# Patient Record
Sex: Female | Born: 1965 | Race: White | Hispanic: No | Smoking: Never smoker
Health system: Southern US, Community
[De-identification: ages and names within clinical notes are randomized; demographics above are authoritative.]

## PROBLEM LIST (undated history)

## (undated) DIAGNOSIS — E785 Hyperlipidemia, unspecified: Secondary | ICD-10-CM

## (undated) DIAGNOSIS — L709 Acne, unspecified: Secondary | ICD-10-CM

## (undated) DIAGNOSIS — T7840XA Allergy, unspecified, initial encounter: Secondary | ICD-10-CM

## (undated) HISTORY — DX: Acne, unspecified: L70.9

## (undated) HISTORY — DX: Allergy, unspecified, initial encounter: T78.40XA

## (undated) HISTORY — DX: Hyperlipidemia, unspecified: E78.5

---

## 1998-07-25 ENCOUNTER — Ambulatory Visit (HOSPITAL_COMMUNITY): Admission: RE | Admit: 1998-07-25 | Discharge: 1998-07-25 | Payer: Self-pay | Admitting: Family Medicine

## 1998-07-26 ENCOUNTER — Ambulatory Visit (HOSPITAL_COMMUNITY): Admission: RE | Admit: 1998-07-26 | Discharge: 1998-07-26 | Payer: Self-pay | Admitting: Obstetrics & Gynecology

## 1998-11-21 ENCOUNTER — Other Ambulatory Visit: Admission: RE | Admit: 1998-11-21 | Discharge: 1998-11-21 | Payer: Self-pay

## 1999-10-02 ENCOUNTER — Ambulatory Visit (HOSPITAL_COMMUNITY): Admission: RE | Admit: 1999-10-02 | Discharge: 1999-10-02 | Payer: Self-pay | Admitting: Family Medicine

## 1999-10-02 ENCOUNTER — Encounter: Payer: Self-pay | Admitting: Family Medicine

## 2000-01-31 ENCOUNTER — Inpatient Hospital Stay (HOSPITAL_COMMUNITY): Admission: AD | Admit: 2000-01-31 | Discharge: 2000-01-31 | Payer: Self-pay | Admitting: Family Medicine

## 2000-01-31 ENCOUNTER — Encounter: Payer: Self-pay | Admitting: Family Medicine

## 2000-02-18 ENCOUNTER — Inpatient Hospital Stay (HOSPITAL_COMMUNITY): Admission: AD | Admit: 2000-02-18 | Discharge: 2000-02-19 | Payer: Self-pay | Admitting: Family Medicine

## 2001-10-27 ENCOUNTER — Other Ambulatory Visit: Admission: RE | Admit: 2001-10-27 | Discharge: 2001-10-27 | Payer: Self-pay | Admitting: Family Medicine

## 2010-02-14 ENCOUNTER — Encounter: Payer: Self-pay | Admitting: Cardiology

## 2010-04-11 ENCOUNTER — Ambulatory Visit (HOSPITAL_COMMUNITY): Admission: RE | Admit: 2010-04-11 | Discharge: 2010-04-11 | Payer: Self-pay | Admitting: *Deleted

## 2010-04-11 ENCOUNTER — Encounter (INDEPENDENT_AMBULATORY_CARE_PROVIDER_SITE_OTHER): Payer: Self-pay | Admitting: *Deleted

## 2010-11-21 NOTE — Letter (Signed)
Summary: [facility]  [facility]   Imported By: Marylou Mccoy 05/17/2010 14:16:24  _____________________________________________________________________  External Attachment:    Type:   Image     Comment:   External Document

## 2010-11-21 NOTE — Letter (Signed)
Summary: Appointment - Missed  [facility]     [place], [place]    Phone:   Fax:      April 11, 2010 MRN: [id number]   Robin Harrington [address] [place], [place]  [address]   Dear Ms. [facility],  Our records indicate you missed your appointment on 03-20-2010  with Dr. Antoine Poche        It is very important that we reach you to reschedule this appointment. We look forward to participating in your health care needs. Please contact us at the number listed above at your earliest convenience to reschedule this appointment.     Sincerely,   Lorne Skeens  [facility] Scheduling Team

## 2014-07-29 ENCOUNTER — Other Ambulatory Visit: Payer: Self-pay | Admitting: Physician Assistant

## 2015-07-14 ENCOUNTER — Ambulatory Visit (INDEPENDENT_AMBULATORY_CARE_PROVIDER_SITE_OTHER): Payer: BC Managed Care – PPO

## 2015-07-14 DIAGNOSIS — Z23 Encounter for immunization: Secondary | ICD-10-CM

## 2016-09-05 ENCOUNTER — Ambulatory Visit (INDEPENDENT_AMBULATORY_CARE_PROVIDER_SITE_OTHER): Payer: BC Managed Care – PPO | Admitting: Physician Assistant

## 2016-09-05 ENCOUNTER — Encounter: Payer: Self-pay | Admitting: Physician Assistant

## 2016-09-05 VITALS — BP 110/72 | HR 50 | Temp 97.0°F | Ht 65.0 in | Wt 184.8 lb

## 2016-09-05 DIAGNOSIS — E782 Mixed hyperlipidemia: Secondary | ICD-10-CM | POA: Diagnosis not present

## 2016-09-05 DIAGNOSIS — L709 Acne, unspecified: Secondary | ICD-10-CM | POA: Diagnosis not present

## 2016-09-05 DIAGNOSIS — Z6830 Body mass index (BMI) 30.0-30.9, adult: Secondary | ICD-10-CM

## 2016-09-05 DIAGNOSIS — R635 Abnormal weight gain: Secondary | ICD-10-CM | POA: Diagnosis not present

## 2016-09-05 NOTE — Patient Instructions (Signed)
Acne Introduction Acne is a skin problem that causes small, red bumps (pimples). Acne happens when the tiny holes in your skin (pores) get blocked. Your pores may become red, sore, and swollen. They may also become infected. Acne is a common skin problem. It is especially common in teenagers. Acne usually goes away over time. Follow these instructions at home: Good skin care is the most important thing you can do to treat your acne. Take care of your skin as told by your doctor. You may be told to do these things:  Wash your skin gently at least two times each day. You should also wash your skin:  After you exercise.  Before you go to bed.  Use mild soap.  Use a water-based skin moisturizer after you wash your skin.  Use a sunscreen or sunblock with SPF 30 or greater. This is very important if you are using acne medicines.  Choose cosmetics that will not plug your oil glands (are noncomedogenic). Medicines  Take over-the-counter and prescription medicines only as told by your doctor.  If you were prescribed an antibiotic medicine, apply or take it as told by your doctor. Do not stop using the antibiotic even if your acne improves. General instructions  Keep your hair clean and off of your face. Shampoo your hair regularly. If you have oily hair, you may need to wash it every day.  Avoid leaning your chin or forehead on your hands.  Avoid wearing tight headbands or hats.  Avoid picking or squeezing your pimples. That can make your acne worse and cause scarring.  Keep all follow-up visits as told by your doctor. This is important.  Shave gently. Only shave when it is necessary.  Keep a food journal. This can help you to see if any foods are linked with your acne. Contact a doctor if:  Your acne is not better after eight weeks.  Your acne gets worse.  You have a large area of skin that is red or tender.  You think that you are having side effects from any acne  medicine. This information is not intended to replace advice given to you by your health care provider. Make sure you discuss any questions you have with your health care provider. Document Released: 09/25/2011 Document Revised: 03/13/2016 Document Reviewed: 12/13/2014  2017 Elsevier

## 2016-09-07 DIAGNOSIS — L709 Acne, unspecified: Secondary | ICD-10-CM | POA: Insufficient documentation

## 2016-09-07 DIAGNOSIS — E782 Mixed hyperlipidemia: Secondary | ICD-10-CM | POA: Insufficient documentation

## 2016-09-07 DIAGNOSIS — Z6830 Body mass index (BMI) 30.0-30.9, adult: Secondary | ICD-10-CM | POA: Insufficient documentation

## 2016-09-07 NOTE — Progress Notes (Signed)
BP 110/72   Pulse (!) 50   Temp 97 F (36.1 C) (Oral)   Ht 5\' 5"  (1.651 m)   Wt 184 lb 12.8 oz (83.8 kg)   BMI 30.75 kg/m    Subjective:    Patient ID: Robin Harrington, female    DOB: 06/04/1966, 50 y.o.   MRN: [id number]  HPI: Robin Harrington is a 50 y.o. female presenting on 09/05/2016 for Follow-up and Establish Care  Patient here to be [facility].  This patient [facility] from [facility]. At this time she is establishing care here and needing medications reviewed. She has been doing very well with her diet and exercise and losing weight. She does need her lipids rechecked soon we'll plan to do that in the near future in order will be placed. She is currently on Crestor 10 mg. Her GERD has been quite well-controlled also on the pantoprazole. She uses the minocycline which she has a flareup of her acne. She uses Flonase through the heavy allergy seasons  Relevant past medical, surgical, family and social history reviewed and updated as indicated. Allergies and medications reviewed and updated.  Past Medical History:  Diagnosis Date  . Acne   . Allergy   . Hyperlipidemia     History reviewed. No pertinent surgical history.  Review of Systems  Constitutional: Negative.  Negative for activity change, fatigue and fever.  HENT: Negative.   Eyes: Negative.   Respiratory: Negative.  Negative for cough.   Cardiovascular: Negative.  Negative for chest pain.  Gastrointestinal: Negative.  Negative for abdominal pain.  Endocrine: Negative.   Genitourinary: Negative.  Negative for dysuria.  Musculoskeletal: Negative.   Skin: Negative.   Neurological: Negative.       Medication List       Accurate as of 09/05/16 11:59 PM. Always use your most recent med list.          fluticasone 50 MCG/ACT nasal spray Commonly known as:  FLONASE Place into both nostrils daily.   minocycline 100 MG capsule Commonly  known as:  MINOCIN,DYNACIN Take 100 mg by mouth daily.   pantoprazole 40 MG tablet Commonly known as:  PROTONIX Take 40 mg by mouth 2 (two) times daily.   rosuvastatin 40 MG tablet Commonly known as:  CRESTOR Take 40 mg by mouth daily.          Objective:    BP 110/72   Pulse (!) 50   Temp 97 F (36.1 C) (Oral)   Ht 5\' 5"  (1.651 m)   Wt 184 lb 12.8 oz (83.8 kg)   BMI 30.75 kg/m   No Known Allergies  Physical Exam  Constitutional: She is oriented to person, place, and time. She appears well-developed and well-nourished.  HENT:  Head: Normocephalic and atraumatic.  Eyes: Conjunctivae and EOM are normal. Pupils are equal, round, and reactive to light.  Neck: Normal range of motion. Neck supple.  Cardiovascular: Normal rate, regular rhythm, normal heart sounds and intact distal pulses.   Pulmonary/Chest: Effort normal and breath sounds normal.  Abdominal: Soft. Bowel sounds are normal.  Neurological: She is alert and oriented to person, place, and time. She has normal reflexes.  Skin: Skin is warm and dry. No rash noted.  Psychiatric: She has a normal mood and affect. Her behavior is normal. Judgment and thought content normal.  Nursing note and vitals reviewed.   No results found for this or  any previous visit.    Assessment & Plan:   1. Mixed hyperlipidemia Crestor 10 mg one daily - Lipid panel; Future  2. Weight gain Diet and exercise doing well  3. Body mass index 30.0-30.9, adult  4. Acne, unspecified acne type Minocycline prn   Continue all other maintenance medications as listed above.  Follow up plan: Return in about 6 months (around 03/05/2017).  Orders Placed This Encounter  Procedures  . Lipid panel    Educational handout given for acne  Remus LofflerAngel S. Tuana Hoheisel PA-C [facility] [address]  [place], [place] [address] [phone]   09/07/2016, 7:51 PM

## 2016-09-08 ENCOUNTER — Telehealth: Payer: Self-pay | Admitting: Physician Assistant

## 2016-10-09 ENCOUNTER — Other Ambulatory Visit: Payer: BC Managed Care – PPO

## 2016-10-09 DIAGNOSIS — E782 Mixed hyperlipidemia: Secondary | ICD-10-CM

## 2016-10-10 LAB — LIPID PANEL
Chol/HDL Ratio: 3.1 ratio units (ref 0.0–4.4)
Cholesterol, Total: 222 mg/dL — ABNORMAL HIGH (ref 100–199)
HDL: 71 mg/dL (ref >39)
LDL Calculated: 129 mg/dL — ABNORMAL HIGH (ref 0–99)
Triglycerides: 112 mg/dL (ref 0–149)
VLDL Cholesterol Cal: 22 mg/dL (ref 5–40)

## 2016-12-02 ENCOUNTER — Telehealth: Payer: Self-pay | Admitting: Physician Assistant

## 2017-02-03 ENCOUNTER — Other Ambulatory Visit: Payer: Self-pay | Admitting: Physician Assistant

## 2017-04-10 ENCOUNTER — Other Ambulatory Visit: Payer: BC Managed Care – PPO

## 2017-04-10 DIAGNOSIS — E782 Mixed hyperlipidemia: Secondary | ICD-10-CM

## 2017-04-10 DIAGNOSIS — Z0000 Encounter for general adult medical examination without abnormal findings: Secondary | ICD-10-CM

## 2017-04-11 LAB — CMP14+EGFR
ALT: 11 IU/L (ref 0–32)
AST: 16 IU/L (ref 0–40)
Albumin/Globulin Ratio: 1.9 (ref 1.2–2.2)
Albumin: 4.3 g/dL (ref 3.5–5.5)
Alkaline Phosphatase: 59 IU/L (ref 39–117)
BUN/Creatinine Ratio: 14 (ref 9–23)
BUN: 12 mg/dL (ref 6–24)
Bilirubin Total: 0.3 mg/dL (ref 0.0–1.2)
CO2: 25 mmol/L (ref 20–29)
Calcium: 9.3 mg/dL (ref 8.7–10.2)
Chloride: 104 mmol/L (ref 96–106)
Creatinine, Ser: 0.88 mg/dL (ref 0.57–1.00)
GFR calc Af Amer: 88 mL/min/{1.73_m2} (ref >59)
GFR calc non Af Amer: 76 mL/min/{1.73_m2} (ref >59)
Globulin, Total: 2.3 g/dL (ref 1.5–4.5)
Glucose: 95 mg/dL (ref 65–99)
Potassium: 4.2 mmol/L (ref 3.5–5.2)
Sodium: 141 mmol/L (ref 134–144)
Total Protein: 6.6 g/dL (ref 6.0–8.5)

## 2017-04-11 LAB — CBC WITH DIFFERENTIAL/PLATELET
Basophils Absolute: 0 10*3/uL (ref 0.0–0.2)
Basos: 0 %
EOS (ABSOLUTE): 0.1 10*3/uL (ref 0.0–0.4)
Eos: 2 %
Hematocrit: 38.7 % (ref 34.0–46.6)
Hemoglobin: 12.7 g/dL (ref 11.1–15.9)
Immature Grans (Abs): 0 10*3/uL (ref 0.0–0.1)
Immature Granulocytes: 0 %
Lymphocytes Absolute: 1.6 10*3/uL (ref 0.7–3.1)
Lymphs: 38 %
MCH: 26.3 pg — ABNORMAL LOW (ref 26.6–33.0)
MCHC: 32.8 g/dL (ref 31.5–35.7)
MCV: 80 fL (ref 79–97)
Monocytes Absolute: 0.3 10*3/uL (ref 0.1–0.9)
Monocytes: 8 %
Neutrophils Absolute: 2.1 10*3/uL (ref 1.4–7.0)
Neutrophils: 52 %
Platelets: 259 10*3/uL (ref 150–379)
RBC: 4.82 x10E6/uL (ref 3.77–5.28)
RDW: 14.2 % (ref 12.3–15.4)
WBC: 4.1 10*3/uL (ref 3.4–10.8)

## 2017-04-11 LAB — LIPID PANEL
Chol/HDL Ratio: 2.5 ratio (ref 0.0–4.4)
Cholesterol, Total: 179 mg/dL (ref 100–199)
HDL: 71 mg/dL (ref >39)
LDL Calculated: 86 mg/dL (ref 0–99)
Triglycerides: 112 mg/dL (ref 0–149)
VLDL Cholesterol Cal: 22 mg/dL (ref 5–40)

## 2017-04-11 LAB — TSH: TSH: 3.02 u[IU]/mL (ref 0.450–4.500)

## 2017-04-17 ENCOUNTER — Encounter: Payer: Self-pay | Admitting: Physician Assistant

## 2017-04-17 ENCOUNTER — Ambulatory Visit (INDEPENDENT_AMBULATORY_CARE_PROVIDER_SITE_OTHER): Payer: BC Managed Care – PPO | Admitting: Physician Assistant

## 2017-04-17 VITALS — BP 117/80 | HR 56 | Temp 97.1°F | Ht 65.0 in | Wt 192.0 lb

## 2017-04-17 DIAGNOSIS — M26609 Unspecified temporomandibular joint disorder, unspecified side: Secondary | ICD-10-CM | POA: Diagnosis not present

## 2017-04-17 DIAGNOSIS — E782 Mixed hyperlipidemia: Secondary | ICD-10-CM | POA: Diagnosis not present

## 2017-04-17 DIAGNOSIS — Z6830 Body mass index (BMI) 30.0-30.9, adult: Secondary | ICD-10-CM | POA: Diagnosis not present

## 2017-04-17 DIAGNOSIS — M542 Cervicalgia: Secondary | ICD-10-CM

## 2017-04-17 MED ORDER — PHENTERMINE-TOPIRAMATE ER 7.5-46 MG PO CP24: 1.0000 | ORAL_CAPSULE | Freq: Every day | ORAL | 2 refills | Status: DC

## 2017-04-17 MED ORDER — CYCLOBENZAPRINE HCL 10 MG PO TABS: 10.0000 mg | ORAL_TABLET | Freq: Three times a day (TID) | ORAL | 1 refills | Status: DC | PRN

## 2017-04-17 NOTE — Patient Instructions (Signed)

## 2017-04-17 NOTE — Progress Notes (Signed)
BP 117/80   Pulse (!) 56   Temp 97.1 F (36.2 C) (Oral)   Ht '5\' 5"'$  (1.651 m)   Wt 192 lb (87.1 kg)   BMI 31.95 kg/m    Subjective:    Patient ID: Robin Harrington, female    DOB: [birth year], 51 y.o.   MRN: [id number]  HPI: Robin Harrington is a 51 y.o. female presenting on 04/17/2017 for Medication Refill  This patient comes in for periodic recheck on medications and conditions including Hyperlipidemia, weight gain, cervical pain, TMJ dysfunction. She is having more pain in her neck. 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 The pain is in the upper back. It hurts when she gets up in the morning. She has tried chiropractic and massage. She's not had a whole lot of relief. She has taken muscle relaxant in the past. She also has known TMJ dysfunction. All of her labs are reviewed today and are doing very well.   All medications are reviewed today. There are no reports of any problems with the medications. All of the medical conditions are reviewed and updated.  Lab work is reviewed and will be ordered as medically necessary. There are no new problems reported with today's visit.   Relevant past medical, surgical, family and social history reviewed and updated as indicated. Allergies and medications reviewed and updated.  Past Medical History:  Diagnosis Date  . Acne   . Allergy   . Hyperlipidemia     History reviewed. No pertinent surgical history.  Review of Systems  Constitutional: Negative.  Negative for activity change, fatigue and fever.  HENT: Negative.   Eyes: Negative.   Respiratory: Negative.  Negative for cough.   Cardiovascular: Negative.  Negative for chest pain.  Gastrointestinal: Negative.  Negative for abdominal pain.  Endocrine: Negative.   Genitourinary: Negative.  Negative for dysuria.  Musculoskeletal: Positive for arthralgias, joint swelling, neck pain and neck stiffness.  Skin: Negative.   Neurological:  Negative.     Allergies as of 04/17/2017   No Known Allergies     Medication List       Accurate as of 04/17/17 10:10 AM. Always use your most recent med list.          cyclobenzaprine 10 MG tablet Commonly known as:  FLEXERIL Take 1 tablet (10 mg total) by mouth 3 (three) times daily as needed for muscle spasms.   fluticasone 50 MCG/ACT nasal spray Commonly known as:  FLONASE Place into both nostrils daily.   pantoprazole 40 MG tablet Commonly known as:  PROTONIX Take 40 mg by mouth 2 (two) times daily.   Phentermine-Topiramate 7.5-46 MG Cp24 Commonly known as:  QSYMIA Take 1 tablet by mouth daily.   rosuvastatin 40 MG tablet Commonly known as:  CRESTOR TAKE ONE (1) TABLET EACH DAY          Objective:    BP 117/80   Pulse (!) 56   Temp 97.1 F (36.2 C) (Oral)   Ht '5\' 5"'$  (1.651 m)   Wt 192 lb (87.1 kg)   BMI 31.95 kg/m   No Known Allergies  Physical Exam  Constitutional: She is oriented to person, place, and time. She appears well-developed and well-nourished.  HENT:  Head: Normocephalic and atraumatic.  Right Ear: Tympanic membrane, external ear and ear canal normal.  Left Ear: Tympanic membrane, external ear and ear canal normal.  Nose: Nose normal. No rhinorrhea.  Mouth/Throat: Oropharynx  is clear and moist and mucous membranes are normal. No oropharyngeal exudate or posterior oropharyngeal erythema.  Eyes: Conjunctivae and EOM are normal. Pupils are equal, round, and reactive to light.  Neck: Normal range of motion. Neck supple.  Cardiovascular: Normal rate, regular rhythm, normal heart sounds and intact distal pulses.   Pulmonary/Chest: Effort normal and breath sounds normal.  Abdominal: Soft. Bowel sounds are normal.  Musculoskeletal: She exhibits tenderness.  Neurological: She is alert and oriented to person, place, and time. She has normal reflexes.  Skin: Skin is warm and dry. No rash noted.  Psychiatric: She has a normal mood and affect. Her  behavior is normal. Judgment and thought content normal.    Results for orders placed or performed in visit on 04/10/17  CBC with Differential/Platelet  Result Value Ref Range   WBC 4.1 3.4 - 10.8 x10E3/uL   RBC 4.82 3.77 - 5.28 x10E6/uL   Hemoglobin 12.7 11.1 - 15.9 g/dL   Hematocrit 50.5 39.7 - 46.6 %   MCV 80 79 - 97 fL   MCH 26.3 (L) 26.6 - 33.0 pg   MCHC 32.8 31.5 - 35.7 g/dL   RDW 67.3 41.9 - 37.9 %   Platelets 259 150 - 379 x10E3/uL   Neutrophils 52 Not Estab. %   Lymphs 38 Not Estab. %   Monocytes 8 Not Estab. %   Eos 2 Not Estab. %   Basos 0 Not Estab. %   Neutrophils Absolute 2.1 1.4 - 7.0 x10E3/uL   Lymphocytes Absolute 1.6 0.7 - 3.1 x10E3/uL   Monocytes Absolute 0.3 0.1 - 0.9 x10E3/uL   EOS (ABSOLUTE) 0.1 0.0 - 0.4 x10E3/uL   Basophils Absolute 0.0 0.0 - 0.2 x10E3/uL   Immature Granulocytes 0 Not Estab. %   Immature Grans (Abs) 0.0 0.0 - 0.1 x10E3/uL  CMP14+EGFR  Result Value Ref Range   Glucose 95 65 - 99 mg/dL   BUN 12 6 - 24 mg/dL   Creatinine, Ser 0.24 0.57 - 1.00 mg/dL   GFR calc non Af Amer 76 >59 mL/min/1.73   GFR calc Af Amer 88 >59 mL/min/1.73   BUN/Creatinine Ratio 14 9 - 23   Sodium 141 134 - 144 mmol/L   Potassium 4.2 3.5 - 5.2 mmol/L   Chloride 104 96 - 106 mmol/L   CO2 25 20 - 29 mmol/L   Calcium 9.3 8.7 - 10.2 mg/dL   Total Protein 6.6 6.0 - 8.5 g/dL   Albumin 4.3 3.5 - 5.5 g/dL   Globulin, Total 2.3 1.5 - 4.5 g/dL   Albumin/Globulin Ratio 1.9 1.2 - 2.2   Bilirubin Total 0.3 0.0 - 1.2 mg/dL   Alkaline Phosphatase 59 39 - 117 IU/L   AST 16 0 - 40 IU/L   ALT 11 0 - 32 IU/L  Lipid panel  Result Value Ref Range   Cholesterol, Total 179 100 - 199 mg/dL   Triglycerides 097 0 - 149 mg/dL   HDL 71 >35 mg/dL   VLDL Cholesterol Cal 22 5 - 40 mg/dL   LDL Calculated 86 0 - 99 mg/dL   Chol/HDL Ratio 2.5 0.0 - 4.4 ratio  TSH  Result Value Ref Range   TSH 3.020 0.450 - 4.500 uIU/mL      Assessment & Plan:   1. Mixed hyperlipidemia  2. Body  mass index 30.0-30.9, adult - Phentermine-Topiramate (QSYMIA) 7.5-46 MG CP24; Take 1 tablet by mouth daily.  Dispense: 30 capsule; Refill: 2  3. Cervical pain - cyclobenzaprine (FLEXERIL) 10 MG  tablet; Take 1 tablet (10 mg total) by mouth 3 (three) times daily as needed for muscle spasms.  Dispense: 90 tablet; Refill: 1  4. TMJ dysfunction - cyclobenzaprine (FLEXERIL) 10 MG tablet; Take 1 tablet (10 mg total) by mouth 3 (three) times daily as needed for muscle spasms.  Dispense: 90 tablet; Refill: 1   Current Outpatient Prescriptions:  .  fluticasone (FLONASE) 50 MCG/ACT nasal spray, Place into both nostrils daily., Disp: , Rfl:  .  pantoprazole (PROTONIX) 40 MG tablet, Take 40 mg by mouth 2 (two) times daily., Disp: , Rfl:  .  rosuvastatin (CRESTOR) 40 MG tablet, TAKE ONE (1) TABLET EACH DAY, Disp: 90 tablet, Rfl: 0 .  cyclobenzaprine (FLEXERIL) 10 MG tablet, Take 1 tablet (10 mg total) by mouth 3 (three) times daily as needed for muscle spasms., Disp: 90 tablet, Rfl: 1 .  Phentermine-Topiramate (QSYMIA) 7.5-46 MG CP24, Take 1 tablet by mouth daily., Disp: 30 capsule, Rfl: 2  Continue all other maintenance medications as listed above.  Follow up plan: Return in about 3 months (around 07/18/2017) for recheck.  Educational handout given for carb counting  Terald Sleeper PA-C [facility] [address]  [place], [place] [address] [phone]   04/17/2017, 10:10 AM

## 2017-07-21 ENCOUNTER — Telehealth: Payer: Self-pay | Admitting: Physician Assistant

## 2017-08-13 ENCOUNTER — Other Ambulatory Visit: Payer: Self-pay | Admitting: Physician Assistant

## 2017-08-13 DIAGNOSIS — M26609 Unspecified temporomandibular joint disorder, unspecified side: Secondary | ICD-10-CM

## 2017-08-13 DIAGNOSIS — M542 Cervicalgia: Secondary | ICD-10-CM

## 2017-09-18 ENCOUNTER — Other Ambulatory Visit: Payer: Self-pay | Admitting: Physician Assistant

## 2017-09-18 DIAGNOSIS — Z6830 Body mass index (BMI) 30.0-30.9, adult: Secondary | ICD-10-CM

## 2017-09-21 MED ORDER — PHENTERMINE-TOPIRAMATE 7.5-46 MG PO CP24
1.0000 | ORAL_CAPSULE | Freq: Every day | ORAL | 0 refills | Status: DC
Start: 2017-09-21 — End: 2017-10-08

## 2017-09-21 NOTE — Telephone Encounter (Signed)
rx called into pharmacy

## 2017-09-21 NOTE — Addendum Note (Signed)
Addended by: Remus LofflerJONES, Cornel Werber S on: 09/21/2017 11:10 AM   Modules accepted: Orders

## 2017-10-08 ENCOUNTER — Encounter: Payer: Self-pay | Admitting: Physician Assistant

## 2017-10-08 ENCOUNTER — Ambulatory Visit (INDEPENDENT_AMBULATORY_CARE_PROVIDER_SITE_OTHER): Payer: BC Managed Care – PPO | Admitting: Physician Assistant

## 2017-10-08 DIAGNOSIS — M26609 Unspecified temporomandibular joint disorder, unspecified side: Secondary | ICD-10-CM

## 2017-10-08 DIAGNOSIS — Z6830 Body mass index (BMI) 30.0-30.9, adult: Secondary | ICD-10-CM | POA: Diagnosis not present

## 2017-10-08 DIAGNOSIS — M542 Cervicalgia: Secondary | ICD-10-CM | POA: Diagnosis not present

## 2017-10-08 MED ORDER — PHENTERMINE-TOPIRAMATE 7.5-46 MG PO CP24
1.0000 | ORAL_CAPSULE | Freq: Every day | ORAL | 5 refills | Status: DC
Start: 2017-10-08 — End: 2018-03-16

## 2017-10-08 MED ORDER — PANTOPRAZOLE SODIUM 40 MG PO TBEC: 40.0000 mg | DELAYED_RELEASE_TABLET | Freq: Two times a day (BID) | ORAL | 3 refills | Status: DC

## 2017-10-08 MED ORDER — ROSUVASTATIN CALCIUM 40 MG PO TABS: ORAL_TABLET | ORAL | 3 refills | Status: DC

## 2017-10-08 MED ORDER — FLUTICASONE PROPIONATE 50 MCG/ACT NA SUSP: 2.0000 | Freq: Every day | NASAL | 11 refills | Status: DC

## 2017-10-08 MED ORDER — CYCLOBENZAPRINE HCL 10 MG PO TABS: 10.0000 mg | ORAL_TABLET | Freq: Three times a day (TID) | ORAL | 3 refills | Status: DC | PRN

## 2017-10-08 NOTE — Patient Instructions (Signed)
In a few days you may receive a survey in the mail or online from [employer] regarding your visit with us today. Please take a moment to fill this out. Your feedback is very important to our whole office. It can help us better understand your needs as well as improve your experience and satisfaction. Thank you for taking your time to complete it. We care about you.  [employer], PA-C  

## 2017-10-13 NOTE — Progress Notes (Signed)
BP (!) 95/58   Pulse (!) 55   Temp 97.6 F (36.4 C) (Oral)   Ht '5\' 5"'$  (1.651 m)   Wt 196 lb 9.6 oz (89.2 kg)   BMI 32.72 kg/m    Subjective:    Patient ID: Robin Harrington, female    DOB: April 28, 1966, 51 y.o.   MRN: [id number]  HPI: Robin Harrington is a 51 y.o. female presenting on 10/08/2017 for Medication Refill  This patient comes in for periodic recheck on medications and conditions including weight counsel, neck pain with TMJ dysfunction. Reports overall doing very well and tolerating all of her medications. Weight stable overall, and under a lot of stressors through work..   All medications are reviewed today. There are no reports of any problems with the medications. All of the medical conditions are reviewed and updated.  Lab work is reviewed and will be ordered as medically necessary. There are no new problems reported with today's visit.   Relevant past medical, surgical, family and social history reviewed and updated as indicated. Allergies and medications reviewed and updated.  Past Medical History:  Diagnosis Date  . Acne   . Allergy   . Hyperlipidemia     History reviewed. No pertinent surgical history.  Review of Systems  Constitutional: Negative.  Negative for activity change, fatigue and fever.  HENT: Negative.   Eyes: Negative.   Respiratory: Negative.  Negative for cough.   Cardiovascular: Negative.  Negative for chest pain.  Gastrointestinal: Negative.  Negative for abdominal pain.  Endocrine: Negative.   Genitourinary: Negative.  Negative for dysuria.  Musculoskeletal: Negative.   Skin: Negative.   Neurological: Negative.     Allergies as of 10/08/2017   No Known Allergies     Medication List        Accurate as of 10/08/17 11:59 PM. Always use your most recent med list.          cyclobenzaprine 10 MG tablet Commonly known as:  FLEXERIL Take 1 tablet (10 mg total) by mouth 3 (three) times daily as needed. for muscle spams     fluticasone 50 MCG/ACT nasal spray Commonly known as:  FLONASE Place 2 sprays into both nostrils daily.   pantoprazole 40 MG tablet Commonly known as:  PROTONIX Take 1 tablet (40 mg total) by mouth 2 (two) times daily.   Phentermine-Topiramate 7.5-46 MG Cp24 Commonly known as:  QSYMIA Take 1 capsule by mouth daily.   rosuvastatin 40 MG tablet Commonly known as:  CRESTOR TAKE ONE (1) TABLET EACH DAY          Objective:    BP (!) 95/58   Pulse (!) 55   Temp 97.6 F (36.4 C) (Oral)   Ht '5\' 5"'$  (1.651 m)   Wt 196 lb 9.6 oz (89.2 kg)   BMI 32.72 kg/m   No Known Allergies  Physical Exam  Constitutional: She is oriented to person, place, and time. She appears well-developed and well-nourished.  HENT:  Head: Normocephalic and atraumatic.  Right Ear: Tympanic membrane, external ear and ear canal normal.  Left Ear: Tympanic membrane, external ear and ear canal normal.  Nose: Nose normal. No rhinorrhea.  Mouth/Throat: Oropharynx is clear and moist and mucous membranes are normal. No oropharyngeal exudate or posterior oropharyngeal erythema.  Eyes: Conjunctivae and EOM are normal. Pupils are equal, round, and reactive to light.  Neck: Normal range of motion. Neck supple.  Cardiovascular: Normal rate, regular rhythm, normal heart sounds and intact distal pulses.  Pulmonary/Chest: Effort normal and breath sounds normal.  Abdominal: Soft. Bowel sounds are normal.  Neurological: She is alert and oriented to person, place, and time. She has normal reflexes.  Skin: Skin is warm and dry. No rash noted.  Psychiatric: She has a normal mood and affect. Her behavior is normal. Judgment and thought content normal.  Nursing note and vitals reviewed.   Results for orders placed or performed in visit on 04/10/17  CBC with Differential/Platelet  Result Value Ref Range   WBC 4.1 3.4 - 10.8 x10E3/uL   RBC 4.82 3.77 - 5.28 x10E6/uL   Hemoglobin 12.7 11.1 - 15.9 g/dL   Hematocrit 38.7 34.0  - 46.6 %   MCV 80 79 - 97 fL   MCH 26.3 (L) 26.6 - 33.0 pg   MCHC 32.8 31.5 - 35.7 g/dL   RDW 14.2 12.3 - 15.4 %   Platelets 259 150 - 379 x10E3/uL   Neutrophils 52 Not Estab. %   Lymphs 38 Not Estab. %   Monocytes 8 Not Estab. %   Eos 2 Not Estab. %   Basos 0 Not Estab. %   Neutrophils Absolute 2.1 1.4 - 7.0 x10E3/uL   Lymphocytes Absolute 1.6 0.7 - 3.1 x10E3/uL   Monocytes Absolute 0.3 0.1 - 0.9 x10E3/uL   EOS (ABSOLUTE) 0.1 0.0 - 0.4 x10E3/uL   Basophils Absolute 0.0 0.0 - 0.2 x10E3/uL   Immature Granulocytes 0 Not Estab. %   Immature Grans (Abs) 0.0 0.0 - 0.1 x10E3/uL  CMP14+EGFR  Result Value Ref Range   Glucose 95 65 - 99 mg/dL   BUN 12 6 - 24 mg/dL   Creatinine, Ser 0.88 0.57 - 1.00 mg/dL   GFR calc non Af Amer 76 >59 mL/min/1.73   GFR calc Af Amer 88 >59 mL/min/1.73   BUN/Creatinine Ratio 14 9 - 23   Sodium 141 134 - 144 mmol/L   Potassium 4.2 3.5 - 5.2 mmol/L   Chloride 104 96 - 106 mmol/L   CO2 25 20 - 29 mmol/L   Calcium 9.3 8.7 - 10.2 mg/dL   Total Protein 6.6 6.0 - 8.5 g/dL   Albumin 4.3 3.5 - 5.5 g/dL   Globulin, Total 2.3 1.5 - 4.5 g/dL   Albumin/Globulin Ratio 1.9 1.2 - 2.2   Bilirubin Total 0.3 0.0 - 1.2 mg/dL   Alkaline Phosphatase 59 39 - 117 IU/L   AST 16 0 - 40 IU/L   ALT 11 0 - 32 IU/L  Lipid panel  Result Value Ref Range   Cholesterol, Total 179 100 - 199 mg/dL   Triglycerides 112 0 - 149 mg/dL   HDL 71 >39 mg/dL   VLDL Cholesterol Cal 22 5 - 40 mg/dL   LDL Calculated 86 0 - 99 mg/dL   Chol/HDL Ratio 2.5 0.0 - 4.4 ratio  TSH  Result Value Ref Range   TSH 3.020 0.450 - 4.500 uIU/mL      Assessment & Plan:   1. Body mass index 30.0-30.9, adult - Phentermine-Topiramate (QSYMIA) 7.5-46 MG CP24; Take 1 capsule by mouth daily.  Dispense: 30 capsule; Refill: 5  2. Cervical pain - cyclobenzaprine (FLEXERIL) 10 MG tablet; Take 1 tablet (10 mg total) by mouth 3 (three) times daily as needed. for muscle spams  Dispense: 90 tablet; Refill: 3  3.  TMJ dysfunction - cyclobenzaprine (FLEXERIL) 10 MG tablet; Take 1 tablet (10 mg total) by mouth 3 (three) times daily as needed. for muscle spams  Dispense: 90 tablet; Refill: 3  Current Outpatient Medications:  .  cyclobenzaprine (FLEXERIL) 10 MG tablet, Take 1 tablet (10 mg total) by mouth 3 (three) times daily as needed. for muscle spams, Disp: 90 tablet, Rfl: 3 .  fluticasone (FLONASE) 50 MCG/ACT nasal spray, Place 2 sprays into both nostrils daily., Disp: 16 g, Rfl: 11 .  pantoprazole (PROTONIX) 40 MG tablet, Take 1 tablet (40 mg total) by mouth 2 (two) times daily., Disp: 90 tablet, Rfl: 3 .  Phentermine-Topiramate (QSYMIA) 7.5-46 MG CP24, Take 1 capsule by mouth daily., Disp: 30 capsule, Rfl: 5 .  rosuvastatin (CRESTOR) 40 MG tablet, TAKE ONE (1) TABLET EACH DAY, Disp: 90 tablet, Rfl: 3 Continue all other maintenance medications as listed above.  Follow up plan: Return in about 6 months (around 04/08/2018) for recheck.  Educational handout given for [place] PA-C [facility] [address]  [place], [place] [address] [phone]   10/13/2017, 9:43 PM

## 2018-01-04 ENCOUNTER — Telehealth: Payer: Self-pay | Admitting: Physician Assistant

## 2018-01-04 ENCOUNTER — Other Ambulatory Visit: Payer: Self-pay | Admitting: Physician Assistant

## 2018-01-04 MED ORDER — SULFAMETHOXAZOLE-TRIMETHOPRIM 800-160 MG PO TABS: 1.0000 | ORAL_TABLET | Freq: Two times a day (BID) | ORAL | 0 refills | Status: DC

## 2018-01-04 NOTE — Telephone Encounter (Signed)
Mom aware.

## 2018-01-04 NOTE — Telephone Encounter (Signed)
Bactrim DS 1 twice daily 1 week is sent to pharmacy

## 2018-03-04 ENCOUNTER — Ambulatory Visit (INDEPENDENT_AMBULATORY_CARE_PROVIDER_SITE_OTHER): Payer: BC Managed Care – PPO | Admitting: Physician Assistant

## 2018-03-04 ENCOUNTER — Encounter: Payer: Self-pay | Admitting: Physician Assistant

## 2018-03-04 ENCOUNTER — Ambulatory Visit: Payer: BC Managed Care – PPO | Admitting: Physician Assistant

## 2018-03-04 VITALS — BP 133/85 | HR 97 | Temp 97.9°F | Ht 65.0 in | Wt 197.0 lb

## 2018-03-04 DIAGNOSIS — J069 Acute upper respiratory infection, unspecified: Secondary | ICD-10-CM | POA: Diagnosis not present

## 2018-03-04 MED ORDER — AMOXICILLIN 875 MG PO TABS: 875.0000 mg | ORAL_TABLET | Freq: Two times a day (BID) | ORAL | 0 refills | Status: DC

## 2018-03-04 NOTE — Progress Notes (Signed)
  Subjective:     Patient ID: Robin Harrington, female   DOB: [birth year], 52 y.o.   MRN: [id number]  HPI Prod cough and head congestion for 3-4 days  Review of Systems  Constitutional: Positive for activity change, chills and fatigue. Negative for appetite change and fever.  HENT: Positive for congestion, postnasal drip, sinus pressure, sore throat and voice change. Negative for ear discharge, ear pain, nosebleeds, rhinorrhea and sinus pain.   Eyes: Negative.   Respiratory: Positive for cough. Negative for shortness of breath and wheezing.   Cardiovascular: Negative.        Objective:   Physical Exam  Constitutional: She appears well-developed and well-nourished.  HENT:  Right Ear: External ear normal.  Left Ear: External ear normal.  Mouth/Throat: Oropharynx is clear and moist. No oropharyngeal exudate.  Ears- cansl nl bilat TM's with fluids but nl landmarks bilat  Neck: Neck supple.  Cardiovascular: Normal rate, regular rhythm and normal heart sounds.  Pulmonary/Chest: Effort normal and breath sounds normal.  Coarse lung sounds that clear with cough  Lymphadenopathy:    She has no cervical adenopathy.  Nursing note and vitals reviewed.      Assessment:     1. Upper respiratory tract infection, unspecified type        Plan:     Amox  bid x 10 days Fluids  Rest OTC antihist /decongest F/U prn

## 2018-03-04 NOTE — Patient Instructions (Signed)
Upper Respiratory Infection, Adult Most upper respiratory infections (URIs) are caused by a virus. A URI affects the nose, throat, and upper air passages. The most common type of URI is often called "the common cold." Follow these instructions at home:  Take medicines only as told by your doctor.  Gargle warm saltwater or take cough drops to comfort your throat as told by your doctor.  Use a warm mist humidifier or inhale steam from a shower to increase air moisture. This may make it easier to breathe.  Drink enough fluid to keep your pee (urine) clear or pale yellow.  Eat soups and other clear broths.  Have a healthy diet.  Rest as needed.  Go back to work when your fever is gone or your doctor says it is okay. ? You may need to stay home longer to avoid giving your URI to others. ? You can also wear a face mask and wash your hands often to prevent spread of the virus.  Use your inhaler more if you have asthma.  Do not use any tobacco products, including cigarettes, chewing tobacco, or electronic cigarettes. If you need help quitting, ask your doctor. Contact a doctor if:  You are getting worse, not better.  Your symptoms are not helped by medicine.  You have chills.  You are getting more short of breath.  You have brown or red mucus.  You have yellow or brown discharge from your nose.  You have pain in your face, especially when you bend forward.  You have a fever.  You have puffy (swollen) neck glands.  You have pain while swallowing.  You have white areas in the back of your throat. Get help right away if:  You have very bad or constant: ? Headache. ? Ear pain. ? Pain in your forehead, behind your eyes, and over your cheekbones (sinus pain). ? Chest pain.  You have long-lasting (chronic) lung disease and any of the following: ? Wheezing. ? Long-lasting cough. ? Coughing up blood. ? A change in your usual mucus.  You have a stiff neck.  You have  changes in your: ? Vision. ? Hearing. ? Thinking. ? Mood. This information is not intended to replace advice given to you by your health care provider. Make sure you discuss any questions you have with your health care provider. Document Released: 03/24/2008 Document Revised: 06/08/2016 Document Reviewed: 01/11/2014 Elsevier Interactive Patient Education  2018 Elsevier Inc.  

## 2018-03-08 ENCOUNTER — Other Ambulatory Visit: Payer: Self-pay | Admitting: Physician Assistant

## 2018-03-08 ENCOUNTER — Telehealth: Payer: Self-pay | Admitting: Physician Assistant

## 2018-03-08 MED ORDER — SULFAMETHOXAZOLE-TRIMETHOPRIM 800-160 MG PO TABS: 1.0000 | ORAL_TABLET | Freq: Two times a day (BID) | ORAL | 0 refills | Status: DC

## 2018-03-08 NOTE — Telephone Encounter (Signed)
Please review and advise.

## 2018-03-08 NOTE — Telephone Encounter (Signed)
Left detailed message on patients voicemail that rx sent to pharmacy 

## 2018-03-08 NOTE — Telephone Encounter (Signed)
I have [facility]rim [facility]

## 2018-03-16 ENCOUNTER — Ambulatory Visit: Payer: BC Managed Care – PPO | Admitting: Physician Assistant

## 2018-03-16 ENCOUNTER — Encounter: Payer: Self-pay | Admitting: Physician Assistant

## 2018-03-16 VITALS — BP 108/75 | HR 61 | Temp 97.6°F | Ht 65.0 in | Wt 197.0 lb

## 2018-03-16 DIAGNOSIS — M26609 Unspecified temporomandibular joint disorder, unspecified side: Secondary | ICD-10-CM

## 2018-03-16 DIAGNOSIS — J321 Chronic frontal sinusitis: Secondary | ICD-10-CM | POA: Diagnosis not present

## 2018-03-16 DIAGNOSIS — Z6830 Body mass index (BMI) 30.0-30.9, adult: Secondary | ICD-10-CM

## 2018-03-16 DIAGNOSIS — M542 Cervicalgia: Secondary | ICD-10-CM

## 2018-03-16 DIAGNOSIS — Z0000 Encounter for general adult medical examination without abnormal findings: Secondary | ICD-10-CM

## 2018-03-16 MED ORDER — PHENTERMINE-TOPIRAMATE ER 7.5-46 MG PO CP24: 1.0000 | ORAL_CAPSULE | Freq: Every day | ORAL | 5 refills | Status: DC

## 2018-03-16 MED ORDER — SULFAMETHOXAZOLE-TRIMETHOPRIM 800-160 MG PO TABS: 1.0000 | ORAL_TABLET | Freq: Two times a day (BID) | ORAL | 0 refills | Status: DC

## 2018-03-16 MED ORDER — CYCLOBENZAPRINE HCL 10 MG PO TABS: 10.0000 mg | ORAL_TABLET | Freq: Three times a day (TID) | ORAL | 3 refills | Status: DC | PRN

## 2018-03-16 MED ORDER — PREDNISONE 10 MG (21) PO TBPK: ORAL_TABLET | ORAL | 0 refills | Status: DC

## 2018-03-16 NOTE — Progress Notes (Signed)
BP 108/75   Pulse 61   Temp 97.6 F (36.4 C)   Ht '5\' 5"'$  (1.651 m)   Wt 197 lb (89.4 kg)   BMI 32.78 kg/m    Subjective:    Patient ID: Robin Harrington, female    DOB: 11-Nov-1965, 52 y.o.   MRN: [id number]  HPI: Robin Harrington is a 52 y.o. female presenting on 03/16/2018 for Follow-up  This patient comes in for chronic recheck on her chronic medical conditions which includes TMJ dysfunction, cervical pain, hyperlipidemia.  She is doing very well and her tolerating her medications very well.  She has been having more sinus infections over the past few months.  She is taking some medicine did not completely felt better.  This patient has had many days of sinus headache and postnasal drainage. There is copious drainage at times. Denies any fever at this time. There has been a history of sinus infections in the past.  No history of sinus surgery. There is cough at night. It has become more prevalent in recent days.   Past Medical History:  Diagnosis Date  . Acne   . Allergy   . Hyperlipidemia    Relevant past medical, surgical, family and social history reviewed and updated as indicated. Interim medical history since our last visit reviewed. Allergies and medications reviewed and updated. DATA REVIEWED: CHART IN EPIC  Family History reviewed for pertinent findings.  Review of Systems  Constitutional: Positive for fatigue. Negative for activity change, appetite change, chills and fever.  HENT: Positive for congestion, postnasal drip, sinus pressure, sinus pain and sore throat.   Eyes: Negative.   Respiratory: Negative for cough and wheezing.   Cardiovascular: Negative.  Negative for chest pain, palpitations and leg swelling.  Gastrointestinal: Negative.   Genitourinary: Negative.   Musculoskeletal: Negative.   Skin: Negative.   Neurological: Positive for headaches.    Allergies as of 03/16/2018   No Known Allergies     Medication List        Accurate as of 03/16/18   9:33 AM. Always use your most recent med list.          cyclobenzaprine 10 MG tablet Commonly known as:  FLEXERIL Take 1 tablet (10 mg total) by mouth 3 (three) times daily as needed. for muscle spams   fluticasone 50 MCG/ACT nasal spray Commonly known as:  FLONASE Place 2 sprays into both nostrils daily.   pantoprazole 40 MG tablet Commonly known as:  PROTONIX Take 1 tablet (40 mg total) by mouth 2 (two) times daily.   Phentermine-Topiramate 7.5-46 MG Cp24 Commonly known as:  QSYMIA Take 1 capsule by mouth daily.   predniSONE 10 MG (21) Tbpk tablet Commonly known as:  STERAPRED UNI-PAK 21 TAB As directed x 6 days   rosuvastatin 40 MG tablet Commonly known as:  CRESTOR TAKE ONE (1) TABLET EACH DAY   sulfamethoxazole-trimethoprim 800-160 MG tablet Commonly known as:  BACTRIM DS,SEPTRA DS Take 1 tablet by mouth 2 (two) times daily.          Objective:    BP 108/75   Pulse 61   Temp 97.6 F (36.4 C)   Ht '5\' 5"'$  (1.651 m)   Wt 197 lb (89.4 kg)   BMI 32.78 kg/m   No Known Allergies  Wt Readings from Last 3 Encounters:  03/16/18 197 lb (89.4 kg)  03/04/18 197 lb (89.4 kg)  10/08/17 196 lb 9.6 oz (89.2 kg)    Physical Exam  Constitutional: She is oriented to person, place, and time. She appears well-developed and well-nourished.  HENT:  Head: Normocephalic and atraumatic.  Right Ear: Tympanic membrane and external ear normal. No middle ear effusion.  Left Ear: Tympanic membrane and external ear normal.  No middle ear effusion.  Nose: Mucosal edema and rhinorrhea present. Right sinus exhibits no maxillary sinus tenderness. Left sinus exhibits no maxillary sinus tenderness.  Mouth/Throat: Uvula is midline. Posterior oropharyngeal erythema present.  Eyes: Pupils are equal, round, and reactive to light. Conjunctivae and EOM are normal. Right eye exhibits no discharge. Left eye exhibits no discharge.  Neck: Normal range of motion.  Cardiovascular: Normal rate,  regular rhythm and normal heart sounds.  Pulmonary/Chest: Effort normal and breath sounds normal. No respiratory distress. She has no wheezes.  Abdominal: Soft.  Lymphadenopathy:    She has no cervical adenopathy.  Neurological: She is alert and oriented to person, place, and time.  Skin: Skin is warm and dry.  Psychiatric: She has a normal mood and affect.        Assessment & Plan:   1. Cervical pain - cyclobenzaprine (FLEXERIL) 10 MG tablet; Take 1 tablet (10 mg total) by mouth 3 (three) times daily as needed. for muscle spams  Dispense: 90 tablet; Refill: 3  2. TMJ dysfunction - cyclobenzaprine (FLEXERIL) 10 MG tablet; Take 1 tablet (10 mg total) by mouth 3 (three) times daily as needed. for muscle spams  Dispense: 90 tablet; Refill: 3  3. Body mass index 30.0-30.9, adult - Phentermine-Topiramate (QSYMIA) 7.5-46 MG CP24; Take 1 capsule by mouth daily.  Dispense: 30 capsule; Refill: 5  4. Chronic frontal sinusitis - sulfamethoxazole-trimethoprim (BACTRIM DS,SEPTRA DS) 800-160 MG tablet; Take 1 tablet by mouth 2 (two) times daily.  Dispense: 40 tablet; Refill: 0 - predniSONE (STERAPRED UNI-PAK 21 TAB) 10 MG (21) TBPK tablet; As directed x 6 days  Dispense: 21 tablet; Refill: 0  5. Well adult exam - CBC with Differential/Platelet - CMP14+EGFR - Lipid panel - TSH   Continue all other maintenance medications as listed above.  Follow up plan: Return in about 6 months (around 09/16/2018).  Educational handout given for Barre PA-C [place] [address]  [place], [place] [address] [phone]   03/16/2018, 9:33 AM

## 2018-03-17 ENCOUNTER — Other Ambulatory Visit: Payer: Self-pay | Admitting: Physician Assistant

## 2018-03-17 DIAGNOSIS — Z6830 Body mass index (BMI) 30.0-30.9, adult: Secondary | ICD-10-CM

## 2018-03-17 LAB — CMP14+EGFR
ALT: 13 IU/L (ref 0–32)
AST: 16 IU/L (ref 0–40)
Albumin/Globulin Ratio: 1.8 (ref 1.2–2.2)
Albumin: 4.2 g/dL (ref 3.5–5.5)
Alkaline Phosphatase: 61 IU/L (ref 39–117)
BUN/Creatinine Ratio: 14 (ref 9–23)
BUN: 11 mg/dL (ref 6–24)
Bilirubin Total: 0.2 mg/dL (ref 0.0–1.2)
CO2: 25 mmol/L (ref 20–29)
Calcium: 9.3 mg/dL (ref 8.7–10.2)
Chloride: 99 mmol/L (ref 96–106)
Creatinine, Ser: 0.76 mg/dL (ref 0.57–1.00)
GFR calc Af Amer: 104 mL/min/{1.73_m2} (ref >59)
GFR calc non Af Amer: 90 mL/min/{1.73_m2} (ref >59)
Globulin, Total: 2.4 g/dL (ref 1.5–4.5)
Glucose: 97 mg/dL (ref 65–99)
Potassium: 4.3 mmol/L (ref 3.5–5.2)
Sodium: 141 mmol/L (ref 134–144)
Total Protein: 6.6 g/dL (ref 6.0–8.5)

## 2018-03-17 LAB — CBC WITH DIFFERENTIAL/PLATELET
Basophils Absolute: 0 10*3/uL (ref 0.0–0.2)
Basos: 1 %
EOS (ABSOLUTE): 0.1 10*3/uL (ref 0.0–0.4)
Eos: 2 %
Hematocrit: 38.2 % (ref 34.0–46.6)
Hemoglobin: 12.8 g/dL (ref 11.1–15.9)
Immature Grans (Abs): 0 10*3/uL (ref 0.0–0.1)
Immature Granulocytes: 0 %
Lymphocytes Absolute: 1.6 10*3/uL (ref 0.7–3.1)
Lymphs: 44 %
MCH: 26.8 pg (ref 26.6–33.0)
MCHC: 33.5 g/dL (ref 31.5–35.7)
MCV: 80 fL (ref 79–97)
Monocytes Absolute: 0.3 10*3/uL (ref 0.1–0.9)
Monocytes: 8 %
Neutrophils Absolute: 1.6 10*3/uL (ref 1.4–7.0)
Neutrophils: 45 %
Platelets: 333 10*3/uL (ref 150–450)
RBC: 4.77 x10E6/uL (ref 3.77–5.28)
RDW: 14.1 % (ref 12.3–15.4)
WBC: 3.6 10*3/uL (ref 3.4–10.8)

## 2018-03-17 LAB — LIPID PANEL
Chol/HDL Ratio: 4.7 ratio — ABNORMAL HIGH (ref 0.0–4.4)
Cholesterol, Total: 297 mg/dL — ABNORMAL HIGH (ref 100–199)
HDL: 63 mg/dL (ref >39)
LDL Calculated: 205 mg/dL — ABNORMAL HIGH (ref 0–99)
Triglycerides: 143 mg/dL (ref 0–149)
VLDL Cholesterol Cal: 29 mg/dL (ref 5–40)

## 2018-03-17 LAB — TSH: TSH: 2.33 u[IU]/mL (ref 0.450–4.500)

## 2018-03-17 MED ORDER — PHENTERMINE-TOPIRAMATE ER 7.5-46 MG PO CP24: 1.0000 | ORAL_CAPSULE | Freq: Every day | ORAL | 5 refills | Status: DC

## 2018-04-20 ENCOUNTER — Other Ambulatory Visit: Payer: Self-pay | Admitting: Physician Assistant

## 2018-04-20 ENCOUNTER — Telehealth: Payer: Self-pay | Admitting: Physician Assistant

## 2018-04-20 DIAGNOSIS — Z6830 Body mass index (BMI) 30.0-30.9, adult: Secondary | ICD-10-CM

## 2018-04-20 MED ORDER — PHENTERMINE-TOPIRAMATE ER 7.5-46 MG PO CP24: 1.0000 | ORAL_CAPSULE | Freq: Every day | ORAL | 5 refills | Status: DC

## 2018-04-20 NOTE — Telephone Encounter (Signed)
Needs refills sent to [facility]

## 2018-04-20 NOTE — Telephone Encounter (Signed)
sent 

## 2018-07-23 ENCOUNTER — Ambulatory Visit: Payer: BC Managed Care – PPO | Admitting: Physician Assistant

## 2018-09-20 ENCOUNTER — Other Ambulatory Visit: Payer: Self-pay | Admitting: Physician Assistant

## 2018-09-21 ENCOUNTER — Other Ambulatory Visit: Payer: Self-pay | Admitting: Physician Assistant

## 2018-11-30 ENCOUNTER — Other Ambulatory Visit: Payer: Self-pay | Admitting: Physician Assistant

## 2018-11-30 DIAGNOSIS — Z6830 Body mass index (BMI) 30.0-30.9, adult: Secondary | ICD-10-CM

## 2018-11-30 NOTE — Telephone Encounter (Signed)
PT aware that she will have to be seen in order to get a refill on medication, verbalized understanding states that due to job she will have to wait until April apt.

## 2018-11-30 NOTE — Telephone Encounter (Signed)
[id number]-cb 2/11

## 2018-11-30 NOTE — Telephone Encounter (Signed)
PT is scheduled to see AJ 4/6 for med check, pt is wanting to know if AJ can send in Phentermine-Topiramate (QSYMIA) 7.5-46 MG CP24 to [facility] [place] ([place] until she can come in for her apt

## 2018-11-30 NOTE — Telephone Encounter (Signed)
Since this is a controlled medication must have office visits to refill

## 2018-12-10 ENCOUNTER — Encounter: Payer: Self-pay | Admitting: Physician Assistant

## 2018-12-10 ENCOUNTER — Ambulatory Visit: Payer: BC Managed Care – PPO | Admitting: Physician Assistant

## 2018-12-10 VITALS — BP 112/74 | HR 92 | Temp 98.3°F | Ht 65.0 in | Wt 188.6 lb

## 2018-12-10 DIAGNOSIS — Z6830 Body mass index (BMI) 30.0-30.9, adult: Secondary | ICD-10-CM

## 2018-12-10 DIAGNOSIS — R300 Dysuria: Secondary | ICD-10-CM

## 2018-12-10 DIAGNOSIS — N3000 Acute cystitis without hematuria: Secondary | ICD-10-CM | POA: Diagnosis not present

## 2018-12-10 LAB — URINALYSIS, COMPLETE
Bilirubin, UA: NEGATIVE
Glucose, UA: NEGATIVE
Ketones, UA: NEGATIVE
Nitrite, UA: NEGATIVE
Protein, UA: NEGATIVE
Specific Gravity, UA: 1.02 (ref 1.005–1.030)
Urobilinogen, Ur: 0.2 mg/dL (ref 0.2–1.0)
pH, UA: 5.5 (ref 5.0–7.5)

## 2018-12-10 LAB — MICROSCOPIC EXAMINATION
Renal Epithel, UA: NONE SEEN /hpf
WBC, UA: >30 /hpf — AB (ref 0–5)

## 2018-12-10 MED ORDER — SULFAMETHOXAZOLE-TRIMETHOPRIM 800-160 MG PO TABS: 1.0000 | ORAL_TABLET | Freq: Two times a day (BID) | ORAL | 0 refills | Status: DC

## 2018-12-10 MED ORDER — PHENTERMINE-TOPIRAMATE ER 7.5-46 MG PO CP24: 1.0000 | ORAL_CAPSULE | Freq: Every day | ORAL | 2 refills | Status: DC

## 2018-12-10 NOTE — Progress Notes (Signed)
BP 112/74   Pulse 92   Temp 98.3 F (36.8 C) (Oral)   Ht '5\' 5"'$  (1.651 m)   Wt 188 lb 9.6 oz (85.5 kg)   BMI 31.38 kg/m    Subjective:    Patient ID: Robin Harrington, female    DOB: 02/28/1966, 53 y.o.   MRN: [id number]  HPI: Robin Harrington is a 53 y.o. female presenting on 12/10/2018 for Urinary Tract Infection  This patient has had several days of dysuria, frequency and nocturia. There is also pain over the bladder in the suprapubic region, no back pain. Denies leakage or hematuria.  Denies fever or chills. No pain in flank area.  She also need refill on Qysmia, still exercising 4-5 days per week. She has been working hard at keeping her weight off.  Past Medical History:  Diagnosis Date  . Acne   . Allergy   . Hyperlipidemia    Relevant past medical, surgical, family and social history reviewed and updated as indicated. Interim medical history since our last visit reviewed. Allergies and medications reviewed and updated. DATA REVIEWED: CHART IN EPIC  Family History reviewed for pertinent findings.  Review of Systems  Constitutional: Negative.   HENT: Negative.   Eyes: Negative.   Respiratory: Negative.   Gastrointestinal: Negative.   Genitourinary: Positive for difficulty urinating, dysuria and urgency. Negative for flank pain.    Allergies as of 12/10/2018   No Known Allergies     Medication List       Accurate as of December 10, 2018  1:47 PM. Always use your most recent med list.        cyclobenzaprine 10 MG tablet Commonly known as:  FLEXERIL Take 1 tablet (10 mg total) by mouth 3 (three) times daily as needed. for muscle spams   fluticasone 50 MCG/ACT nasal spray Commonly known as:  FLONASE Place 2 sprays into both nostrils daily.   pantoprazole 40 MG tablet Commonly known as:  PROTONIX Take 1 tablet (40 mg total) by mouth 2 (two) times daily.   Phentermine-Topiramate 7.5-46 MG Cp24 Commonly known as:  QSYMIA Take 1 capsule by mouth daily.   rosuvastatin 40 MG tablet Commonly known as:  CRESTOR TAKE ONE (1) TABLET EACH DAY   sulfamethoxazole-trimethoprim 800-160 MG tablet Commonly known as:  BACTRIM DS Take 1 tablet by mouth 2 (two) times daily.          Objective:    BP 112/74   Pulse 92   Temp 98.3 F (36.8 C) (Oral)   Ht '5\' 5"'$  (1.651 m)   Wt 188 lb 9.6 oz (85.5 kg)   BMI 31.38 kg/m   No Known Allergies  Wt Readings from Last 3 Encounters:  12/10/18 188 lb 9.6 oz (85.5 kg)  03/16/18 197 lb (89.4 kg)  03/04/18 197 lb (89.4 kg)    Physical Exam Constitutional:      Appearance: She is well-developed.  HENT:     Head: Normocephalic and atraumatic.  Eyes:     Conjunctiva/sclera: Conjunctivae normal.     Pupils: Pupils are equal, round, and reactive to light.  Cardiovascular:     Rate and Rhythm: Normal rate and regular rhythm.     Heart sounds: Normal heart sounds.  Pulmonary:     Effort: Pulmonary effort is normal.     Breath sounds: Normal breath sounds.  Abdominal:     General: Bowel sounds are normal. There is no distension.     Palpations: Abdomen is soft.  There is no mass.     Tenderness: There is abdominal tenderness in the suprapubic area. There is no guarding or rebound.  Skin:    General: Skin is warm and dry.     Findings: No rash.  Neurological:     Mental Status: She is alert and oriented to person, place, and time.     Deep Tendon Reflexes: Reflexes are normal and symmetric.  Psychiatric:        Behavior: Behavior normal.        Thought Content: Thought content normal.        Judgment: Judgment normal.     Results for orders placed or performed in visit on 03/16/18  CBC with Differential/Platelet  Result Value Ref Range   WBC 3.6 3.4 - 10.8 x10E3/uL   RBC 4.77 3.77 - 5.28 x10E6/uL   Hemoglobin 12.8 11.1 - 15.9 g/dL   Hematocrit 38.2 34.0 - 46.6 %   MCV 80 79 - 97 fL   MCH 26.8 26.6 - 33.0 pg   MCHC 33.5 31.5 - 35.7 g/dL   RDW 14.1 12.3 - 15.4 %   Platelets 333 150 - 450  x10E3/uL   Neutrophils 45 Not Estab. %   Lymphs 44 Not Estab. %   Monocytes 8 Not Estab. %   Eos 2 Not Estab. %   Basos 1 Not Estab. %   Neutrophils Absolute 1.6 1.4 - 7.0 x10E3/uL   Lymphocytes Absolute 1.6 0.7 - 3.1 x10E3/uL   Monocytes Absolute 0.3 0.1 - 0.9 x10E3/uL   EOS (ABSOLUTE) 0.1 0.0 - 0.4 x10E3/uL   Basophils Absolute 0.0 0.0 - 0.2 x10E3/uL   Immature Granulocytes 0 Not Estab. %   Immature Grans (Abs) 0.0 0.0 - 0.1 x10E3/uL  CMP14+EGFR  Result Value Ref Range   Glucose 97 65 - 99 mg/dL   BUN 11 6 - 24 mg/dL   Creatinine, Ser 0.76 0.57 - 1.00 mg/dL   GFR calc non Af Amer 90 >59 mL/min/1.73   GFR calc Af Amer 104 >59 mL/min/1.73   BUN/Creatinine Ratio 14 9 - 23   Sodium 141 134 - 144 mmol/L   Potassium 4.3 3.5 - 5.2 mmol/L   Chloride 99 96 - 106 mmol/L   CO2 25 20 - 29 mmol/L   Calcium 9.3 8.7 - 10.2 mg/dL   Total Protein 6.6 6.0 - 8.5 g/dL   Albumin 4.2 3.5 - 5.5 g/dL   Globulin, Total 2.4 1.5 - 4.5 g/dL   Albumin/Globulin Ratio 1.8 1.2 - 2.2   Bilirubin Total 0.2 0.0 - 1.2 mg/dL   Alkaline Phosphatase 61 39 - 117 IU/L   AST 16 0 - 40 IU/L   ALT 13 0 - 32 IU/L  Lipid panel  Result Value Ref Range   Cholesterol, Total 297 (H) 100 - 199 mg/dL   Triglycerides 143 0 - 149 mg/dL   HDL 63 >39 mg/dL   VLDL Cholesterol Cal 29 5 - 40 mg/dL   LDL Calculated 205 (H) 0 - 99 mg/dL   Comment: Comment    Chol/HDL Ratio 4.7 (H) 0.0 - 4.4 ratio  TSH  Result Value Ref Range   TSH 2.330 0.450 - 4.500 uIU/mL      Assessment & Plan:   1. Dysuria - Urinalysis, Complete - Urine Culture  2. Acute cystitis without hematuria - sulfamethoxazole-trimethoprim (BACTRIM DS) 800-160 MG tablet; Take 1 tablet by mouth 2 (two) times daily.  Dispense: 20 tablet; Refill: 0  3. Body mass index 30.0-30.9,  adult - Phentermine-Topiramate (QSYMIA) 7.5-46 MG CP24; Take 1 capsule by mouth daily.  Dispense: 30 capsule; Refill: 2   Continue all other maintenance medications as listed  above.  Follow up plan: No follow-ups on file.  Educational handout given for [place] PA-C [place] [address]  [place], [place] [address] [phone]   12/10/2018, 1:47 PM

## 2018-12-12 LAB — URINE CULTURE

## 2018-12-27 ENCOUNTER — Other Ambulatory Visit: Payer: Self-pay | Admitting: Physician Assistant

## 2019-01-24 ENCOUNTER — Encounter: Payer: Self-pay | Admitting: Physician Assistant

## 2019-01-24 ENCOUNTER — Other Ambulatory Visit: Payer: Self-pay

## 2019-01-24 ENCOUNTER — Ambulatory Visit (INDEPENDENT_AMBULATORY_CARE_PROVIDER_SITE_OTHER): Payer: BC Managed Care – PPO | Admitting: Physician Assistant

## 2019-01-24 DIAGNOSIS — Z6830 Body mass index (BMI) 30.0-30.9, adult: Secondary | ICD-10-CM | POA: Diagnosis not present

## 2019-01-24 DIAGNOSIS — B001 Herpesviral vesicular dermatitis: Secondary | ICD-10-CM | POA: Diagnosis not present

## 2019-01-24 DIAGNOSIS — Z0000 Encounter for general adult medical examination without abnormal findings: Secondary | ICD-10-CM | POA: Diagnosis not present

## 2019-01-24 MED ORDER — VALACYCLOVIR HCL 1 G PO TABS: 1000.0000 mg | ORAL_TABLET | Freq: Every day | ORAL | 11 refills | Status: DC

## 2019-01-24 MED ORDER — PHENTERMINE-TOPIRAMATE ER 7.5-46 MG PO CP24: 1.0000 | ORAL_CAPSULE | Freq: Every day | ORAL | 2 refills | Status: DC

## 2019-01-24 NOTE — Progress Notes (Signed)
   Telephone visit  Subjective: CC:recheck weight, stress PCP: Jones, Angel S, PA-C HPI:Robin Harrington is a 53 y.o. female calls for telephone consult today. Patient provides verbal consent for consult held via phone.  Patient is identified with 2 separate identifiers.  At this time the entire area is on COVID-19 social distancing and stay home orders are in place.  Patient is of higher risk and therefore we are performing this by a virtual method.  Location of provider: home Location of patient: home Others present for call: no  This patient is having a recheck on some of her chronic medical conditions.  She has been under a lot of stress.  She reports that she is doing better with the situations at home.  She does have a daughter that is recently pregnant and so that some good news.  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  She is a middle school teacher.  She is having fairly persistent fever blisters she has never taken anything preventative.  We have discussed that she can use Valtrex 1 daily. In addition she is trying very hard to work on diet and exercise.  She would like to get her labs performed soon.  We will get order placed and she will come in at her earliest convenience.   ROS: Per HPI  No Known Allergies Past Medical History:  Diagnosis Date  . Acne   . Allergy   . Hyperlipidemia     Current Outpatient Medications:  .  cyclobenzaprine (FLEXERIL) 10 MG tablet, Take 1 tablet (10 mg total) by mouth 3 (three) times daily as needed. for muscle spams, Disp: 90 tablet, Rfl: 3 .  fluticasone (FLONASE) 50 MCG/ACT nasal spray, Place 2 sprays into both nostrils daily., Disp: 16 g, Rfl: 11 .  pantoprazole (PROTONIX) 40 MG tablet, Take 1 tablet (40 mg total) by mouth 2 (two) times daily., Disp: 90 tablet, Rfl: 3 .  Phentermine-Topiramate (QSYMIA) 7.5-46 MG CP24, Take 1 capsule by mouth daily., Disp: 30 capsule, Rfl: 2 .  rosuvastatin (CRESTOR) 40 MG tablet,  TAKE ONE (1) TABLET EACH DAY, Disp: 90 tablet, Rfl: 0 .  valACYclovir (VALTREX) 1000 MG tablet, Take 1 tablet (1,000 mg total) by mouth daily., Disp: 30 tablet, Rfl: 11  Assessment/ Plan: 53 y.o. female   1. Fever blister Checks 1000 mg 1 daily.  Prescription sent to the pharmacy  2. Body mass index 30.0-30.9, adult - Phentermine-Topiramate (QSYMIA) 7.5-46 MG CP24; Take 1 capsule by mouth daily.  Dispense: 30 capsule; Refill: 2  3. Well adult exam - CBC with Differential/Platelet; Future - CMP14+EGFR; Future - Lipid panel; Future - TSH; Future   Start time: 8:14 am End time: 8:30 am  Meds ordered this encounter  Medications  . valACYclovir (VALTREX) 1000 MG tablet    Sig: Take 1 tablet (1,000 mg total) by mouth daily.    Dispense:  30 tablet    Refill:  11    Order Specific Question:   Supervising Provider    Answer:   GOTTSCHALK, ASHLY M [[id number]]  . Phentermine-Topiramate (QSYMIA) 7.5-46 MG CP24    Sig: Take 1 capsule by mouth daily.    Dispense:  30 capsule    Refill:  2    Order Specific Question:   Supervising Provider    Answer:   GOTTSCHALK, ASHLY M [[id number]]    Angel Jones PA-C [facility] [phone]  

## 2019-01-27 ENCOUNTER — Other Ambulatory Visit: Payer: BC Managed Care – PPO

## 2019-01-27 ENCOUNTER — Other Ambulatory Visit: Payer: Self-pay

## 2019-01-27 DIAGNOSIS — Z0000 Encounter for general adult medical examination without abnormal findings: Secondary | ICD-10-CM

## 2019-01-28 LAB — CMP14+EGFR
ALT: 11 IU/L (ref 0–32)
AST: 17 IU/L (ref 0–40)
Albumin/Globulin Ratio: 2.7 — ABNORMAL HIGH (ref 1.2–2.2)
Albumin: 4.8 g/dL (ref 3.8–4.9)
Alkaline Phosphatase: 63 IU/L (ref 39–117)
BUN/Creatinine Ratio: 15 (ref 9–23)
BUN: 13 mg/dL (ref 6–24)
Bilirubin Total: 0.3 mg/dL (ref 0.0–1.2)
CO2: 23 mmol/L (ref 20–29)
Calcium: 9.3 mg/dL (ref 8.7–10.2)
Chloride: 103 mmol/L (ref 96–106)
Creatinine, Ser: 0.84 mg/dL (ref 0.57–1.00)
GFR calc Af Amer: 92 mL/min/{1.73_m2} (ref >59)
GFR calc non Af Amer: 80 mL/min/{1.73_m2} (ref >59)
Globulin, Total: 1.8 g/dL (ref 1.5–4.5)
Glucose: 94 mg/dL (ref 65–99)
Potassium: 4.1 mmol/L (ref 3.5–5.2)
Sodium: 140 mmol/L (ref 134–144)
Total Protein: 6.6 g/dL (ref 6.0–8.5)

## 2019-01-28 LAB — CBC WITH DIFFERENTIAL/PLATELET
Basophils Absolute: 0 10*3/uL (ref 0.0–0.2)
Basos: 1 %
EOS (ABSOLUTE): 0.1 10*3/uL (ref 0.0–0.4)
Eos: 2 %
Hematocrit: 40.3 % (ref 34.0–46.6)
Hemoglobin: 13 g/dL (ref 11.1–15.9)
Immature Grans (Abs): 0 10*3/uL (ref 0.0–0.1)
Immature Granulocytes: 0 %
Lymphocytes Absolute: 1.4 10*3/uL (ref 0.7–3.1)
Lymphs: 35 %
MCH: 26.4 pg — ABNORMAL LOW (ref 26.6–33.0)
MCHC: 32.3 g/dL (ref 31.5–35.7)
MCV: 82 fL (ref 79–97)
Monocytes Absolute: 0.3 10*3/uL (ref 0.1–0.9)
Monocytes: 8 %
Neutrophils Absolute: 2.2 10*3/uL (ref 1.4–7.0)
Neutrophils: 54 %
Platelets: 289 10*3/uL (ref 150–450)
RBC: 4.92 x10E6/uL (ref 3.77–5.28)
RDW: 13 % (ref 11.7–15.4)
WBC: 3.9 10*3/uL (ref 3.4–10.8)

## 2019-01-28 LAB — LIPID PANEL
Chol/HDL Ratio: 2.8 ratio (ref 0.0–4.4)
Cholesterol, Total: 207 mg/dL — ABNORMAL HIGH (ref 100–199)
HDL: 75 mg/dL (ref >39)
LDL Calculated: 104 mg/dL — ABNORMAL HIGH (ref 0–99)
Triglycerides: 142 mg/dL (ref 0–149)
VLDL Cholesterol Cal: 28 mg/dL (ref 5–40)

## 2019-01-28 LAB — TSH: TSH: 3 u[IU]/mL (ref 0.450–4.500)

## 2019-02-23 ENCOUNTER — Other Ambulatory Visit: Payer: Self-pay | Admitting: Physician Assistant

## 2019-02-23 DIAGNOSIS — M542 Cervicalgia: Secondary | ICD-10-CM

## 2019-02-23 DIAGNOSIS — M26609 Unspecified temporomandibular joint disorder, unspecified side: Secondary | ICD-10-CM

## 2019-04-08 ENCOUNTER — Telehealth: Payer: Self-pay | Admitting: Physician Assistant

## 2019-04-13 ENCOUNTER — Ambulatory Visit: Payer: Self-pay

## 2019-04-13 ENCOUNTER — Ambulatory Visit: Payer: BC Managed Care – PPO | Admitting: Family Medicine

## 2019-04-13 ENCOUNTER — Other Ambulatory Visit: Payer: Self-pay

## 2019-04-13 ENCOUNTER — Encounter: Payer: Self-pay | Admitting: Family Medicine

## 2019-04-13 VITALS — BP 116/78 | HR 66 | Ht 65.0 in | Wt 188.0 lb

## 2019-04-13 DIAGNOSIS — M25561 Pain in right knee: Secondary | ICD-10-CM | POA: Diagnosis not present

## 2019-04-13 DIAGNOSIS — M25562 Pain in left knee: Secondary | ICD-10-CM | POA: Diagnosis not present

## 2019-04-13 DIAGNOSIS — G8929 Other chronic pain: Secondary | ICD-10-CM | POA: Diagnosis not present

## 2019-04-13 DIAGNOSIS — M1710 Unilateral primary osteoarthritis, unspecified knee: Secondary | ICD-10-CM | POA: Insufficient documentation

## 2019-04-13 MED ORDER — DICLOFENAC SODIUM 2 % TD SOLN: 2.0000 g | Freq: Two times a day (BID) | TRANSDERMAL | 3 refills | Status: DC

## 2019-04-13 NOTE — Patient Instructions (Signed)
Good to see you.  Ice 20 minutes 2 times daily. Usually after activity and before bed. Exercises 3 times a week.  pennsaid pinkie amount topically 2 times daily as needed.  Turmeric 500mg  daily  Vitamin D 2000 IU daily  See me again in  4-6 weeks

## 2019-04-13 NOTE — Progress Notes (Signed)
Tawana ScaleZach Bernette Seeman D.O. [facility] [address] [place] [address] Phone: [phone] Subjective:    I'm seeing this patient by the request  of:    CC: Bilateral knee  ZHY:[id number]:Subjective  Robin Harrington is a 53 y.o. female coming in with complaint of bilateral knee pain. Plays pickleball for about 3 hours. Achy after activity. Knees pop. Ocassional swelling.   Onset-  Chronic  Location - Under the knee cap Duration-  Character- achy Aggravating factors- walking on pavement causes swelling Reliving factors-  Therapies tried- ice  Severity-5/10     Past Medical History:  Diagnosis Date  . Acne   . Allergy   . Hyperlipidemia    No past surgical history on file. Social History   Socioeconomic History  . Marital status: Married    Spouse name: Not on file  . Number of children: Not on file  . Years of education: Not on file  . Highest education level: Not on file  Occupational History  . Not on file  Social Needs  . Financial resource strain: Not on file  . Food insecurity    Worry: Not on file    Inability: Not on file  . Transportation needs    Medical: Not on file    Non-medical: Not on file  Tobacco Use  . Smoking status: Never Smoker  . Smokeless tobacco: Never Used  Substance and Sexual Activity  . Alcohol use: No  . Drug use: No  . Sexual activity: Not on file  Lifestyle  . Physical activity    Days per week: Not on file    Minutes per session: Not on file  . Stress: Not on file  Relationships  . Social Musicianconnections    Talks on phone: Not on file    Gets together: Not on file    Attends religious service: Not on file    Active member of club or organization: Not on file    Attends meetings of clubs or organizations: Not on file    Relationship status: Not on file  Other Topics Concern  . Not on file  Social History Narrative  . Not on file   No Known Allergies Family History  Problem Relation Age of Onset  . Hypertension  Mother   . Atrial fibrillation Mother   . Cancer Mother        melanoma   . Cancer Father        colon   . Heart disease Father      Current Outpatient Medications (Cardiovascular):  .  rosuvastatin (CRESTOR) 40 MG tablet, TAKE ONE (1) TABLET EACH DAY  Current Outpatient Medications (Respiratory):  .  fluticasone (FLONASE) 50 MCG/ACT nasal spray, Place 2 sprays into both nostrils daily.    Current Outpatient Medications (Other):  .  cyclobenzaprine (FLEXERIL) 10 MG tablet, TAKE ONE TABLET 3 TIMES A DAY AS NEEDED. Marland Kitchen.  pantoprazole (PROTONIX) 40 MG tablet, Take 1 tablet (40 mg total) by mouth 2 (two) times daily. .  Phentermine-Topiramate (QSYMIA) 7.5-46 MG CP24, Take 1 capsule by mouth daily. .  valACYclovir (VALTREX) 1000 MG tablet, Take 1 tablet (1,000 mg total) by mouth daily. .  Diclofenac Sodium 2 % SOLN, Place 2 g onto the skin 2 (two) times daily.    Past medical history, social, surgical and family history all reviewed in electronic medical record.  No pertanent information unless stated regarding to the chief complaint.   Review of Systems:  No headache,  visual changes, nausea, vomiting, diarrhea, constipation, dizziness, abdominal pain, skin rash, fevers, chills, night sweats, weight loss, swollen lymph nodes, body aches, joint swelling, muscle aches, chest pain, shortness of breath, mood changes.  Positive muscle aches  Objective  Blood pressure 116/78, pulse 66, height 5\' 5"  (1.651 m), weight 188 lb (85.3 kg), SpO2 99 %.    General: No apparent distress alert and oriented x3 mood and affect normal, dressed appropriately.  HEENT: Pupils equal, extraocular movements intact  Respiratory: Patient's speak in full sentences and does not appear short of breath  Cardiovascular: No lower extremity edema, non tender, no erythema  Skin: Warm dry intact with no signs of infection or rash on extremities or on axial skeleton.  Abdomen: Soft nontender  Neuro: Cranial nerves II  through XII are intact, neurovascularly intact in all extremities with 2+ DTRs and 2+ pulses.  Lymph: No lymphadenopathy of posterior or anterior cervical chain or axillae bilaterally.  Gait normal with good balance and coordination.  MSK:  Non tender with full range of motion and good stability and symmetric strength and tone of shoulders, elbows, wrist, hip, and ankles bilaterally.  Bilateral knee exam shows the patient does have mild lateral translocation of the kneecaps.  Positive patellar grind noted.  Patient has minimal pain over the medial joint line right greater than left.  No significant instability.  Full range of motion.  Limited musculoskeletal ultrasound was performed and interpreted by Lyndal Pulley  Limited ultrasound of patient's patella shows some very mild narrowing of the patellofemoral joint.  Very trace effusion right greater than left.  Medial and lateral joint space very minimal arthritic changes but no injury to the MCL or the meniscus medially or laterally. Impression: Mild patellofemoral arthritis  97110; 15 additional minutes spent for Therapeutic exercises as stated in above notes.  This included exercises focusing on stretching, strengthening, with significant focus on eccentric aspects.   Long term goals include an improvement in range of motion, strength, endurance as well as avoiding reinjury. Patient's frequency would include in 1-2 times a day, 3-5 times a week for a duration of 6-12 weeks. Reviewed anatomy using anatomical model and how PFS occurs.  Given rehab exercises handout for VMO, hip abductors, core, entire kinetic chain including proprioception exercises.  Could benefit from PT, regular exercise, upright biking, and a PFS knee brace to assist with tracking abnormalities.   Proper technique shown and discussed handout in great detail with ATC.  All questions were discussed and answered.     Impression and Recommendations:     This [place] required  medical decision making of moderate complexity. The above documentation has been reviewed and is accurate and complete Lyndal Pulley, DO       Note: This dictation was prepared with Dragon dictation along with smaller phrase technology. Any transcriptional errors that result from this process are unintentional.

## 2019-04-13 NOTE — Assessment & Plan Note (Signed)
Both knees.  Very mild.,  Topical anti-inflammatories prescribed.  Discussed strengthening of the VMO and hip abductors.  With proper shoes.  Stressed icing regimen, patient will follow-up again in 4 to 6 weeks.  Future considerations will include injections, bracing, formal physical therapy.

## 2019-05-18 ENCOUNTER — Ambulatory Visit: Payer: BC Managed Care – PPO | Admitting: Family Medicine

## 2019-06-07 ENCOUNTER — Encounter: Payer: Self-pay | Admitting: Physician Assistant

## 2019-06-07 ENCOUNTER — Ambulatory Visit (INDEPENDENT_AMBULATORY_CARE_PROVIDER_SITE_OTHER): Payer: BC Managed Care – PPO | Admitting: Physician Assistant

## 2019-06-07 DIAGNOSIS — K219 Gastro-esophageal reflux disease without esophagitis: Secondary | ICD-10-CM

## 2019-06-07 DIAGNOSIS — L700 Acne vulgaris: Secondary | ICD-10-CM

## 2019-06-07 DIAGNOSIS — Z6830 Body mass index (BMI) 30.0-30.9, adult: Secondary | ICD-10-CM | POA: Diagnosis not present

## 2019-06-07 DIAGNOSIS — E785 Hyperlipidemia, unspecified: Secondary | ICD-10-CM | POA: Diagnosis not present

## 2019-06-07 MED ORDER — ROSUVASTATIN CALCIUM 40 MG PO TABS: 40.0000 mg | ORAL_TABLET | Freq: Every day | ORAL | 3 refills | Status: DC

## 2019-06-07 MED ORDER — QSYMIA 7.5-46 MG PO CP24: 1.0000 | ORAL_CAPSULE | Freq: Every day | ORAL | 2 refills | Status: DC

## 2019-06-07 MED ORDER — DOXYCYCLINE HYCLATE 100 MG PO TABS: 100.0000 mg | ORAL_TABLET | Freq: Two times a day (BID) | ORAL | 11 refills | Status: DC

## 2019-06-07 MED ORDER — PANTOPRAZOLE SODIUM 40 MG PO TBEC: 40.0000 mg | DELAYED_RELEASE_TABLET | Freq: Every day | ORAL | 3 refills | Status: DC

## 2019-06-07 NOTE — Progress Notes (Signed)
Telephone visit  Subjective: CW:CBJS, GERD, weight gain, med refills PCP: Terald Sleeper, PA-C EGB:TDVVO Robin Harrington is a 53 y.o. female calls for telephone consult today. Patient provides verbal consent for consult held via phone.  Patient is identified with 2 separate identifiers.  At this time the entire area is on COVID-19 social distancing and stay home orders are in place.  Patient is of higher risk and therefore we are performing this by a virtual method.  Location of patient: home Location of provider: HOME Others present for call: no  This patient is having a recheck on her chronic conditions and needs refills on her medications.  The patient does have acne that has been flared up significantly because of the wearing a mask.  She has taken doxycycline in the past.  She does have GERD and well controlled.  She is still working on her weight loss efforts and using Qsymia. We have looked at all of her medications and will have her refill sent to the pharmacy.   ROS: Per HPI  No Known Allergies Past Medical History:  Diagnosis Date  . Acne   . Allergy   . Hyperlipidemia     Current Outpatient Medications:  .  cyclobenzaprine (FLEXERIL) 10 MG tablet, TAKE ONE TABLET 3 TIMES A DAY AS NEEDED., Disp: 90 tablet, Rfl: 3 .  Diclofenac Sodium 2 % SOLN, Place 2 g onto the skin 2 (two) times daily., Disp: 112 g, Rfl: 3 .  doxycycline (VIBRA-TABS) 100 MG tablet, Take 1 tablet (100 mg total) by mouth 2 (two) times daily., Disp: 40 tablet, Rfl: 11 .  fluticasone (FLONASE) 50 MCG/ACT nasal spray, Place 2 sprays into both nostrils daily., Disp: 16 g, Rfl: 11 .  pantoprazole (PROTONIX) 40 MG tablet, Take 1 tablet (40 mg total) by mouth daily., Disp: 90 tablet, Rfl: 3 .  Phentermine-Topiramate (QSYMIA) 7.5-46 MG CP24, Take 1 capsule by mouth daily., Disp: 30 capsule, Rfl: 2 .  rosuvastatin (CRESTOR) 40 MG tablet, Take 1 tablet (40 mg total) by mouth daily., Disp: 90 tablet, Rfl: 3 .   valACYclovir (VALTREX) 1000 MG tablet, Take 1 tablet (1,000 mg total) by mouth daily., Disp: 30 tablet, Rfl: 11  Assessment/ Plan: 53 y.o. female   1. Body mass index 30.0-30.9, adult - Phentermine-Topiramate (QSYMIA) 7.5-46 MG CP24; Take 1 capsule by mouth daily.  Dispense: 30 capsule; Refill: 2  2. Acne vulgaris - doxycycline (VIBRA-TABS) 100 MG tablet; Take 1 tablet (100 mg total) by mouth 2 (two) times daily.  Dispense: 40 tablet; Refill: 11  3. Hyperlipidemia, unspecified hyperlipidemia type - rosuvastatin (CRESTOR) 40 MG tablet; Take 1 tablet (40 mg total) by mouth daily.  Dispense: 90 tablet; Refill: 3  4. Gastroesophageal reflux disease without esophagitis - pantoprazole (PROTONIX) 40 MG tablet; Take 1 tablet (40 mg total) by mouth daily.  Dispense: 90 tablet; Refill: 3   No follow-ups on file.  Continue all other maintenance medications as listed above.  Start time: 7:55 AM End time: 8:08 AM  Meds ordered this encounter  Medications  . doxycycline (VIBRA-TABS) 100 MG tablet    Sig: Take 1 tablet (100 mg total) by mouth 2 (two) times daily.    Dispense:  40 tablet    Refill:  11    Order Specific Question:   Supervising Provider    Answer:   Brooke Bonito  . DISCONTD: Phentermine-Topiramate (QSYMIA) 7.5-46 MG CP24    Sig: Take 1 capsule by mouth daily.  Dispense:  30 capsule    Refill:  2    Order Specific Question:   Supervising Provider    Answer:   Melford AaseJONES, Neyah Ellerman S [[id number]]  . rosuvastatin (CRESTOR) 40 MG tablet    Sig: Take 1 tablet (40 mg total) by mouth daily.    Dispense:  90 tablet    Refill:  3    Order Specific Question:   Supervising Provider    Answer:   Melford AaseJONES, Akeya Ryther S [[id number]]  . pantoprazole (PROTONIX) 40 MG tablet    Sig: Take 1 tablet (40 mg total) by mouth daily.    Dispense:  90 tablet    Refill:  3    Order Specific Question:   Supervising Provider    Answer:   Melford AaseJONES, Agam Davenport S [[id number]]  . Phentermine-Topiramate (QSYMIA) 7.5-46 MG  CP24    Sig: Take 1 capsule by mouth daily.    Dispense:  30 capsule    Refill:  2    Order Specific Question:   Supervising Provider    Answer:   Remus LofflerJONES, Josey Dettmann S [[id number]]    Prudy FeelerAngel Jimmye Wisnieski PA-C [facility] [phone]

## 2019-11-04 ENCOUNTER — Encounter: Payer: Self-pay | Admitting: Physician Assistant

## 2019-11-04 ENCOUNTER — Ambulatory Visit (INDEPENDENT_AMBULATORY_CARE_PROVIDER_SITE_OTHER): Payer: BC Managed Care – PPO | Admitting: Physician Assistant

## 2019-11-04 DIAGNOSIS — J0110 Acute frontal sinusitis, unspecified: Secondary | ICD-10-CM

## 2019-11-04 NOTE — Progress Notes (Signed)
This patient had a Covid test and got it back this morning and it was positive so she knows this is the reason she was feeling poorly.  No visit is needed today. Remus Loffler PA-C [facility] [address]  [place], [place] [address] [phone]

## 2019-11-09 ENCOUNTER — Telehealth: Payer: Self-pay | Admitting: Physician Assistant

## 2019-11-09 NOTE — Telephone Encounter (Signed)
Advised pt of provider feedback and she voiced understanding.

## 2019-11-09 NOTE — Telephone Encounter (Signed)
What is the recommended wait time after dx'd with COVID till getting vaccine? 30 days?

## 2019-11-09 NOTE — Telephone Encounter (Signed)
Correct, needs to wait until healed 30 days

## 2019-11-18 ENCOUNTER — Other Ambulatory Visit: Payer: Self-pay | Admitting: Physician Assistant

## 2019-11-18 DIAGNOSIS — M542 Cervicalgia: Secondary | ICD-10-CM

## 2019-11-18 DIAGNOSIS — M26609 Unspecified temporomandibular joint disorder, unspecified side: Secondary | ICD-10-CM

## 2019-12-14 ENCOUNTER — Telehealth: Payer: Self-pay | Admitting: Physician Assistant

## 2019-12-14 ENCOUNTER — Other Ambulatory Visit: Payer: Self-pay | Admitting: Physician Assistant

## 2019-12-14 DIAGNOSIS — Z6830 Body mass index (BMI) 30.0-30.9, adult: Secondary | ICD-10-CM

## 2019-12-14 NOTE — Telephone Encounter (Signed)
Pt called - appt made  ?

## 2019-12-14 NOTE — Telephone Encounter (Signed)
Last office visit 11/04/19  Last refill 06/07/19, #30, 2 refills

## 2019-12-16 ENCOUNTER — Other Ambulatory Visit: Payer: Self-pay

## 2019-12-16 ENCOUNTER — Other Ambulatory Visit: Payer: BC Managed Care – PPO

## 2019-12-16 ENCOUNTER — Other Ambulatory Visit: Payer: Self-pay | Admitting: Physician Assistant

## 2019-12-16 DIAGNOSIS — Z0000 Encounter for general adult medical examination without abnormal findings: Secondary | ICD-10-CM

## 2019-12-16 DIAGNOSIS — E785 Hyperlipidemia, unspecified: Secondary | ICD-10-CM

## 2019-12-17 LAB — CBC WITH DIFFERENTIAL/PLATELET
Basophils Absolute: 0 10*3/uL (ref 0.0–0.2)
Basos: 1 %
EOS (ABSOLUTE): 0.1 10*3/uL (ref 0.0–0.4)
Eos: 2 %
Hematocrit: 37.9 % (ref 34.0–46.6)
Hemoglobin: 12.6 g/dL (ref 11.1–15.9)
Immature Grans (Abs): 0 10*3/uL (ref 0.0–0.1)
Immature Granulocytes: 0 %
Lymphocytes Absolute: 1.5 10*3/uL (ref 0.7–3.1)
Lymphs: 40 %
MCH: 27.8 pg (ref 26.6–33.0)
MCHC: 33.2 g/dL (ref 31.5–35.7)
MCV: 84 fL (ref 79–97)
Monocytes Absolute: 0.4 10*3/uL (ref 0.1–0.9)
Monocytes: 11 %
Neutrophils Absolute: 1.7 10*3/uL (ref 1.4–7.0)
Neutrophils: 46 %
Platelets: 286 10*3/uL (ref 150–450)
RBC: 4.54 x10E6/uL (ref 3.77–5.28)
RDW: 13.7 % (ref 11.7–15.4)
WBC: 3.8 10*3/uL (ref 3.4–10.8)

## 2019-12-17 LAB — LIPID PANEL
Chol/HDL Ratio: 2.3 ratio (ref 0.0–4.4)
Cholesterol, Total: 189 mg/dL (ref 100–199)
HDL: 81 mg/dL (ref >39)
LDL Chol Calc (NIH): 92 mg/dL (ref 0–99)
Triglycerides: 88 mg/dL (ref 0–149)
VLDL Cholesterol Cal: 16 mg/dL (ref 5–40)

## 2019-12-17 LAB — VITAMIN D 25 HYDROXY (VIT D DEFICIENCY, FRACTURES): Vit D, 25-Hydroxy: 28.9 ng/mL — ABNORMAL LOW (ref 30.0–100.0)

## 2019-12-17 LAB — CMP14+EGFR
ALT: 11 IU/L (ref 0–32)
AST: 23 IU/L (ref 0–40)
Albumin/Globulin Ratio: 2.1 (ref 1.2–2.2)
Albumin: 4.2 g/dL (ref 3.8–4.9)
Alkaline Phosphatase: 71 IU/L (ref 39–117)
BUN/Creatinine Ratio: 21 (ref 9–23)
BUN: 16 mg/dL (ref 6–24)
Bilirubin Total: 0.2 mg/dL (ref 0.0–1.2)
CO2: 24 mmol/L (ref 20–29)
Calcium: 9.1 mg/dL (ref 8.7–10.2)
Chloride: 107 mmol/L — ABNORMAL HIGH (ref 96–106)
Creatinine, Ser: 0.78 mg/dL (ref 0.57–1.00)
GFR calc Af Amer: 100 mL/min/{1.73_m2} (ref >59)
GFR calc non Af Amer: 87 mL/min/{1.73_m2} (ref >59)
Globulin, Total: 2 g/dL (ref 1.5–4.5)
Glucose: 92 mg/dL (ref 65–99)
Potassium: 4.6 mmol/L (ref 3.5–5.2)
Sodium: 143 mmol/L (ref 134–144)
Total Protein: 6.2 g/dL (ref 6.0–8.5)

## 2019-12-17 LAB — TSH: TSH: 3.37 u[IU]/mL (ref 0.450–4.500)

## 2019-12-30 ENCOUNTER — Ambulatory Visit (INDEPENDENT_AMBULATORY_CARE_PROVIDER_SITE_OTHER): Payer: BC Managed Care – PPO | Admitting: Physician Assistant

## 2019-12-30 DIAGNOSIS — E785 Hyperlipidemia, unspecified: Secondary | ICD-10-CM

## 2019-12-30 DIAGNOSIS — Z6830 Body mass index (BMI) 30.0-30.9, adult: Secondary | ICD-10-CM | POA: Diagnosis not present

## 2019-12-30 DIAGNOSIS — E782 Mixed hyperlipidemia: Secondary | ICD-10-CM

## 2019-12-30 MED ORDER — OMEPRAZOLE 20 MG PO CPDR: 20.0000 mg | DELAYED_RELEASE_CAPSULE | Freq: Every day | ORAL | 3 refills | Status: DC

## 2019-12-30 MED ORDER — PRAVASTATIN SODIUM 80 MG PO TABS: 80.0000 mg | ORAL_TABLET | Freq: Every day | ORAL | 1 refills | Status: DC

## 2019-12-30 MED ORDER — QSYMIA 7.5-46 MG PO CP24: 1.0000 | ORAL_CAPSULE | Freq: Every day | ORAL | 2 refills | Status: DC

## 2019-12-30 MED ORDER — VALACYCLOVIR HCL 1 G PO TABS: 1000.0000 mg | ORAL_TABLET | Freq: Every day | ORAL | 11 refills | Status: DC

## 2019-12-30 NOTE — Progress Notes (Signed)
Telephone visit  Subjective: FO:YDXAJOI chronic condiitons PCP: Terald Sleeper, PA-C NOM:VEHMC [name] is a 54 y.o. female calls for telephone consult today. Patient provides verbal consent for consult held via phone.  Patient is identified with 2 separate identifiers.  At this time the entire area is on COVID-19 social distancing and stay home orders are in place.  Patient is of higher risk and therefore we are performing this by a virtual method.  Location of patient: home Location of provider: [facility] Others present for call: no   Patient of is a recheck for her chronic medical conditions that do include obesity, lipidemia, GERD.  She does need refills on some of her medication.  She is trying to lower her statin medication as much as possible and has done very well with recent lab work.  Her cholesterol has done a lot better.  The statin and pravastatin is what she is currently taking because she had trouble with Crestor.  Pantoprazole is not really needed for heartburn she thinks she get away with omeprazole at this point.  So refills will be sent on all of these.  And I will plan to recheck her in 3 to 6 months.  ROS: Per HPI  No Known Allergies Past Medical History:  Diagnosis Date  . Acne   . Allergy   . Hyperlipidemia     Current Outpatient Medications:  .  cyclobenzaprine (FLEXERIL) 10 MG tablet, TAKE ONE TABLET 3 TIMES A DAY AS NEEDED., Disp: 90 tablet, Rfl: 3 .  Diclofenac Sodium 2 % SOLN, Place 2 g onto the skin 2 (two) times daily., Disp: 112 g, Rfl: 3 .  doxycycline (VIBRA-TABS) 100 MG tablet, Take 1 tablet (100 mg total) by mouth 2 (two) times daily., Disp: 40 tablet, Rfl: 11 .  fluticasone (FLONASE) 50 MCG/ACT nasal spray, Place 2 sprays into both nostrils daily., Disp: 16 g, Rfl: 11 .  omeprazole (PRILOSEC) 20 MG capsule, Take 1 capsule (20 mg total) by mouth daily., Disp: 30 capsule, Rfl: 3 .  Phentermine-Topiramate (QSYMIA) 7.5-46 MG CP24, Take 1 capsule by  mouth daily., Disp: 30 capsule, Rfl: 2 .  pravastatin (PRAVACHOL) 80 MG tablet, Take 1 tablet (80 mg total) by mouth daily., Disp: 90 tablet, Rfl: 1 .  valACYclovir (VALTREX) 1000 MG tablet, Take 1 tablet (1,000 mg total) by mouth daily., Disp: 30 tablet, Rfl: 11  Assessment/ Plan: 54 y.o. female   1. Body mass index 30.0-30.9, adult - Phentermine-Topiramate (QSYMIA) 7.5-46 MG CP24; Take 1 capsule by mouth daily.  Dispense: 30 capsule; Refill: 2  2. Hyperlipidemia, unspecified hyperlipidemia type - Lipid panel; Future  3. Mixed hyperlipidemia - pravastatin (PRAVACHOL) 80 MG tablet; Take 1 tablet (80 mg total) by mouth daily.  Dispense: 90 tablet; Refill: 1   No follow-ups on file.  Continue all other maintenance medications as listed above.  I discussed the assessment and treatment plan with the patient. The patient was provided an opportunity to ask questions and all were answered. The patient agreed with the plan and demonstrated an understanding of the instructions.   The patient was advised to call back or seek an in-person evaluation if the symptoms worsen or if the condition fails to improve as anticipated.   The above assessment and management plan was discussed with the patient. The patient verbalized understanding of and has agreed to the management plan. Patient is aware to call the clinic if symptoms persist or worsen. Patient is aware when to  return to the clinic for a follow-up visit. Patient educated on when it is appropriate to go to the emergency department.    Start time: 1:49 PM End time: 1:59 PM  Meds ordered this encounter  Medications  . pravastatin (PRAVACHOL) 80 MG tablet    Sig: Take 1 tablet (80 mg total) by mouth daily.    Dispense:  90 tablet    Refill:  1    Order Specific Question:   Supervising Provider    Answer:   Raliegh Ip [[id number]]  . valACYclovir (VALTREX) 1000 MG tablet    Sig: Take 1 tablet (1,000 mg total) by mouth daily.     Dispense:  30 tablet    Refill:  11    Order Specific Question:   Supervising Provider    Answer:   Raliegh Ip [[id number]]  . omeprazole (PRILOSEC) 20 MG capsule    Sig: Take 1 capsule (20 mg total) by mouth daily.    Dispense:  30 capsule    Refill:  3    Order Specific Question:   Supervising Provider    Answer:   Raliegh Ip [[id number]]  . Phentermine-Topiramate (QSYMIA) 7.5-46 MG CP24    Sig: Take 1 capsule by mouth daily.    Dispense:  30 capsule    Refill:  2    Order Specific Question:   Supervising Provider    Answer:   Raliegh Ip [[id number]]    Prudy Feeler PA-C [facility] [phone]

## 2020-01-03 ENCOUNTER — Encounter: Payer: Self-pay | Admitting: Physician Assistant

## 2020-03-14 ENCOUNTER — Telehealth: Payer: Self-pay | Admitting: Family Medicine

## 2020-03-14 NOTE — Telephone Encounter (Signed)
[facility] pharmacy called [phone]). They require a PA for [place] and all info is in Cover My Meds awaiting our response.

## 2020-03-16 NOTE — Telephone Encounter (Signed)
Waiting for the clinical questions will check back later in covermymeds

## 2020-03-22 NOTE — Telephone Encounter (Signed)
[facility] called checking the status on this PA.  Phone: [phone] Or can respond through CoverMyMeds  

## 2020-03-29 ENCOUNTER — Other Ambulatory Visit: Payer: Self-pay | Admitting: *Deleted

## 2020-03-29 DIAGNOSIS — E782 Mixed hyperlipidemia: Secondary | ICD-10-CM

## 2020-03-29 MED ORDER — PRAVASTATIN SODIUM 80 MG PO TABS: 80.0000 mg | ORAL_TABLET | Freq: Every day | ORAL | 0 refills | Status: DC

## 2020-03-30 NOTE — Telephone Encounter (Signed)
Done through cover my meds

## 2020-06-29 ENCOUNTER — Other Ambulatory Visit: Payer: Self-pay | Admitting: Nurse Practitioner

## 2020-06-29 DIAGNOSIS — E782 Mixed hyperlipidemia: Secondary | ICD-10-CM

## 2020-07-02 ENCOUNTER — Other Ambulatory Visit: Payer: Self-pay | Admitting: *Deleted

## 2020-07-02 DIAGNOSIS — L700 Acne vulgaris: Secondary | ICD-10-CM

## 2020-07-12 ENCOUNTER — Encounter: Payer: Self-pay | Admitting: Family Medicine

## 2020-07-12 ENCOUNTER — Other Ambulatory Visit: Payer: Self-pay

## 2020-07-12 ENCOUNTER — Ambulatory Visit: Payer: BC Managed Care – PPO | Admitting: Family Medicine

## 2020-07-12 VITALS — BP 113/75 | HR 78 | Temp 98.2°F | Ht 65.0 in | Wt 190.0 lb

## 2020-07-12 DIAGNOSIS — B001 Herpesviral vesicular dermatitis: Secondary | ICD-10-CM | POA: Diagnosis not present

## 2020-07-12 DIAGNOSIS — E782 Mixed hyperlipidemia: Secondary | ICD-10-CM

## 2020-07-12 DIAGNOSIS — J302 Other seasonal allergic rhinitis: Secondary | ICD-10-CM | POA: Diagnosis not present

## 2020-07-12 DIAGNOSIS — K219 Gastro-esophageal reflux disease without esophagitis: Secondary | ICD-10-CM | POA: Diagnosis not present

## 2020-07-12 DIAGNOSIS — Z23 Encounter for immunization: Secondary | ICD-10-CM

## 2020-07-12 DIAGNOSIS — Z6831 Body mass index (BMI) 31.0-31.9, adult: Secondary | ICD-10-CM

## 2020-07-12 DIAGNOSIS — M26609 Unspecified temporomandibular joint disorder, unspecified side: Secondary | ICD-10-CM

## 2020-07-12 DIAGNOSIS — L700 Acne vulgaris: Secondary | ICD-10-CM

## 2020-07-12 MED ORDER — CYCLOBENZAPRINE HCL 10 MG PO TABS: 10.0000 mg | ORAL_TABLET | Freq: Every day | ORAL | 3 refills | Status: DC

## 2020-07-12 MED ORDER — COENZYME Q10 30 MG PO CAPS: 30.0000 mg | ORAL_CAPSULE | Freq: Every day | ORAL | 3 refills | Status: DC

## 2020-07-12 MED ORDER — VALACYCLOVIR HCL 1 G PO TABS: 1000.0000 mg | ORAL_TABLET | Freq: Every day | ORAL | 11 refills | Status: DC

## 2020-07-12 MED ORDER — DOXYCYCLINE HYCLATE 100 MG PO TABS: 100.0000 mg | ORAL_TABLET | Freq: Two times a day (BID) | ORAL | 11 refills | Status: DC

## 2020-07-12 MED ORDER — FLUTICASONE PROPIONATE 50 MCG/ACT NA SUSP: 2.0000 | Freq: Every day | NASAL | 11 refills | Status: DC

## 2020-07-12 MED ORDER — PRAVASTATIN SODIUM 80 MG PO TABS: 80.0000 mg | ORAL_TABLET | Freq: Every day | ORAL | 0 refills | Status: DC

## 2020-07-12 MED ORDER — OMEPRAZOLE 20 MG PO CPDR: 20.0000 mg | DELAYED_RELEASE_CAPSULE | Freq: Every day | ORAL | 3 refills | Status: DC

## 2020-07-12 NOTE — Progress Notes (Signed)
Subjective: CC: chronic follow up PCP: Gwenlyn Perking, FNP  [id number] L Andres Harrington is a 54 y.o. female presenting to clinic today for:  1. Hyperlipidemia  Robin is taking pravastatin with CoQ10. Reports no issues with compliance or side effects. She is doing her best to eat a heart healthy diet. She exercises daily by playing pickleball. She is not fasting today and would like to wait until February for blood work again as her insurance will only cover it yearly.   2. Acne She has been out of doxycycline for the last week or two and had increase in acne since. She has had an increase in acne since having to wear a mask daily. Reports good relief with doxycycline.   3. GERD Robin Harrington reports heartburn and belching. Prilosec controls her symptoms well. She has also noticed an improvement with maintaining a lower weight. She denies trouble swallowing, bleeding, or vomiting.   4. Seasonal allergies She take flonase for nasal congestion and sneezing related to allergies. Reports good relief.   5. TMJ Robin Harrington has a history of TMJ and takes flexeril at night to help prevent headaches in the morning. She has been able to notice an improvement.   6. Fever blisters She takes valtrex for prevention of fever blisters. She reports this controls her symptoms well.   Relevant past medical, surgical, family, and social history reviewed and updated as indicated.  Allergies and medications reviewed and updated.  No Known Allergies Past Medical History:  Diagnosis Date  . Acne   . Allergy   . Hyperlipidemia     Current Outpatient Medications:  .  cyclobenzaprine (FLEXERIL) 10 MG tablet, TAKE ONE TABLET 3 TIMES A DAY AS NEEDED., Disp: 90 tablet, Rfl: 3 .  Diclofenac Sodium 2 % SOLN, Place 2 g onto the skin 2 (two) times daily., Disp: 112 g, Rfl: 3 .  fluticasone (FLONASE) 50 MCG/ACT nasal spray, Place 2 sprays into both nostrils daily., Disp: 16 g, Rfl: 11 .  omeprazole (PRILOSEC) 20 MG capsule,  Take 1 capsule (20 mg total) by mouth daily., Disp: 30 capsule, Rfl: 3 .  pravastatin (PRAVACHOL) 80 MG tablet, Take 1 tablet (80 mg total) by mouth daily. (Needs to be seen before next refill), Disp: 30 tablet, Rfl: 0 .  valACYclovir (VALTREX) 1000 MG tablet, Take 1 tablet (1,000 mg total) by mouth daily., Disp: 30 tablet, Rfl: 11 Social History   Socioeconomic History  . Marital status: Married    Spouse name: Not on file  . Number of children: Not on file  . Years of education: Not on file  . Highest education level: Not on file  Occupational History  . Not on file  Tobacco Use  . Smoking status: Never Smoker  . Smokeless tobacco: Never Used  Vaping Use  . Vaping Use: Never used  Substance and Sexual Activity  . Alcohol use: No  . Drug use: No  . Sexual activity: Not on file  Other Topics Concern  . Not on file  Social History Narrative  . Not on file   Social Determinants of Health   Financial Resource Strain:   . Difficulty of Paying Living Expenses: Not on file  Food Insecurity:   . Worried About Charity fundraiser in the Last Year: Not on file  . Ran Out of Food in the Last Year: Not on file  Transportation Needs:   . Lack of Transportation (Medical): Not on file  . Lack of Transportation (Non-Medical): Not  on file  Physical Activity:   . Days of Exercise per Week: Not on file  . Minutes of Exercise per Session: Not on file  Stress:   . Feeling of Stress : Not on file  Social Connections:   . Frequency of Communication with Friends and Family: Not on file  . Frequency of Social Gatherings with Friends and Family: Not on file  . Attends Religious Services: Not on file  . Active Member of Clubs or Organizations: Not on file  . Attends Archivist Meetings: Not on file  . Marital Status: Not on file  Intimate Partner Violence:   . Fear of Current or Ex-Partner: Not on file  . Emotionally Abused: Not on file  . Physically Abused: Not on file  .  Sexually Abused: Not on file   Family History  Problem Relation Age of Onset  . Hypertension Mother   . Atrial fibrillation Mother   . Cancer Mother        melanoma   . Cancer Father        colon   . Heart disease Father     Review of Systems  Constitutional: Negative for chills, fatigue, fever and unexpected weight change.  HENT: Negative for dental problem, ear pain and trouble swallowing.   Respiratory: Negative for shortness of breath.   Cardiovascular: Negative for chest pain and leg swelling.  Gastrointestinal: Negative for blood in stool, nausea and vomiting.  Endocrine: Negative for cold intolerance, heat intolerance, polydipsia, polyphagia and polyuria.  Genitourinary: Negative for dysuria.  Musculoskeletal: Negative for myalgias.  Neurological: Negative for dizziness and speech difficulty.  Psychiatric/Behavioral: Negative for suicidal ideas.     Objective: Office vital signs reviewed. BP 113/75   Pulse 78   Temp 98.2 F (36.8 C) (Temporal)   Ht $R'5\' 5"'ph$  (1.651 m)   Wt 190 lb (86.2 kg)   BMI 31.62 kg/m   Physical Examination:  Physical Exam Vitals and nursing note reviewed.  Constitutional:      General: She is not in acute distress.    Appearance: Normal appearance. She is not ill-appearing or diaphoretic.  HENT:     Head: Normocephalic and atraumatic.     Right Ear: Tympanic membrane, ear canal and external ear normal.     Left Ear: Tympanic membrane, ear canal and external ear normal.  Neck:     Vascular: No carotid bruit.  Cardiovascular:     Rate and Rhythm: Normal rate and regular rhythm.     Heart sounds: Normal heart sounds. No murmur heard.   Pulmonary:     Effort: Pulmonary effort is normal.     Breath sounds: Normal breath sounds.  Abdominal:     General: Bowel sounds are normal.     Palpations: Abdomen is soft.  Musculoskeletal:     Cervical back: Neck supple. No tenderness.  Lymphadenopathy:     Cervical: No cervical adenopathy.    Skin:    General: Skin is warm and dry.     Capillary Refill: Capillary refill takes less than 2 seconds.  Neurological:     General: No focal deficit present.     Mental Status: She is alert and oriented to person, place, and time.     Motor: No weakness.  Psychiatric:        Mood and Affect: Mood normal.        Behavior: Behavior normal.      Results for orders placed or performed in visit on 12/16/19  VITAMIN D 25 Hydroxy (Vit-D Deficiency, Fractures)  Result Value Ref Range   Vit D, 25-Hydroxy 28.9 (L) 30.0 - 100.0 ng/mL  TSH  Result Value Ref Range   TSH 3.370 0.450 - 4.500 uIU/mL  Lipid panel  Result Value Ref Range   Cholesterol, Total 189 100 - 199 mg/dL   Triglycerides 88 0 - 149 mg/dL   HDL 81 >39 mg/dL   VLDL Cholesterol Cal 16 5 - 40 mg/dL   LDL Chol Calc (NIH) 92 0 - 99 mg/dL   Chol/HDL Ratio 2.3 0.0 - 4.4 ratio  CMP14+EGFR  Result Value Ref Range   Glucose 92 65 - 99 mg/dL   BUN 16 6 - 24 mg/dL   Creatinine, Ser 0.78 0.57 - 1.00 mg/dL   GFR calc non Af Amer 87 >59 mL/min/1.73   GFR calc Af Amer 100 >59 mL/min/1.73   BUN/Creatinine Ratio 21 9 - 23   Sodium 143 134 - 144 mmol/L   Potassium 4.6 3.5 - 5.2 mmol/L   Chloride 107 (H) 96 - 106 mmol/L   CO2 24 20 - 29 mmol/L   Calcium 9.1 8.7 - 10.2 mg/dL   Total Protein 6.2 6.0 - 8.5 g/dL   Albumin 4.2 3.8 - 4.9 g/dL   Globulin, Total 2.0 1.5 - 4.5 g/dL   Albumin/Globulin Ratio 2.1 1.2 - 2.2   Bilirubin Total 0.2 0.0 - 1.2 mg/dL   Alkaline Phosphatase 71 39 - 117 IU/L   AST 23 0 - 40 IU/L   ALT 11 0 - 32 IU/L  CBC with Differential/Platelet  Result Value Ref Range   WBC 3.8 3.4 - 10.8 x10E3/uL   RBC 4.54 3.77 - 5.28 x10E6/uL   Hemoglobin 12.6 11.1 - 15.9 g/dL   Hematocrit 37.9 34.0 - 46.6 %   MCV 84 79 - 97 fL   MCH 27.8 26.6 - 33.0 pg   MCHC 33.2 31 - 35 g/dL   RDW 13.7 11.7 - 15.4 %   Platelets 286 150 - 450 x10E3/uL   Neutrophils 46 Not Estab. %   Lymphs 40 Not Estab. %   Monocytes 11 Not  Estab. %   Eos 2 Not Estab. %   Basos 1 Not Estab. %   Neutrophils Absolute 1.7 1 - 7 x10E3/uL   Lymphocytes Absolute 1.5 0 - 3 x10E3/uL   Monocytes Absolute 0.4 0 - 0 x10E3/uL   EOS (ABSOLUTE) 0.1 0.0 - 0.4 x10E3/uL   Basophils Absolute 0.0 0 - 0 x10E3/uL   Immature Granulocytes 0 Not Estab. %   Immature Grans (Abs) 0.0 0.0 - 0.1 x10E3/uL   Hematology Comments: Note:      Assessment/ Plan: 54 y.o. female   Robin Harrington was seen today for medical management of chronic issues.  Diagnoses and all orders for this visit:  Mixed hyperlipidemia Continue current therapy. Dash diet and exercise. Follow up in 6 months, or for new or worsening symptoms.  -     pravastatin (PRAVACHOL) 80 MG tablet; Take 1 tablet (80 mg total) by mouth daily. (Needs to be seen before next refill) -     co-enzyme Q-10 30 MG capsule; Take 1 capsule (30 mg total) by mouth daily.  Gastroesophageal reflux disease without esophagitis Continue current therapy. Avoid trigger foods. Follow up for new or worsening symptoms.  -     omeprazole (PRILOSEC) 20 MG capsule; Take 1 capsule (20 mg total) by mouth daily.  Fever blister -     valACYclovir (VALTREX) 1000 MG  tablet; Take 1 tablet (1,000 mg total) by mouth daily.  Seasonal allergies -     fluticasone (FLONASE) 50 MCG/ACT nasal spray; Place 2 sprays into both nostrils daily.  BMI 31.0-31.9,adult Diet and exercise discussed. Patient is aware to notify provider if she is interested referral to a weight loss clinic or weight loss medication. Follow up in 6 months  TMJ dysfunction Stress reduction. Night guard if tolerated. Follow up for new or worsening symptoms.  -     cyclobenzaprine (FLEXERIL) 10 MG tablet; Take 1 tablet (10 mg total) by mouth at bedtime.  Need for immunization against influenza -     Flu Vaccine QUAD 36+ mos IM  Need for Tdap vaccination -     Tdap vaccine greater than or equal to 7yo IM  Acne vulgaris Continue current therapy. Follow up for  new or worsening symptoms.  -     doxycycline (VIBRA-TABS) 100 MG tablet; Take 1 tablet (100 mg total) by mouth 2 (two) times daily.  Follow up in 6 months for CPE, sooner for new or worsening symptoms.   The above assessment and management plan was discussed with the patient. The patient verbalized understanding of and has agreed to the management plan. Patient is aware to call the clinic if symptoms persist or worsen. Patient is aware when to return to the clinic for a follow-up visit. Patient educated on when it is appropriate to go to the emergency department.   Robin Smolder, FNP-C [facility] [address] [place], [place] [address] [phone]

## 2020-07-12 NOTE — Patient Instructions (Signed)
DASH Eating Plan DASH stands for "Dietary Approaches to Stop Hypertension." The DASH eating plan is a healthy eating plan that has been shown to reduce high blood pressure (hypertension). It may also reduce your risk for type 2 diabetes, heart disease, and stroke. The DASH eating plan may also help with weight loss. What are tips for following this plan?  General guidelines  Avoid eating more than 2,300 mg (milligrams) of salt (sodium) a day. If you have hypertension, you may need to reduce your sodium intake to 1,500 mg a day.  Limit alcohol intake to no more than 1 drink a day for nonpregnant women and 2 drinks a day for men. One drink equals 12 oz of beer, 5 oz of wine, or 1 oz of hard liquor.  Work with your health care provider to maintain a healthy body weight or to lose weight. Ask what an ideal weight is for you.  Get at least 30 minutes of exercise that causes your heart to beat faster (aerobic exercise) most days of the week. Activities may include walking, swimming, or biking.  Work with your health care provider or diet and nutrition specialist (dietitian) to adjust your eating plan to your individual calorie needs. Reading food labels   Check food labels for the amount of sodium per serving. Choose foods with less than 5 percent of the Daily Value of sodium. Generally, foods with less than 300 mg of sodium per serving fit into this eating plan.  To find whole grains, look for the word "whole" as the first word in the ingredient list. Shopping  Buy products labeled as "low-sodium" or "no salt added."  Buy fresh foods. Avoid canned foods and premade or frozen meals. Cooking  Avoid adding salt when cooking. Use salt-free seasonings or herbs instead of table salt or sea salt. Check with your health care provider or pharmacist before using salt substitutes.  Do not fry foods. Cook foods using healthy methods such as baking, boiling, grilling, and broiling instead.  Cook with  heart-healthy oils, such as olive, canola, soybean, or sunflower oil. Meal planning  Eat a balanced diet that includes: ? 5 or more servings of fruits and vegetables each day. At each meal, try to fill half of your plate with fruits and vegetables. ? Up to 6-8 servings of whole grains each day. ? Less than 6 oz of lean meat, poultry, or fish each day. A 3-oz serving of meat is about the same size as a deck of cards. One egg equals 1 oz. ? 2 servings of low-fat dairy each day. ? A serving of nuts, seeds, or beans 5 times each week. ? Heart-healthy fats. Healthy fats called Omega-3 fatty acids are found in foods such as flaxseeds and coldwater fish, like sardines, salmon, and mackerel.  Limit how much you eat of the following: ? Canned or prepackaged foods. ? Food that is high in trans fat, such as fried foods. ? Food that is high in saturated fat, such as fatty meat. ? Sweets, desserts, sugary drinks, and other foods with added sugar. ? Full-fat dairy products.  Do not salt foods before eating.  Try to eat at least 2 vegetarian meals each week.  Eat more home-cooked food and less restaurant, buffet, and fast food.  When eating at a restaurant, ask that your food be prepared with less salt or no salt, if possible. What foods are recommended? The items listed may not be a complete list. Talk with your dietitian about   what dietary choices are best for you. Grains Whole-grain or whole-wheat bread. Whole-grain or whole-wheat pasta. Brown rice. Oatmeal. Quinoa. Bulgur. Whole-grain and low-sodium cereals. Pita bread. Low-fat, low-sodium crackers. Whole-wheat flour tortillas. Vegetables Fresh or frozen vegetables (raw, steamed, roasted, or grilled). Low-sodium or reduced-sodium tomato and vegetable juice. Low-sodium or reduced-sodium tomato sauce and tomato paste. Low-sodium or reduced-sodium canned vegetables. Fruits All fresh, dried, or frozen fruit. Canned fruit in natural juice (without  added sugar). Meat and other protein foods Skinless chicken or turkey. Ground chicken or turkey. Pork with fat trimmed off. Fish and seafood. Egg whites. Dried beans, peas, or lentils. Unsalted nuts, nut butters, and seeds. Unsalted canned beans. Lean cuts of beef with fat trimmed off. Low-sodium, lean deli meat. Dairy Low-fat (1%) or fat-free (skim) milk. Fat-free, low-fat, or reduced-fat cheeses. Nonfat, low-sodium ricotta or cottage cheese. Low-fat or nonfat yogurt. Low-fat, low-sodium cheese. Fats and oils Soft margarine without trans fats. Vegetable oil. Low-fat, reduced-fat, or light mayonnaise and salad dressings (reduced-sodium). Canola, safflower, olive, soybean, and sunflower oils. Avocado. Seasoning and other foods Herbs. Spices. Seasoning mixes without salt. Unsalted popcorn and pretzels. Fat-free sweets. What foods are not recommended? The items listed may not be a complete list. Talk with your dietitian about what dietary choices are best for you. Grains Baked goods made with fat, such as croissants, muffins, or some breads. Dry pasta or rice meal packs. Vegetables Creamed or fried vegetables. Vegetables in a cheese sauce. Regular canned vegetables (not low-sodium or reduced-sodium). Regular canned tomato sauce and paste (not low-sodium or reduced-sodium). Regular tomato and vegetable juice (not low-sodium or reduced-sodium). Pickles. Olives. Fruits Canned fruit in a light or heavy syrup. Fried fruit. Fruit in cream or butter sauce. Meat and other protein foods Fatty cuts of meat. Ribs. Fried meat. Bacon. Sausage. Bologna and other processed lunch meats. Salami. Fatback. Hotdogs. Bratwurst. Salted nuts and seeds. Canned beans with added salt. Canned or smoked fish. Whole eggs or egg yolks. Chicken or turkey with skin. Dairy Whole or 2% milk, cream, and half-and-half. Whole or full-fat cream cheese. Whole-fat or sweetened yogurt. Full-fat cheese. Nondairy creamers. Whipped toppings.  Processed cheese and cheese spreads. Fats and oils Butter. Stick margarine. Lard. Shortening. Ghee. Bacon fat. Tropical oils, such as coconut, palm kernel, or palm oil. Seasoning and other foods Salted popcorn and pretzels. Onion salt, garlic salt, seasoned salt, table salt, and sea salt. Worcestershire sauce. Tartar sauce. Barbecue sauce. Teriyaki sauce. Soy sauce, including reduced-sodium. Steak sauce. Canned and packaged gravies. Fish sauce. Oyster sauce. Cocktail sauce. Horseradish that you find on the shelf. Ketchup. Mustard. Meat flavorings and tenderizers. Bouillon cubes. Hot sauce and Tabasco sauce. Premade or packaged marinades. Premade or packaged taco seasonings. Relishes. Regular salad dressings. Where to find more information:  National Heart, Lung, and Blood Institute: www.nhlbi.nih.gov  American Heart Association: www.heart.org Summary  The DASH eating plan is a healthy eating plan that has been shown to reduce high blood pressure (hypertension). It may also reduce your risk for type 2 diabetes, heart disease, and stroke.  With the DASH eating plan, you should limit salt (sodium) intake to 2,300 mg a day. If you have hypertension, you may need to reduce your sodium intake to 1,500 mg a day.  When on the DASH eating plan, aim to eat more fresh fruits and vegetables, whole grains, lean proteins, low-fat dairy, and heart-healthy fats.  Work with your health care provider or diet and nutrition specialist (dietitian) to adjust your eating plan to your   individual calorie needs. This information is not intended to replace advice given to you by your health care provider. Make sure you discuss any questions you have with your health care provider. Document Revised: 09/18/2017 Document Reviewed: 09/29/2016 Elsevier Patient Education  2020 [employer]vier Inc.  

## 2020-08-21 ENCOUNTER — Other Ambulatory Visit: Payer: Self-pay

## 2020-08-21 DIAGNOSIS — K219 Gastro-esophageal reflux disease without esophagitis: Secondary | ICD-10-CM

## 2020-08-21 NOTE — Telephone Encounter (Signed)
  Prescription Request  08/21/2020  What is the name of the medication or equipment? prilosec  Have you contacted your pharmacy to request a refill? (if applicable) yes she wants to change pharmacy  Which pharmacy would you like this sent to? [facility]   Patient notified that their request is being sent to the clinical staff for review and that they should receive a response within 2 business days.

## 2020-08-22 MED ORDER — OMEPRAZOLE 20 MG PO CPDR: 20.0000 mg | DELAYED_RELEASE_CAPSULE | Freq: Every day | ORAL | 5 refills | Status: DC

## 2020-08-22 NOTE — Telephone Encounter (Signed)
Pt not able to use her benefit card at [facility] sending to [facility] in [place] to see if she can use it there.

## 2020-10-02 ENCOUNTER — Other Ambulatory Visit: Payer: Self-pay | Admitting: *Deleted

## 2020-10-02 DIAGNOSIS — M26609 Unspecified temporomandibular joint disorder, unspecified side: Secondary | ICD-10-CM

## 2020-10-02 MED ORDER — CYCLOBENZAPRINE HCL 10 MG PO TABS: 10.0000 mg | ORAL_TABLET | Freq: Every day | ORAL | 2 refills | Status: AC

## 2020-10-11 ENCOUNTER — Encounter: Payer: Self-pay | Admitting: Gastroenterology

## 2020-10-11 ENCOUNTER — Ambulatory Visit: Payer: BC Managed Care – PPO | Admitting: Family Medicine

## 2020-10-11 ENCOUNTER — Other Ambulatory Visit: Payer: Self-pay

## 2020-10-11 ENCOUNTER — Encounter: Payer: Self-pay | Admitting: Family Medicine

## 2020-10-11 VITALS — BP 119/77 | HR 66 | Temp 98.2°F | Ht 65.0 in | Wt 191.2 lb

## 2020-10-11 DIAGNOSIS — K219 Gastro-esophageal reflux disease without esophagitis: Secondary | ICD-10-CM | POA: Diagnosis not present

## 2020-10-11 DIAGNOSIS — R1319 Other dysphagia: Secondary | ICD-10-CM

## 2020-10-11 MED ORDER — OMEPRAZOLE 20 MG PO CPDR: 20.0000 mg | DELAYED_RELEASE_CAPSULE | Freq: Two times a day (BID) | ORAL | 2 refills | Status: DC

## 2020-10-11 NOTE — Progress Notes (Signed)
Subjective: CC: Dysphagia PCP: Robin Perking, FNP  VVO:HYWVP Robin Harrington is a 54 y.o. female presenting to clinic today for:  1. Dysphagia Robin Harrington reports trouble swallowing. She feels like something is stuck in her lower throat and then she has a coughing fit before it clears. It has been going on for a year or so, but this has become more frequent. This happens a few times a day. This occurs with foods of all types, liquids, and swallowing pills. She has a history of GERD and takes prilosec 20 mg occasionally. She reports frequent belching and heartburn most days. She denies vomiting or blood in stool. She fills anxious when the happens a feels likes she has trouble breathing when she has the coughing fit, she also feels like her heart races during this time. After it passes she feels like her heart is beating normally and has no difficulty breathing.   Relevant past medical, surgical, family, and social history reviewed and updated as indicated.  Allergies and medications reviewed and updated.  No Known Allergies Past Medical History:  Diagnosis Date  . Acne   . Allergy   . Hyperlipidemia     Current Outpatient Medications:  .  co-enzyme Q-10 30 MG capsule, Take 1 capsule (30 mg total) by mouth daily., Disp: 90 capsule, Rfl: 3 .  cyclobenzaprine (FLEXERIL) 10 MG tablet, Take 1 tablet (10 mg total) by mouth at bedtime., Disp: 90 tablet, Rfl: 2 .  doxycycline (VIBRA-TABS) 100 MG tablet, Take 1 tablet (100 mg total) by mouth 2 (two) times daily., Disp: 40 tablet, Rfl: 11 .  fluticasone (FLONASE) 50 MCG/ACT nasal spray, Place 2 sprays into both nostrils daily., Disp: 16 g, Rfl: 11 .  omeprazole (PRILOSEC) 20 MG capsule, Take 1 capsule (20 mg total) by mouth daily., Disp: 30 capsule, Rfl: 5 .  pravastatin (PRAVACHOL) 80 MG tablet, Take 1 tablet (80 mg total) by mouth daily. (Needs to be seen before next refill), Disp: 30 tablet, Rfl: 0 .  valACYclovir (VALTREX) 1000 MG tablet, Take 1  tablet (1,000 mg total) by mouth daily., Disp: 30 tablet, Rfl: 11 Social History   Socioeconomic History  . Marital status: Married    Spouse name: Not on file  . Number of children: Not on file  . Years of education: Not on file  . Highest education level: Not on file  Occupational History  . Not on file  Tobacco Use  . Smoking status: Never Smoker  . Smokeless tobacco: Never Used  Vaping Use  . Vaping Use: Never used  Substance and Sexual Activity  . Alcohol use: No  . Drug use: No  . Sexual activity: Not on file  Other Topics Concern  . Not on file  Social History Narrative  . Not on file   Social Determinants of Health   Financial Resource Strain: Not on file  Food Insecurity: Not on file  Transportation Needs: Not on file  Physical Activity: Not on file  Stress: Not on file  Social Connections: Not on file  Intimate Partner Violence: Not on file   Family History  Problem Relation Age of Onset  . Hypertension Mother   . Atrial fibrillation Mother   . Cancer Mother        melanoma   . Cancer Father        colon   . Heart disease Father     Review of Systems  Negative unless specially indicated above in HPI.  Objective: Office  vital signs reviewed. BP 119/77   Pulse 66   Temp 98.2 F (36.8 C) (Temporal)   Ht 5' 5" (1.651 m)   Wt 191 lb 4 oz (86.8 kg)   BMI 31.83 kg/m   Physical Examination:  Physical Exam Vitals and nursing note reviewed.  Constitutional:      General: She is not in acute distress.    Appearance: Normal appearance. She is not ill-appearing, toxic-appearing or diaphoretic.  HENT:     Head: Normocephalic and atraumatic.     Mouth/Throat:     Mouth: Mucous membranes are moist.     Pharynx: Oropharynx is clear.  Neck:     Vascular: No carotid bruit.  Cardiovascular:     Rate and Rhythm: Normal rate and regular rhythm.     Heart sounds: No murmur heard.   Abdominal:     General: There is no distension.     Palpations:  Abdomen is soft. There is no mass.     Tenderness: There is no abdominal tenderness. There is no guarding.  Musculoskeletal:     Right lower leg: No edema.     Left lower leg: No edema.  Skin:    General: Skin is warm and dry.     Capillary Refill: Capillary refill takes less than 2 seconds.  Neurological:     General: No focal deficit present.     Mental Status: She is alert and oriented to person, place, and time.  Psychiatric:        Mood and Affect: Mood normal.        Behavior: Behavior normal.        Thought Content: Thought content normal.        Judgment: Judgment normal.      Results for orders placed or performed in visit on 12/16/19  VITAMIN D 25 Hydroxy (Vit-D Deficiency, Fractures)  Result Value Ref Range   Vit D, 25-Hydroxy 28.9 (Robin) 30.0 - 100.0 ng/mL  TSH  Result Value Ref Range   TSH 3.370 0.450 - 4.500 uIU/mL  Lipid panel  Result Value Ref Range   Cholesterol, Total 189 100 - 199 mg/dL   Triglycerides 88 0 - 149 mg/dL   HDL 81 >39 mg/dL   VLDL Cholesterol Cal 16 5 - 40 mg/dL   LDL Chol Calc (NIH) 92 0 - 99 mg/dL   Chol/HDL Ratio 2.3 0.0 - 4.4 ratio  CMP14+EGFR  Result Value Ref Range   Glucose 92 65 - 99 mg/dL   BUN 16 6 - 24 mg/dL   Creatinine, Ser 0.78 0.57 - 1.00 mg/dL   GFR calc non Af Amer 87 >59 mL/min/1.73   GFR calc Af Amer 100 >59 mL/min/1.73   BUN/Creatinine Ratio 21 9 - 23   Sodium 143 134 - 144 mmol/Robin   Potassium 4.6 3.5 - 5.2 mmol/Robin   Chloride 107 (H) 96 - 106 mmol/Robin   CO2 24 20 - 29 mmol/Robin   Calcium 9.1 8.7 - 10.2 mg/dL   Total Protein 6.2 6.0 - 8.5 g/dL   Albumin 4.2 3.8 - 4.9 g/dL   Globulin, Total 2.0 1.5 - 4.5 g/dL   Albumin/Globulin Ratio 2.1 1.2 - 2.2   Bilirubin Total 0.2 0.0 - 1.2 mg/dL   Alkaline Phosphatase 71 39 - 117 IU/Robin   AST 23 0 - 40 IU/Robin   ALT 11 0 - 32 IU/Robin  CBC with Differential/Platelet  Result Value Ref Range   WBC 3.8 3.4 - 10.8 x10E3/uL  RBC 4.54 3.77 - 5.28 x10E6/uL   Hemoglobin 12.6 11.1 - 15.9 g/dL    Hematocrit 37.9 34.0 - 46.6 %   MCV 84 79 - 97 fL   MCH 27.8 26.6 - 33.0 pg   MCHC 33.2 31.5 - 35.7 g/dL   RDW 13.7 11.7 - 15.4 %   Platelets 286 150 - 450 x10E3/uL   Neutrophils 46 Not Estab. %   Lymphs 40 Not Estab. %   Monocytes 11 Not Estab. %   Eos 2 Not Estab. %   Basos 1 Not Estab. %   Neutrophils Absolute 1.7 1.4 - 7.0 x10E3/uL   Lymphocytes Absolute 1.5 0.7 - 3.1 x10E3/uL   Monocytes Absolute 0.4 0.1 - 0.9 x10E3/uL   EOS (ABSOLUTE) 0.1 0.0 - 0.4 x10E3/uL   Basophils Absolute 0.0 0.0 - 0.2 x10E3/uL   Immature Granulocytes 0 Not Estab. %   Immature Grans (Abs) 0.0 0.0 - 0.1 x10E3/uL   Hematology Comments: Note:      Assessment/ Plan: Mirah was seen today for choking.  Diagnoses and all orders for this visit:  Esophageal dysphagia Discussed likely caused by GERD and will likely need endoscopy. Take prilosec daily. Referral to GI.  -     Ambulatory referral to Gastroenterology  Gastroesophageal reflux disease without esophagitis -     Ambulatory referral to Gastroenterology -     omeprazole (PRILOSEC) 20 MG capsule; Take 1 capsule (20 mg total) by mouth 2 (two) times daily before a meal.  Return to office for new or worsening symptoms, or if symptoms persist.   Follow up in 3 months for CPE and pap.   The above assessment and management plan was discussed with the patient. The patient verbalized understanding of and has agreed to the management plan. Patient is aware to call the clinic if symptoms persist or worsen. Patient is aware when to return to the clinic for a follow-up visit. Patient educated on when it is appropriate to go to the emergency department.   Robin Smolder, Robin Harrington [facility] [address] [place], [place] [address] [phone]

## 2020-10-11 NOTE — Patient Instructions (Signed)
Dysphagia  Dysphagia is trouble swallowing. This condition occurs when solids and liquids stick in a person's throat on the way down to the stomach, or when food takes longer to get to the stomach than usual. You may have problems swallowing food, liquids, or both. You may also have pain while trying to swallow. It may take you more time and effort to swallow something. What are the causes? This condition may be caused by:  Muscle problems. They may make it difficult for you to move food and liquids through the esophagus, which is the tube that connects your mouth to your stomach.  Blockages. You may have ulcers, scar tissue, or inflammation that blocks the normal passage of food and liquids. Causes of these problems include: ? Acid reflux from your stomach into your esophagus (gastroesophageal reflux). ? Infections. ? Radiation treatment for cancer. ? Medicines taken without enough fluids to wash them down into your stomach.  Stroke. This can affect the nerves and make it difficult to swallow.  Nerve problems. These prevent signals from being sent to the muscles of your esophagus to squeeze (contract) and move what you swallow down to your stomach.  Globus pharyngeus. This is a common problem that involves a feeling like something is stuck in your throat or a sense of trouble with swallowing, even though nothing is wrong with the swallowing passages.  Certain conditions, such as cerebral palsy or Parkinson's disease. What are the signs or symptoms? Common symptoms of this condition include:  A feeling that solids or liquids are stuck in your throat on the way down to the stomach.  Pain while swallowing.  Coughing or gagging while trying to swallow. Other symptoms include:  Food moving back from your stomach to your mouth (regurgitation).  Noises coming from your throat.  Chest discomfort with swallowing.  A feeling of fullness when swallowing.  Drooling, especially when the  throat is blocked.  Heartburn. How is this diagnosed? This condition may be diagnosed by:  Barium X-ray. In this test, you will swallow a white liquid that sticks to the inside of your esophagus. X-ray images are then taken.  Endoscopy. In this test, a flexible telescope is inserted down your throat to look at your esophagus and your stomach.  CT scans and an MRI. How is this treated? Treatment for dysphagia depends on the cause of this condition, such as:  If the dysphagia is caused by acid reflux or infection, medicines may be used. They may include antibiotics and heartburn medicines.  If the dysphagia is caused by problems with the muscles, swallowing therapy may be used to help you strengthen your swallowing muscles. You may have to do specific exercises to strengthen the muscles or stretch them.  If the dysphagia is caused by a blockage or mass, procedures to remove the blockage may be done. You may need surgery and a feeding tube. You may need to make diet changes. Ask your health care provider for specific instructions. Follow these instructions at home: Medicines  Take over-the-counter and prescription medicines only as told by your health care provider.  If you were prescribed an antibiotic medicine, take it as told by your health care provider. Do not stop taking the antibiotic even if you start to feel better. Eating and drinking   Follow any diet changes as told by your health care provider.  Work with a diet and nutrition specialist (dietitian) to create an eating plan that will help you get the nutrients you need in   order to stay healthy.  Eat soft foods that are easier to swallow.  Cut your food into small pieces and eat slowly. Take small bites.  Eat and drink only when you are sitting upright.  Do not drink alcohol or caffeine. If you need help quitting, ask your health care provider. General instructions  Check your weight every day to make sure you are  not losing weight.  Do not use any products that contain nicotine or tobacco, such as cigarettes, e-cigarettes, and chewing tobacco. If you need help quitting, ask your health care provider.  Keep all follow-up visits as told by your health care provider. This is important. Contact a health care provider if you:  Lose weight because you cannot swallow.  Cough when you drink liquids.  Cough up partially digested food. Get help right away if you:  Cannot swallow your saliva.  Have shortness of breath, a fever, or both.  Have a hoarse voice and also have trouble swallowing. Summary  Dysphagia is trouble swallowing. This condition occurs when solids and liquids stick in a person's throat on the way down to the stomach. You may cough or gag while trying to swallow.  Dysphagia has many possible causes.  Treatment for dysphagia depends on the cause of the condition.  Keep all follow-up visits as told by your health care provider. This is important. This information is not intended to replace advice given to you by your health care provider. Make sure you discuss any questions you have with your health care provider. Document Revised: 03/02/2019 Document Reviewed: 03/02/2019 Elsevier Patient Education  2020 [employer].   Food Choices for Gastroesophageal Reflux Disease, Adult When you have gastroesophageal reflux disease (GERD), the foods you eat and your eating habits are very important. Choosing the right foods can help ease your discomfort. Think about working with a nutrition specialist (dietitian) to help you make good choices. What are tips for following this plan?  Meals  Choose healthy foods that are low in fat, such as fruits, vegetables, whole grains, low-fat dairy products, and lean meat, fish, and poultry.  Eat small meals often instead of 3 large meals a day. Eat your meals slowly, and in a place where you are relaxed. Avoid bending over or lying down until 2-3 hours  after eating.  Avoid eating meals 2-3 hours before bed.  Avoid drinking a lot of liquid with meals.  Cook foods using methods other than frying. Bake, grill, or broil food instead.  Avoid or limit: ? Chocolate. ? Peppermint or spearmint. ? Alcohol. ? Pepper. ? Black and decaffeinated coffee. ? Black and decaffeinated tea. ? Bubbly (carbonated) soft drinks. ? Caffeinated energy drinks and soft drinks.  Limit high-fat foods such as: ? Fatty meat or fried foods. ? Whole milk, cream, butter, or ice cream. ? Nuts and nut butters. ? Pastries, donuts, and sweets made with butter or shortening.  Avoid foods that cause symptoms. These foods may be different for everyone. Common foods that cause symptoms include: ? Tomatoes. ? Oranges, lemons, and limes. ? Peppers. ? Spicy food. ? Onions and garlic. ? Vinegar. Lifestyle  Maintain a healthy weight. Ask your doctor what weight is healthy for you. If you need to lose weight, work with your doctor to do so safely.  Exercise for at least 30 minutes for 5 or more days each week, or as told by your doctor.  Wear loose-fitting clothes.  Do not smoke. If you need help quitting, ask your doctor.  Sleep with the head of your bed higher than your feet. Use a wedge under the mattress or blocks under the bed frame to raise the head of the bed. Summary  When you have gastroesophageal reflux disease (GERD), food and lifestyle choices are very important in easing your symptoms.  Eat small meals often instead of 3 large meals a day. Eat your meals slowly, and in a place where you are relaxed.  Limit high-fat foods such as fatty meat or fried foods.  Avoid bending over or lying down until 2-3 hours after eating.  Avoid peppermint and spearmint, caffeine, alcohol, and chocolate. This information is not intended to replace advice given to you by your health care provider. Make sure you discuss any questions you have with your health care  provider. Document Revised: 01/27/2019 Document Reviewed: 11/11/2016 Elsevier Patient Education  2020 [employer].

## 2020-10-22 ENCOUNTER — Ambulatory Visit: Payer: BC Managed Care – PPO | Admitting: Gastroenterology

## 2020-10-29 ENCOUNTER — Encounter (INDEPENDENT_AMBULATORY_CARE_PROVIDER_SITE_OTHER): Payer: Self-pay | Admitting: *Deleted

## 2020-10-29 ENCOUNTER — Other Ambulatory Visit: Payer: Self-pay | Admitting: Family Medicine

## 2020-10-29 DIAGNOSIS — R1319 Other dysphagia: Secondary | ICD-10-CM

## 2020-11-05 ENCOUNTER — Ambulatory Visit: Payer: BC Managed Care – PPO | Admitting: Nurse Practitioner

## 2020-11-23 ENCOUNTER — Telehealth: Payer: Self-pay

## 2020-11-23 DIAGNOSIS — E782 Mixed hyperlipidemia: Secondary | ICD-10-CM

## 2020-11-23 DIAGNOSIS — J302 Other seasonal allergic rhinitis: Secondary | ICD-10-CM

## 2020-11-23 DIAGNOSIS — L700 Acne vulgaris: Secondary | ICD-10-CM

## 2020-11-23 NOTE — Telephone Encounter (Signed)
Pt has new insurance so she had to switch pharmacies. Pt now uses [facility] in [place] and needs all of her Rx's/refills sent there.

## 2020-11-26 MED ORDER — PRAVASTATIN SODIUM 80 MG PO TABS: 80.0000 mg | ORAL_TABLET | Freq: Every day | ORAL | 1 refills | Status: DC

## 2020-11-26 MED ORDER — FLUTICASONE PROPIONATE 50 MCG/ACT NA SUSP: 2.0000 | Freq: Every day | NASAL | 1 refills | Status: DC

## 2020-11-26 MED ORDER — DOXYCYCLINE HYCLATE 100 MG PO TABS: 100.0000 mg | ORAL_TABLET | Freq: Two times a day (BID) | ORAL | 1 refills | Status: DC

## 2020-11-26 NOTE — Telephone Encounter (Signed)
Left message advising rx's sent to pharmacy per pt request and to call back with any questions or concerns.

## 2020-12-13 ENCOUNTER — Encounter: Payer: Self-pay | Admitting: *Deleted

## 2021-01-09 NOTE — Progress Notes (Signed)
[name] [address] [place] [address] Phone: [phone] Subjective:   [name], am serving as a scribe for Dr. Antoine Primas. This visit occurred during the SARS-CoV-2 public health emergency.  Safety protocols were in place, including screening questions prior to the visit, additional usage of staff PPE, and extensive cleaning of exam room while observing appropriate contact time as indicated for disinfecting solutions.   I'm seeing this patient by the request  of:  [name], FNP  CC: Knee pain follow-up  BTD:[id number]   04/13/2019 Both knees.  Very mild.,  Topical anti-inflammatories prescribed.  Discussed strengthening of the VMO and hip abductors.  With proper shoes.  Stressed icing regimen, patient will follow-up again in 4 to 6 weeks.  Future considerations will include injections, bracing, formal physical therapy.  Update 01/10/2021 Robin Harrington is a 55 y.o. female coming in with complaint of B knee pain. L>R. States that she plays pickleball every day. Since February, she has had increase in pain. After tournament in March which caused a lot more pain which occurred after playing. Pain over medial aspect that is shooting in nature. Has been wearing knee brace for support. Patient is able to [facility] dance without pain.        Past Medical History:  Diagnosis Date  . Acne   . Allergy   . Hyperlipidemia    No past surgical history on file. Social History   Socioeconomic History  . Marital status: Married    Spouse name: Not on file  . Number of children: Not on file  . Years of education: Not on file  . Highest education level: Not on file  Occupational History  . Not on file  Tobacco Use  . Smoking status: Never Smoker  . Smokeless tobacco: Never Used  Vaping Use  . Vaping Use: Never used  Substance and Sexual Activity  . Alcohol use: No  . Drug use: No  . Sexual activity: Not on file  Other Topics  Concern  . Not on file  Social History Narrative  . Not on file   Social Determinants of Health   Financial Resource Strain: Not on file  Food Insecurity: Not on file  Transportation Needs: Not on file  Physical Activity: Not on file  Stress: Not on file  Social Connections: Not on file   No Known Allergies Family History  Problem Relation Age of Onset  . Hypertension Mother   . Atrial fibrillation Mother   . Cancer Mother        melanoma   . Cancer Father        colon   . Heart disease Father      Current Outpatient Medications (Cardiovascular):  .  pravastatin (PRAVACHOL) 80 MG tablet, Take 1 tablet (80 mg total) by mouth daily. (Needs to be seen before next refill)  Current Outpatient Medications (Respiratory):  .  fluticasone (FLONASE) 50 MCG/ACT nasal spray, Place 2 sprays into both nostrils daily.  Current Outpatient Medications (Analgesics):  .  meloxicam (MOBIC) 7.5 MG tablet, Take 1 tablet (7.5 mg total) by mouth daily.   Current Outpatient Medications (Other):  .  co-enzyme Q-10 30 MG capsule, Take 1 capsule (30 mg total) by mouth daily. .  cyclobenzaprine (FLEXERIL) 10 MG tablet, Take 1 tablet (10 mg total) by mouth at bedtime. Marland Kitchen  doxycycline (VIBRA-TABS) 100 MG tablet, Take 1 tablet (100 mg total) by mouth 2 (two) times daily. Marland Kitchen  omeprazole (PRILOSEC) 20 MG capsule, Take 1 capsule (20 mg total) by mouth 2 (two) times daily before a meal. .  valACYclovir (VALTREX) 1000 MG tablet, Take 1 tablet (1,000 mg total) by mouth daily.   Reviewed prior external information including notes and imaging from  primary care provider As well as notes that were available from care everywhere and other healthcare systems.  Past medical history, social, surgical and family history all reviewed in electronic medical record.  No pertanent information unless stated regarding to the chief complaint.   Review of Systems:  No headache, visual changes, nausea, vomiting, diarrhea,  constipation, dizziness, abdominal pain, skin rash, fevers, chills, night sweats, weight loss, swollen lymph nodes, body aches, joint swelling, chest pain, shortness of breath, mood changes. POSITIVE muscle aches  Objective  Blood pressure 128/82, pulse 65, height 5\' 5"  (1.651 m), weight 189 lb (85.7 kg), SpO2 98 %.   General: No apparent distress alert and oriented x3 mood and affect normal, dressed appropriately.  HEENT: Pupils equal, extraocular movements intact  Respiratory: Patient's speak in full sentences and does not appear short of breath  Cardiovascular: No lower extremity edema, non tender, no erythema  Gait normal with good balance and coordination.  MSK: Left knee exam shows the patient does have crepitus noted.  Patient does have positive patellar grind test noted.  Very minimal pain over the medial joint space.  Trace effusion noted of the patellofemoral joint.  Does have full range of motion.  Negative McMurray's.  Contralateral knee very mild crepitus but nontender.  Limited musculoskeletal ultrasound was performed and interpreted by  Limited ultrasound of patient's left knee shows that there is moderate narrowing of the patellofemoral joint with synovitis and hypoechoic changes consistent with a effusion.  Patient also has some mild to moderate narrowing of the medial joint space.  Mild degenerative changes of the medial meniscus but no true acute tear appreciated. Impression: Patellofemoral arthritis with synovitis    Impression and Recommendations:     The above documentation has been reviewed and is accurate and complete Judi Saa, DO

## 2021-01-10 ENCOUNTER — Other Ambulatory Visit: Payer: Self-pay

## 2021-01-10 ENCOUNTER — Encounter: Payer: Self-pay | Admitting: Family Medicine

## 2021-01-10 ENCOUNTER — Ambulatory Visit: Payer: BC Managed Care – PPO | Admitting: Family Medicine

## 2021-01-10 ENCOUNTER — Ambulatory Visit (INDEPENDENT_AMBULATORY_CARE_PROVIDER_SITE_OTHER): Payer: BC Managed Care – PPO

## 2021-01-10 ENCOUNTER — Ambulatory Visit: Payer: Self-pay

## 2021-01-10 VITALS — BP 128/82 | HR 65 | Ht 65.0 in | Wt 189.0 lb

## 2021-01-10 DIAGNOSIS — G8929 Other chronic pain: Secondary | ICD-10-CM

## 2021-01-10 DIAGNOSIS — M25561 Pain in right knee: Secondary | ICD-10-CM

## 2021-01-10 DIAGNOSIS — M25562 Pain in left knee: Secondary | ICD-10-CM | POA: Diagnosis not present

## 2021-01-10 DIAGNOSIS — M1710 Unilateral primary osteoarthritis, unspecified knee: Secondary | ICD-10-CM | POA: Diagnosis not present

## 2021-01-10 DIAGNOSIS — E782 Mixed hyperlipidemia: Secondary | ICD-10-CM

## 2021-01-10 MED ORDER — MELOXICAM 7.5 MG PO TABS: 7.5000 mg | ORAL_TABLET | Freq: Every day | ORAL | 0 refills | Status: DC

## 2021-01-10 NOTE — Assessment & Plan Note (Signed)
Patient has had some progression since we have seen patient last.  Seems to be moderate on the left side at this point with patient having effusion noted.  Short course of meloxicam given.  Warned of potential side effects.  Patient given a Tru pull lite brace so she can continue to work out.  Encourage weight loss.  Follow-up again in 6 weeks.  Continue pain consider formal physical therapy or injection

## 2021-01-10 NOTE — Patient Instructions (Signed)
  Wear brace with activity Xray today Meloxicam 7.5 daily for 10 days then as needed Ice Exercises See me in 6 weeks

## 2021-01-11 ENCOUNTER — Encounter: Payer: Self-pay | Admitting: Family Medicine

## 2021-01-11 ENCOUNTER — Other Ambulatory Visit (HOSPITAL_COMMUNITY)
Admission: RE | Admit: 2021-01-11 | Discharge: 2021-01-11 | Disposition: A | Discharge status: Home / Self Care | Payer: BC Managed Care – PPO | Source: Ambulatory Visit | Attending: Family Medicine | Admitting: Family Medicine

## 2021-01-11 ENCOUNTER — Ambulatory Visit (INDEPENDENT_AMBULATORY_CARE_PROVIDER_SITE_OTHER): Payer: BC Managed Care – PPO | Admitting: Family Medicine

## 2021-01-11 VITALS — BP 111/67 | Ht 65.0 in | Wt 190.4 lb

## 2021-01-11 DIAGNOSIS — E782 Mixed hyperlipidemia: Secondary | ICD-10-CM | POA: Diagnosis not present

## 2021-01-11 DIAGNOSIS — K629 Disease of anus and rectum, unspecified: Secondary | ICD-10-CM | POA: Insufficient documentation

## 2021-01-11 DIAGNOSIS — K219 Gastro-esophageal reflux disease without esophagitis: Secondary | ICD-10-CM | POA: Insufficient documentation

## 2021-01-11 DIAGNOSIS — Z124 Encounter for screening for malignant neoplasm of cervix: Secondary | ICD-10-CM | POA: Insufficient documentation

## 2021-01-11 DIAGNOSIS — L700 Acne vulgaris: Secondary | ICD-10-CM | POA: Diagnosis not present

## 2021-01-11 DIAGNOSIS — Z01411 Encounter for gynecological examination (general) (routine) with abnormal findings: Secondary | ICD-10-CM | POA: Diagnosis not present

## 2021-01-11 DIAGNOSIS — Z1211 Encounter for screening for malignant neoplasm of colon: Secondary | ICD-10-CM

## 2021-01-11 DIAGNOSIS — K601 Chronic anal fissure: Secondary | ICD-10-CM | POA: Insufficient documentation

## 2021-01-11 DIAGNOSIS — Z1231 Encounter for screening mammogram for malignant neoplasm of breast: Secondary | ICD-10-CM

## 2021-01-11 DIAGNOSIS — K5900 Constipation, unspecified: Secondary | ICD-10-CM | POA: Insufficient documentation

## 2021-01-11 DIAGNOSIS — K594 Anal spasm: Secondary | ICD-10-CM | POA: Insufficient documentation

## 2021-01-11 DIAGNOSIS — Z01419 Encounter for gynecological examination (general) (routine) without abnormal findings: Secondary | ICD-10-CM

## 2021-01-11 LAB — CMP14+EGFR
ALT: 13 IU/L (ref 0–32)
AST: 17 IU/L (ref 0–40)
Albumin/Globulin Ratio: 2.1 (ref 1.2–2.2)
Albumin: 4.6 g/dL (ref 3.8–4.9)
Alkaline Phosphatase: 59 IU/L (ref 44–121)
BUN/Creatinine Ratio: 18 (ref 9–23)
BUN: 15 mg/dL (ref 6–24)
Bilirubin Total: 0.3 mg/dL (ref 0.0–1.2)
CO2: 23 mmol/L (ref 20–29)
Calcium: 9.7 mg/dL (ref 8.7–10.2)
Chloride: 102 mmol/L (ref 96–106)
Creatinine, Ser: 0.83 mg/dL (ref 0.57–1.00)
Globulin, Total: 2.2 g/dL (ref 1.5–4.5)
Glucose: 99 mg/dL (ref 65–99)
Potassium: 4.4 mmol/L (ref 3.5–5.2)
Sodium: 141 mmol/L (ref 134–144)
Total Protein: 6.8 g/dL (ref 6.0–8.5)
eGFR: 83 mL/min/{1.73_m2} (ref >59)

## 2021-01-11 LAB — CBC WITH DIFFERENTIAL/PLATELET
Basophils Absolute: 0 10*3/uL (ref 0.0–0.2)
Basos: 1 %
EOS (ABSOLUTE): 0.1 10*3/uL (ref 0.0–0.4)
Eos: 3 %
Hematocrit: 40 % (ref 34.0–46.6)
Hemoglobin: 13 g/dL (ref 11.1–15.9)
Immature Grans (Abs): 0 10*3/uL (ref 0.0–0.1)
Immature Granulocytes: 0 %
Lymphocytes Absolute: 1.4 10*3/uL (ref 0.7–3.1)
Lymphs: 39 %
MCH: 26.2 pg — ABNORMAL LOW (ref 26.6–33.0)
MCHC: 32.5 g/dL (ref 31.5–35.7)
MCV: 81 fL (ref 79–97)
Monocytes Absolute: 0.4 10*3/uL (ref 0.1–0.9)
Monocytes: 10 %
Neutrophils Absolute: 1.7 10*3/uL (ref 1.4–7.0)
Neutrophils: 47 %
Platelets: 312 10*3/uL (ref 150–450)
RBC: 4.96 x10E6/uL (ref 3.77–5.28)
RDW: 13 % (ref 11.7–15.4)
WBC: 3.6 10*3/uL (ref 3.4–10.8)

## 2021-01-11 LAB — LIPID PANEL
Chol/HDL Ratio: 4.3 ratio (ref 0.0–4.4)
Cholesterol, Total: 309 mg/dL — ABNORMAL HIGH (ref 100–199)
HDL: 72 mg/dL (ref >39)
LDL Chol Calc (NIH): 215 mg/dL — ABNORMAL HIGH (ref 0–99)
Triglycerides: 125 mg/dL (ref 0–149)
VLDL Cholesterol Cal: 22 mg/dL (ref 5–40)

## 2021-01-11 MED ORDER — ROSUVASTATIN CALCIUM 40 MG PO TABS: 40.0000 mg | ORAL_TABLET | Freq: Every day | ORAL | 3 refills | Status: DC

## 2021-01-11 MED ORDER — DOXYCYCLINE 40 MG PO CPDR: 40.0000 mg | DELAYED_RELEASE_CAPSULE | Freq: Every day | ORAL | 6 refills | Status: DC

## 2021-01-11 NOTE — Progress Notes (Signed)
Robin Harrington is a 55 y.o. female presents to office today for annual physical exam examination.    Concerns today include: Cholesterol. She is currently taking pravastatin because she was thinking that crestor was causing her knee pain. She know knows that that was not the cause of her knee pain. She would like to go back to crestor as it controlled her cholesterol better.   Diet: improving, she has met with a dietian and is working to eat out less, Exercise: pickleball Last colonoscopy: 2019, needs in June of this year due to family history and polyps.  Last mammogram: 2019 Last pap smear: 10 years ago Refills needed today: doxycycline   Past Medical History:  Diagnosis Date  . Acne   . Allergy   . Hyperlipidemia    Social History   Socioeconomic History  . Marital status: Married    Spouse name: Not on file  . Number of children: Not on file  . Years of education: Not on file  . Highest education level: Not on file  Occupational History  . Not on file  Tobacco Use  . Smoking status: Never Smoker  . Smokeless tobacco: Never Used  Vaping Use  . Vaping Use: Never used  Substance and Sexual Activity  . Alcohol use: No  . Drug use: No  . Sexual activity: Not on file  Other Topics Concern  . Not on file  Social History Narrative  . Not on file   Social Determinants of Health   Financial Resource Strain: Not on file  Food Insecurity: Not on file  Transportation Needs: Not on file  Physical Activity: Not on file  Stress: Not on file  Social Connections: Not on file  Intimate Partner Violence: Not on file   No past surgical history on file. Family History  Problem Relation Age of Onset  . Hypertension Mother   . Atrial fibrillation Mother   . Cancer Mother        melanoma   . Cancer Father        colon   . Heart disease Father     Current Outpatient Medications:  .  co-enzyme Q-10 30 MG capsule, Take 1 capsule (30 mg total) by mouth daily., Disp: 90  capsule, Rfl: 3 .  cyclobenzaprine (FLEXERIL) 10 MG tablet, Take 1 tablet (10 mg total) by mouth at bedtime., Disp: 90 tablet, Rfl: 2 .  doxycycline (ORACEA) 40 MG capsule, Take 40 mg by mouth daily., Disp: , Rfl:  .  fluticasone (FLONASE) 50 MCG/ACT nasal spray, Place 2 sprays into both nostrils daily., Disp: 16 g, Rfl: 1 .  meloxicam (MOBIC) 7.5 MG tablet, Take 1 tablet (7.5 mg total) by mouth daily., Disp: 30 tablet, Rfl: 0 .  omeprazole (PRILOSEC) 20 MG capsule, Take 1 capsule (20 mg total) by mouth 2 (two) times daily before a meal., Disp: 90 capsule, Rfl: 2 .  pravastatin (PRAVACHOL) 80 MG tablet, Take 1 tablet (80 mg total) by mouth daily. (Needs to be seen before next refill), Disp: 30 tablet, Rfl: 1 .  valACYclovir (VALTREX) 1000 MG tablet, Take 1 tablet (1,000 mg total) by mouth daily., Disp: 30 tablet, Rfl: 11  No Known Allergies   ROS: Review of Systems Pertinent items noted in HPI and remainder of comprehensive ROS otherwise negative.    Physical exam Ht $Remov'5\' 5"'waEkKX$  (1.651 m)   Wt 190 lb 6 oz (86.4 kg)   BMI 31.68 kg/m  General appearance: severe distress Head: Normocephalic,  without obvious abnormality, atraumatic, scalp lesions Eyes: conjunctivae/corneas clear. PERRL, EOM's intact. Fundi benign. Ears: normal TM's and external ear canals both ears Nose: Nares normal. Septum midline. Mucosa normal. No drainage or sinus tenderness. Throat: lips, mucosa, and tongue normal; teeth and gums normal Neck: no adenopathy, no carotid bruit, no JVD, supple, symmetrical, trachea midline and thyroid not enlarged, symmetric, no tenderness/mass/nodules Back: symmetric, no curvature. ROM normal. No CVA tenderness. Lungs: clear to auscultation bilaterally Breasts: normal appearance, no masses or tenderness Heart: regular rate and rhythm, S1, S2 normal, no murmur, click, rub or gallop Abdomen: soft, non-tender; bowel sounds normal; no masses,  no organomegaly Pelvic: cervix normal in appearance,  external genitalia normal, no adnexal masses or tenderness, no cervical motion tenderness, uterus normal size, shape, and consistency and vagina normal without discharge Pulses: 2+ and symmetric Skin: Skin color, texture, turgor normal. No rashes or lesions Lymph nodes: Cervical, supraclavicular, and axillary nodes normal.    Assessment/ Plan: Robin Harrington here for annual physical exam.   Robin Harrington was seen today for annual exam.  Diagnoses and all orders for this visit:  Well woman exam with routine gynecological exam Exam unremarkable today. Completed lab work earlier this week.   Cervical cancer screening -     Cytology - PAP  Acne vulgaris Refill provided.  -     doxycycline (ORACEA) 40 MG capsule; Take 1 capsule (40 mg total) by mouth daily.  Mixed hyperlipidemia LDL increased from 98 to 215. Change from pravastatin to crestor. Recheck lipid panel in 6 weeks.  -     rosuvastatin (CRESTOR) 40 MG tablet; Take 1 tablet (40 mg total) by mouth daily. -     Lipid panel; Future  Colon cancer screening -     Ambulatory referral to Gastroenterology  Encounter for screening mammogram for malignant neoplasm of breast -     MM Digital Screening; Future   Counseled on healthy lifestyle choices, including diet (rich in fruits, vegetables and lean meats and low in salt and simple carbohydrates) and exercise (at least 30 minutes of moderate physical activity daily).  Patient to follow up in 1 year for annual exam or sooner if needed.  The above assessment and management plan was discussed with the patient. The patient verbalized understanding of and has agreed to the management plan. Patient is aware to call the clinic if symptoms persist or worsen. Patient is aware when to return to the clinic for a follow-up visit. Patient educated on when it is appropriate to go to the emergency department.   Robin Smolder, FNP-C [facility] [address] [place],  [place] [address] [phone]

## 2021-01-11 NOTE — Patient Instructions (Signed)
 Health Maintenance, Female Adopting a healthy lifestyle and getting preventive care are important in promoting health and wellness. Ask your health care provider about:  The right schedule for you to have regular tests and exams.  Things you can do on your own to prevent diseases and keep yourself healthy. What should I know about diet, weight, and exercise? Eat a healthy diet  Eat a diet that includes plenty of vegetables, fruits, low-fat dairy products, and lean protein.  Do not eat a lot of foods that are high in solid fats, added sugars, or sodium.   Maintain a healthy weight Body mass index (BMI) is used to identify weight problems. It estimates body fat based on height and weight. Your health care provider can help determine your BMI and help you achieve or maintain a healthy weight. Get regular exercise Get regular exercise. This is one of the most important things you can do for your health. Most adults should:  Exercise for at least 150 minutes each week. The exercise should increase your heart rate and make you sweat (moderate-intensity exercise).  Do strengthening exercises at least twice a week. This is in addition to the moderate-intensity exercise.  Spend less time sitting. Even light physical activity can be beneficial. Watch cholesterol and blood lipids Have your blood tested for lipids and cholesterol at 55 years of age, then have this test every 5 years. Have your cholesterol levels checked more often if:  Your lipid or cholesterol levels are high.  You are older than 55 years of age.  You are at high risk for heart disease. What should I know about cancer screening? Depending on your health history and family history, you may need to have cancer screening at various ages. This may include screening for:  Breast cancer.  Cervical cancer.  Colorectal cancer.  Skin cancer.  Lung cancer. What should I know about heart disease, diabetes, and high blood  pressure? Blood pressure and heart disease  High blood pressure causes heart disease and increases the risk of stroke. This is more likely to develop in people who have high blood pressure readings, are of African descent, or are overweight.  Have your blood pressure checked: ? Every 3-5 years if you are 18-39 years of age. ? Every year if you are 40 years old or older. Diabetes Have regular diabetes screenings. This checks your fasting blood sugar level. Have the screening done:  Once every three years after age 40 if you are at a normal weight and have a low risk for diabetes.  More often and at a younger age if you are overweight or have a high risk for diabetes. What should I know about preventing infection? Hepatitis B If you have a higher risk for hepatitis B, you should be screened for this virus. Talk with your health care provider to find out if you are at risk for hepatitis B infection. Hepatitis C Testing is recommended for:  Everyone born from 1945 through 1965.  Anyone with known risk factors for hepatitis C. Sexually transmitted infections (STIs)  Get screened for STIs, including gonorrhea and chlamydia, if: ? You are sexually active and are younger than 55 years of age. ? You are older than 55 years of age and your health care provider tells you that you are at risk for this type of infection. ? Your sexual activity has changed since you were last screened, and you are at increased risk for chlamydia or gonorrhea. Ask your health care   provider if you are at risk.  Ask your health care provider about whether you are at high risk for HIV. Your health care provider may recommend a prescription medicine to help prevent HIV infection. If you choose to take medicine to prevent HIV, you should first get tested for HIV. You should then be tested every 3 months for as long as you are taking the medicine. Pregnancy  If you are about to stop having your period (premenopausal) and  you may become pregnant, seek counseling before you get pregnant.  Take 400 to 800 micrograms (mcg) of folic acid every day if you become pregnant.  Ask for birth control (contraception) if you want to prevent pregnancy. Osteoporosis and menopause Osteoporosis is a disease in which the bones lose minerals and strength with aging. This can result in bone fractures. If you are 65 years old or older, or if you are at risk for osteoporosis and fractures, ask your health care provider if you should:  Be screened for bone loss.  Take a calcium or vitamin D supplement to lower your risk of fractures.  Be given hormone replacement therapy (HRT) to treat symptoms of menopause. Follow these instructions at home: Lifestyle  Do not use any products that contain nicotine or tobacco, such as cigarettes, e-cigarettes, and chewing tobacco. If you need help quitting, ask your health care provider.  Do not use street drugs.  Do not share needles.  Ask your health care provider for help if you need support or information about quitting drugs. Alcohol use  Do not drink alcohol if: ? Your health care provider tells you not to drink. ? You are pregnant, may be pregnant, or are planning to become pregnant.  If you drink alcohol: ? Limit how much you use to 0-1 drink a day. ? Limit intake if you are breastfeeding.  Be aware of how much alcohol is in your drink. In the [place] one drink equals one 12 oz bottle of beer (355 mL), one 5 oz glass of wine (148 mL), or one 1 oz glass of hard liquor (44 mL). General instructions  Schedule regular health, dental, and eye exams.  Stay current with your vaccines.  Tell your health care provider if: ? You often feel depressed. ? You have ever been abused or do not feel safe at home. Summary  Adopting a healthy lifestyle and getting preventive care are important in promoting health and wellness.  Follow your health care provider's instructions about healthy  diet, exercising, and getting tested or screened for diseases.  Follow your health care provider's instructions on monitoring your cholesterol and blood pressure. This information is not intended to replace advice given to you by your health care provider. Make sure you discuss any questions you have with your health care provider. Document Revised: 09/29/2018 Document Reviewed: 09/29/2018 Elsevier Patient Education  2021 Elsevier Inc.     Why follow it? Research shows. . Those who follow the Mediterranean diet have a reduced risk of heart disease  . The diet is associated with a reduced incidence of Parkinson's and Alzheimer's diseases . People following the diet may have longer life expectancies and lower rates of chronic diseases  . The Dietary Guidelines for Americans recommends the Mediterranean diet as an eating plan to promote health and prevent disease  What Is the Mediterranean Diet?  . Healthy eating plan based on typical foods and recipes of Mediterranean-style cooking . The diet is primarily a plant based diet; these foods should   make up a majority of meals   Starches - Plant based foods should make up a majority of meals - They are an important sources of vitamins, minerals, energy, antioxidants, and fiber - Choose whole grains, foods high in fiber and minimally processed items  - Typical grain sources include wheat, oats, barley, corn, brown rice, bulgar, farro, millet, polenta, couscous  - Various types of beans include chickpeas, lentils, fava beans, black beans, white beans   Fruits  Veggies - Large quantities of antioxidant rich fruits & veggies; 6 or more servings  - Vegetables can be eaten raw or lightly drizzled with oil and cooked  - Vegetables common to the traditional Mediterranean Diet include: artichokes, arugula, beets, broccoli, brussel sprouts, cabbage, carrots, celery, collard greens, cucumbers, eggplant, kale, leeks, lemons, lettuce, mushrooms, okra, onions,  peas, peppers, potatoes, pumpkin, radishes, rutabaga, shallots, spinach, sweet potatoes, turnips, zucchini - Fruits common to the Mediterranean Diet include: apples, apricots, avocados, cherries, clementines, dates, figs, grapefruits, grapes, melons, nectarines, oranges, peaches, pears, pomegranates, strawberries, tangerines  Fats - Replace butter and margarine with healthy oils, such as olive oil, canola oil, and tahini  - Limit nuts to no more than a handful a day  - Nuts include walnuts, almonds, pecans, pistachios, pine nuts  - Limit or avoid candied, honey roasted or heavily salted nuts - Olives are central to the Mediterranean diet - can be eaten whole or used in a variety of dishes   Meats Protein - Limiting red meat: no more than a few times a month - When eating red meat: choose lean cuts and keep the portion to the size of deck of cards - Eggs: approx. 0 to 4 times a week  - Fish and lean poultry: at least 2 a week  - Healthy protein sources include, chicken, turkey, lean beef, lamb - Increase intake of seafood such as tuna, salmon, trout, mackerel, shrimp, scallops - Avoid or limit high fat processed meats such as sausage and bacon  Dairy - Include moderate amounts of low fat dairy products  - Focus on healthy dairy such as fat free yogurt, skim milk, low or reduced fat cheese - Limit dairy products higher in fat such as whole or 2% milk, cheese, ice cream  Alcohol - Moderate amounts of red wine is ok  - No more than 5 oz daily for women (all ages) and men older than age 65  - No more than 10 oz of wine daily for men younger than 65  Other - Limit sweets and other desserts  - Use herbs and spices instead of salt to flavor foods  - Herbs and spices common to the traditional Mediterranean Diet include: basil, bay leaves, chives, cloves, cumin, fennel, garlic, lavender, marjoram, mint, oregano, parsley, pepper, rosemary, sage, savory, sumac, tarragon, thyme   It's not just a diet,  it's a lifestyle:  . The Mediterranean diet includes lifestyle factors typical of those in the region  . Foods, drinks and meals are best eaten with others and savored . Daily physical activity is important for overall good health . This could be strenuous exercise like running and aerobics . This could also be more leisurely activities such as walking, housework, yard-work, or taking the stairs . Moderation is the key; a balanced and healthy diet accommodates most foods and drinks . Consider portion sizes and frequency of consumption of certain foods   Meal Ideas & Options:  . Breakfast:  o Whole wheat toast or whole wheat English   muffins with peanut butter & hard boiled egg o Steel cut oats topped with apples & cinnamon and skim milk  o Fresh fruit: banana, strawberries, melon, berries, peaches  o Smoothies: strawberries, bananas, greek yogurt, peanut butter o Low fat greek yogurt with blueberries and granola  o Egg white omelet with spinach and mushrooms o Breakfast couscous: whole wheat couscous, apricots, skim milk, cranberries  . Sandwiches:  o Hummus and grilled vegetables (peppers, zucchini, squash) on whole wheat bread   o Grilled chicken on whole wheat pita with lettuce, tomatoes, cucumbers or tzatziki  o Tuna salad on whole wheat bread: tuna salad made with greek yogurt, olives, red peppers, capers, green onions o Garlic rosemary lamb pita: lamb sauted with garlic, rosemary, salt & pepper; add lettuce, cucumber, greek yogurt to pita - flavor with lemon juice and black pepper  . Seafood:  o Mediterranean grilled salmon, seasoned with garlic, basil, parsley, lemon juice and black pepper o Shrimp, lemon, and spinach whole-grain pasta salad made with low fat greek yogurt  o Seared scallops with lemon orzo  o Seared tuna steaks seasoned salt, pepper, coriander topped with tomato mixture of olives, tomatoes, olive oil, minced garlic, parsley, green onions and cappers  . Meats:   o Herbed greek chicken salad with kalamata olives, cucumber, feta  o Red bell peppers stuffed with spinach, bulgur, lean ground beef (or lentils) & topped with feta   o Kebabs: skewers of chicken, tomatoes, onions, zucchini, squash  o Turkey burgers: made with red onions, mint, dill, lemon juice, feta cheese topped with roasted red peppers . Vegetarian o Cucumber salad: cucumbers, artichoke hearts, celery, red onion, feta cheese, tossed in olive oil & lemon juice  o Hummus and whole grain pita points with a greek salad (lettuce, tomato, feta, olives, cucumbers, red onion) o Lentil soup with celery, carrots made with vegetable broth, garlic, salt and pepper  o Tabouli salad: parsley, bulgur, mint, scallions, cucumbers, tomato, radishes, lemon juice, olive oil, salt and pepper.      

## 2021-01-15 ENCOUNTER — Telehealth: Payer: Self-pay | Admitting: Family Medicine

## 2021-01-15 LAB — CYTOLOGY - PAP: Diagnosis: NEGATIVE

## 2021-01-31 ENCOUNTER — Ambulatory Visit (INDEPENDENT_AMBULATORY_CARE_PROVIDER_SITE_OTHER): Payer: BC Managed Care – PPO | Admitting: Gastroenterology

## 2021-02-07 ENCOUNTER — Ambulatory Visit (INDEPENDENT_AMBULATORY_CARE_PROVIDER_SITE_OTHER): Payer: BC Managed Care – PPO | Admitting: Gastroenterology

## 2021-02-25 NOTE — Progress Notes (Signed)
[name] [address] [place] [address] Phone: [phone] Subjective:   Robin Harrington, am serving as a scribe for Dr. Antoine Primas. This visit occurred during the SARS-CoV-2 public health emergency.  Safety protocols were in place, including screening questions prior to the visit, additional usage of staff PPE, and extensive cleaning of exam room while observing appropriate contact time as indicated for disinfecting solutions.  I'm seeing this patient by the request  of:  [name], FNP  CC: Left knee pain follow-up  [id number]   01/10/2021 Patient has had some progression since we have seen patient last.  Seems to be moderate on the left side at this point with patient having effusion noted.  Short course of meloxicam given.  Warned of potential side effects.  Patient given a Tru pull lite brace so she can continue to work out.  Encourage weight loss.  Follow-up again in 6 weeks.  Continue pain consider formal physical therapy or injection  Update 02/26/2021 Robin Harrington is a 55 y.o. female coming in with complaint of B knee pain. Has only been playing 2 days a week and use to play almost every day. Also not able to play at same intensity as she use to be able to. Very stiff in the morning. Wears brace with playing initially but hasn't needed it recently. Swelling seems to have decreased.   Xray L knee 01/10/2021 IMPRESSION: Very minimal degenerative change.  See above comment.  No acute osseous finding or effusion.     Past Medical History:  Diagnosis Date  . Acne   . Allergy   . Hyperlipidemia    No past surgical history on file. Social History   Socioeconomic History  . Marital status: Married    Spouse name: Not on file  . Number of children: Not on file  . Years of education: Not on file  . Highest education level: Not on file  Occupational History  . Not on file  Tobacco Use  . Smoking status: Never Smoker   . Smokeless tobacco: Never Used  Vaping Use  . Vaping Use: Never used  Substance and Sexual Activity  . Alcohol use: No  . Drug use: No  . Sexual activity: Not on file  Other Topics Concern  . Not on file  Social History Narrative  . Not on file   Social Determinants of Health   Financial Resource Strain: Not on file  Food Insecurity: Not on file  Transportation Needs: Not on file  Physical Activity: Not on file  Stress: Not on file  Social Connections: Not on file   No Known Allergies Family History  Problem Relation Age of Onset  . Hypertension Mother   . Atrial fibrillation Mother   . Cancer Mother        melanoma   . Cancer Father        colon   . Heart disease Father      Current Outpatient Medications (Cardiovascular):  .  rosuvastatin (CRESTOR) 40 MG tablet, Take 1 tablet (40 mg total) by mouth daily.  Current Outpatient Medications (Respiratory):  .  fluticasone (FLONASE) 50 MCG/ACT nasal spray, Place 2 sprays into both nostrils daily.  Current Outpatient Medications (Analgesics):  .  meloxicam (MOBIC) 7.5 MG tablet, Take 1 tablet (7.5 mg total) by mouth daily.   Current Outpatient Medications (Other):  .  co-enzyme Q-10 30 MG capsule, Take 1 capsule (30 mg total) by mouth daily. Marland Kitchen  cyclobenzaprine (FLEXERIL) 10 MG tablet, Take 1 tablet (10 mg total) by mouth at bedtime. Marland Kitchen  doxycycline (ORACEA) 40 MG capsule, Take 1 capsule (40 mg total) by mouth daily. Marland Kitchen  omeprazole (PRILOSEC) 20 MG capsule, Take 1 capsule (20 mg total) by mouth 2 (two) times daily before a meal. .  valACYclovir (VALTREX) 1000 MG tablet, Take 1 tablet (1,000 mg total) by mouth daily.   Reviewed prior external information including notes and imaging from  primary care provider As well as notes that were available from care everywhere and other healthcare systems.  Past medical history, social, surgical and family history all reviewed in electronic medical record.  No pertanent  information unless stated regarding to the chief complaint.   Review of Systems:  No headache, visual changes, nausea, vomiting, diarrhea, constipation, dizziness, abdominal pain, skin rash, fevers, chills, night sweats, weight loss, swollen lymph nodes, body aches, joint swelling, chest pain, shortness of breath, mood changes. POSITIVE muscle aches  Objective  Blood pressure 128/80, pulse 67, height 5\' 5"  (1.651 m), weight 194 lb (88 kg), SpO2 98 %.   General: No apparent distress alert and oriented x3 mood and affect normal, dressed appropriately.  HEENT: Pupils equal, extraocular movements intact  Respiratory: Patient's speak in full sentences and does not appear short of breath  Cardiovascular: No lower extremity edema, non tender, no erythema  Gait normal with good balance and coordination.  MSK: Left knee does have more tenderness to palpation over the patellofemoral joint.  Mild positive patellar grind test.  Mild positive McMurray's which is different than usual.  Patient does have near full range of motion with trace effusion noted of the left knee. Contralateral knee very mild grinding but not tender today  After informed written and verbal consent, patient was seated on exam table. Left knee was prepped with alcohol swab and utilizing anterolateral approach, patient's left knee space was injected with 4:1  marcaine 0.5%: Kenalog 40mg /dL. Patient tolerated the procedure well without immediate complications.    Impression and Recommendations:     The above documentation has been reviewed and is accurate and complete , DO

## 2021-02-26 ENCOUNTER — Other Ambulatory Visit: Payer: Self-pay

## 2021-02-26 ENCOUNTER — Ambulatory Visit: Payer: BC Managed Care – PPO | Admitting: Family Medicine

## 2021-02-26 ENCOUNTER — Encounter: Payer: Self-pay | Admitting: Family Medicine

## 2021-02-26 DIAGNOSIS — M1710 Unilateral primary osteoarthritis, unspecified knee: Secondary | ICD-10-CM

## 2021-02-26 NOTE — Assessment & Plan Note (Signed)
Patient given injection today and tolerated the procedure well.  Has some progression noted of the patellofemoral joint noted.  Patient is being a little noncompliant with the bracing.  Patient has continued to play tennis.  We discussed though if this does not seem to work I would encourage her to consider the advanced imaging but she declined today.  Patient is in agreement we will follow-up again in 6 weeks

## 2021-02-26 NOTE — Patient Instructions (Signed)
See me again in 6 weeks If not better will get MRI  

## 2021-04-03 ENCOUNTER — Encounter: Payer: Self-pay | Admitting: Family Medicine

## 2021-04-03 ENCOUNTER — Ambulatory Visit: Payer: BC Managed Care – PPO | Admitting: Family Medicine

## 2021-04-05 NOTE — Progress Notes (Signed)
[facility] [address] [place] [address] Phone: [phone] Subjective:   [name], am serving as a scribe for Dr. Antoine Primas. This visit occurred during the SARS-CoV-2 public health emergency.  Safety protocols were in place, including screening questions prior to the visit, additional usage of staff PPE, and extensive cleaning of exam room while observing appropriate contact time as indicated for disinfecting solutions.   I'm seeing this patient by the request  of:  [name], FNP  CC: Bilateral knee pain right greater than left  NAT:[id number]  02/26/2021 Patient given injection today and tolerated the procedure well.  Has some progression noted of the patellofemoral joint noted.  Patient is being a little noncompliant with the bracing.  Patient has continued to play tennis.  We discussed though if this does not seem to work I would encourage her to consider the advanced imaging but she declined today.  Patient is in agreement we will follow-up again in 6 weeks  Update 04/09/2021 TIFFANI KADOW is a 55 y.o. female coming in with complaint of L knee pain. Patient states that her pain has improved following the injection. Did play pickleball and had some sharp pain on 27th of May. Has intermittent swelling. Pain in R knee is now worse than left knee. Has achiness when she is active but is ok today.      Past Medical History:  Diagnosis Date   Acne    Allergy    Hyperlipidemia    No past surgical history on file. Social History   Socioeconomic History   Marital status: Married    Spouse name: Not on file   Number of children: Not on file   Years of education: Not on file   Highest education level: Not on file  Occupational History   Not on file  Tobacco Use   Smoking status: Never   Smokeless tobacco: Never  Vaping Use   Vaping Use: Never used  Substance and Sexual Activity   Alcohol use: No   Drug use: No    Sexual activity: Not on file  Other Topics Concern   Not on file  Social History Narrative   Not on file   Social Determinants of Health   Financial Resource Strain: Not on file  Food Insecurity: Not on file  Transportation Needs: Not on file  Physical Activity: Not on file  Stress: Not on file  Social Connections: Not on file   No Known Allergies Family History  Problem Relation Age of Onset   Hypertension Mother    Atrial fibrillation Mother    Cancer Mother        melanoma    Cancer Father        colon    Heart disease Father      Current Outpatient Medications (Cardiovascular):    rosuvastatin (CRESTOR) 40 MG tablet, Take 1 tablet (40 mg total) by mouth daily.  Current Outpatient Medications (Respiratory):    fluticasone (FLONASE) 50 MCG/ACT nasal spray, Place 2 sprays into both nostrils daily.  Current Outpatient Medications (Analgesics):    meloxicam (MOBIC) 7.5 MG tablet, Take 1 tablet (7.5 mg total) by mouth daily.   Current Outpatient Medications (Other):    co-enzyme Q-10 30 MG capsule, Take 1 capsule (30 mg total) by mouth daily.   cyclobenzaprine (FLEXERIL) 10 MG tablet, Take 1 tablet (10 mg total) by mouth at bedtime.   doxycycline (ORACEA) 40 MG capsule, Take 1 capsule (  40 mg total) by mouth daily.   omeprazole (PRILOSEC) 20 MG capsule, Take 1 capsule (20 mg total) by mouth 2 (two) times daily before a meal.   valACYclovir (VALTREX) 1000 MG tablet, Take 1 tablet (1,000 mg total) by mouth daily.   Reviewed prior external information including notes and imaging from  primary care provider As well as notes that were available from care everywhere and other healthcare systems.  Past medical history, social, surgical and family history all reviewed in electronic medical record.  No pertanent information unless stated regarding to the chief complaint.   Review of Systems:  No headache, visual changes, nausea, vomiting, diarrhea, constipation, dizziness,  abdominal pain, skin rash, fevers, chills, night sweats, weight loss, swollen lymph nodes, body aches, joint swelling, chest pain, shortness of breath, mood changes. POSITIVE muscle aches  Objective  Blood pressure 128/82, pulse 68, height 5\' 5"  (1.651 m), weight 192 lb (87.1 kg), SpO2 98 %.   General: No apparent distress alert and oriented x3 mood and affect normal, dressed appropriately.  HEENT: Pupils equal, extraocular movements intact  Respiratory: Patient's speak in full sentences and does not appear short of breath  Cardiovascular: No lower extremity edema, non tender, no erythema  Gait normal with good balance and coordination.  MSK: Bilateral knee exam show the patient still has some crepitus noted with patellofemoral.  Patient does have lateral tracking of the patella bilaterally.  No significant instability noted.  Patient does have tenderness to palpation over the medial joint line right greater than left.  Limited musculoskeletal ultrasound was performed and interpreted by  Limited ultrasound of patient's right knee shows that patient has had a trace effusion noted.  Mild narrowing of the patellofemoral joint but is fairly severe.  Mild narrowing of the medial joint space. 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   After informed written and verbal consent, patient was seated on exam table. Right knee was prepped with alcohol swab and utilizing anterolateral approach, patient's right knee space was injected with 4:1  marcaine 0.5%: Kenalog 40mg /dL. Patient tolerated the procedure well without immediate complications.    Impression and Recommendations:     The above documentation has been reviewed and is accurate and complete Judi Saa, DO

## 2021-04-09 ENCOUNTER — Other Ambulatory Visit: Payer: Self-pay

## 2021-04-09 ENCOUNTER — Ambulatory Visit (INDEPENDENT_AMBULATORY_CARE_PROVIDER_SITE_OTHER): Payer: BC Managed Care – PPO

## 2021-04-09 ENCOUNTER — Ambulatory Visit: Payer: Self-pay

## 2021-04-09 ENCOUNTER — Encounter: Payer: Self-pay | Admitting: Family Medicine

## 2021-04-09 ENCOUNTER — Ambulatory Visit: Payer: BC Managed Care – PPO | Admitting: Family Medicine

## 2021-04-09 VITALS — BP 128/82 | HR 68 | Ht 65.0 in | Wt 192.0 lb

## 2021-04-09 DIAGNOSIS — M25561 Pain in right knee: Secondary | ICD-10-CM

## 2021-04-09 DIAGNOSIS — G8929 Other chronic pain: Secondary | ICD-10-CM

## 2021-04-09 DIAGNOSIS — M1710 Unilateral primary osteoarthritis, unspecified knee: Secondary | ICD-10-CM

## 2021-04-09 NOTE — Assessment & Plan Note (Signed)
Bilateral.  Patient given another injection this time in the right knee.  Responded well.  Patient could be a candidate for viscosupplementation.  We will see if we can get approval for bilateral injections.  Patient will continue all other conservative therapy and icing regimen.  Did respond fairly well to the steroid injection on the left side previously.

## 2021-04-09 NOTE — Patient Instructions (Addendum)
Good to see you Xray today We will get you approved for gel See me again in 6-8 weeks

## 2021-04-15 ENCOUNTER — Ambulatory Visit (INDEPENDENT_AMBULATORY_CARE_PROVIDER_SITE_OTHER): Payer: BC Managed Care – PPO | Admitting: Family Medicine

## 2021-04-15 ENCOUNTER — Other Ambulatory Visit: Payer: Self-pay | Admitting: Family Medicine

## 2021-04-15 ENCOUNTER — Encounter: Payer: Self-pay | Admitting: Family Medicine

## 2021-04-15 DIAGNOSIS — W57XXXA Bitten or stung by nonvenomous insect and other nonvenomous arthropods, initial encounter: Secondary | ICD-10-CM

## 2021-04-15 DIAGNOSIS — R197 Diarrhea, unspecified: Secondary | ICD-10-CM | POA: Diagnosis not present

## 2021-04-15 DIAGNOSIS — R110 Nausea: Secondary | ICD-10-CM

## 2021-04-15 DIAGNOSIS — R519 Headache, unspecified: Secondary | ICD-10-CM

## 2021-04-15 DIAGNOSIS — S20461A Insect bite (nonvenomous) of right back wall of thorax, initial encounter: Secondary | ICD-10-CM | POA: Diagnosis not present

## 2021-04-15 DIAGNOSIS — J302 Other seasonal allergic rhinitis: Secondary | ICD-10-CM

## 2021-04-15 DIAGNOSIS — K219 Gastro-esophageal reflux disease without esophagitis: Secondary | ICD-10-CM

## 2021-04-15 MED ORDER — ONDANSETRON 8 MG PO TBDP: 8.0000 mg | ORAL_TABLET | Freq: Three times a day (TID) | ORAL | 0 refills | Status: DC | PRN

## 2021-04-15 NOTE — Progress Notes (Signed)
Virtual Visit  Note Due to COVID-19 pandemic this visit was conducted virtually. This visit type was conducted due to national recommendations for restrictions regarding the COVID-19 Pandemic (e.g. social distancing, sheltering in place) in an effort to limit this patient's exposure and mitigate transmission in our community. All issues noted in this document were discussed and addressed.  A physical exam was not performed with this format.  I connected with Robin [facility]rington on 04/15/21 at 1514 by telephone and verified that I am speaking with the correct person using two identifiers. Robin Harrington is currently located at home and no one is currently with her during the visit. The provider, [name], FNP is located in their office at time of visit.  I discussed the limitations, risks, security and privacy concerns of performing an evaluation and management service by telephone and the availability of in person appointments. I also discussed with the patient that there may be a patient responsible charge related to this service. The patient expressed understanding and agreed to proceed.  CC: HA  History and Present Illness:  HPI Maddilynn reports a HA, neck pain, chills, diarrhea, nausea and abdominal pain x3 days. She denies congestion, cough, body aches, myalgias, arthralgias, weakness, or vomiting. She report her highest temperature has been 99.5. She is able to move her neck. She has been able to keep down small bites of crackers. She is concerned because she had a tick bite on her mid right back about 3 weeks ago. The area was red and itchy. Her daughter noticed that the head of the tick had not been fully removed 4 days ago and finished digging out the tick. She reports that the redness and itchy has resolved since there. There has not been any drainage. She denies a rash. She is currently taking doxycycline 40 mg daily for acne.     ROS As per HPI.   Observations/Objective: Alert  and oriented x 3. Able to speak in full sentences without difficulty.   Assessment and Plan: [name] was seen today for headache.  Diagnoses and all orders for this visit:  Tick bite of right back wall of thorax, initial encounter 3 weeks ago. Report headache, neck pain, diarrhea, nausea, and chills x 3 days. Labs pending as below. On doxycyline 40 mg daily for acne. Will notify patient of results, and treat accordingly if positive.  -     Lyme Disease Serology w/Reflex; Future -     Rocky mtn spotted fvr abs pnl(IgG+IgM); Future -     Rocky mtn spotted fvr abs pnl(IgG+IgM) -     Lyme Disease Serology w/Reflex  Acute nonintractable headache, unspecified headache type Tylenol, advil as needed for headache.  -     Lyme Disease Serology w/Reflex; Future -     Rocky mtn spotted fvr abs pnl(IgG+IgM); Future -     Rocky mtn spotted fvr abs pnl(IgG+IgM) -     Lyme Disease Serology w/Reflex  Nausea Discussed that symptoms could be viral in nature. Zofran as needed for nausea. Bland diet, advance as tolerated. Stay well hydrated.  -     ondansetron (ZOFRAN ODT) 8 MG disintegrating tablet; Take 1 tablet (8 mg total) by mouth every 8 (eight) hours as needed for nausea or vomiting.  Diarrhea, unspecified type Immodium OTC as needed for diarrhea. Bland diet, advance as tolerated. Stay well hydrated.    Follow Up Instructions: Keep scheduled appointment on 04/19/21, follow up sooner for new or worsening symptoms.  I discussed the assessment and treatment plan with the patient. The patient was provided an opportunity to ask questions and all were answered. The patient agreed with the plan and demonstrated an understanding of the instructions.   The patient was advised to call back or seek an in-person evaluation if the symptoms worsen or if the condition fails to improve as anticipated.  The above assessment and management plan was discussed with the patient. The patient verbalized understanding  of and has agreed to the management plan. Patient is aware to call the clinic if symptoms persist or worsen. Patient is aware when to return to the clinic for a follow-up visit. Patient educated on when it is appropriate to go to the emergency department.   Time call ended:  1525  I provided 11 minutes of  non face-to-face time during this encounter.    [name], FNP

## 2021-04-17 ENCOUNTER — Telehealth: Payer: Self-pay | Admitting: Family Medicine

## 2021-04-17 NOTE — Telephone Encounter (Signed)
Pt aware and she has a GI appt at 8:30 in the morning

## 2021-04-17 NOTE — Telephone Encounter (Signed)
She should be evaluated in person, either in our office, urgent care, or the ED.

## 2021-04-17 NOTE — Telephone Encounter (Signed)
She is aware her results are not back yet.Pt states the diarrhea is not any better-loose stools every 30 minutes-has lost 10 lbs, some nausea but not vomiting. She took immodium yesterday but not today since it didn't help her at all yesterday. She is having terrible stomach cramps and is getting dizzy spells. Please advise.

## 2021-04-18 LAB — ROCKY MTN SPOTTED FVR ABS PNL(IGG+IGM)
RMSF IgG: NEGATIVE
RMSF IgM: 0.44 index (ref 0.00–0.89)

## 2021-04-18 LAB — LYME DISEASE SEROLOGY W/REFLEX: Lyme Total Antibody EIA: NEGATIVE

## 2021-04-19 ENCOUNTER — Encounter: Payer: Self-pay | Admitting: Family Medicine

## 2021-04-19 ENCOUNTER — Other Ambulatory Visit: Payer: Self-pay

## 2021-04-19 ENCOUNTER — Ambulatory Visit: Payer: BC Managed Care – PPO | Admitting: Family Medicine

## 2021-04-19 VITALS — BP 105/59 | HR 69 | Ht 65.0 in | Wt 185.4 lb

## 2021-04-19 DIAGNOSIS — R197 Diarrhea, unspecified: Secondary | ICD-10-CM

## 2021-04-19 DIAGNOSIS — E782 Mixed hyperlipidemia: Secondary | ICD-10-CM

## 2021-04-19 DIAGNOSIS — K219 Gastro-esophageal reflux disease without esophagitis: Secondary | ICD-10-CM | POA: Diagnosis not present

## 2021-04-19 DIAGNOSIS — Z6830 Body mass index (BMI) 30.0-30.9, adult: Secondary | ICD-10-CM | POA: Diagnosis not present

## 2021-04-19 NOTE — Progress Notes (Signed)
Established Patient Office Visit  Subjective:  Patient ID: [facility], female    DOB: 1966-09-20  Age: 55 y.o. MRN: [id number]  CC:  Chief Complaint  Patient presents with   Medical Management of Chronic Issues   Hyperlipidemia    HPI Robin [facility] presents for chronic follow up.   She has been dealing with diarrhea lately. She is still having diarrhea today but she feels much better. She is able to eat a little and is staying well hydrated. She is not feeling dizzy. In the last 24 hours she has had 10 episodes of diarrhea, this is much better than around 30 episodes daily before this. She was seen by GI this morning. She was given a antibiotic and probiotic. Stool cultures are pending.   She started on Crestor at her last visit. She reports doing well without side effects. She has been exercising regularly regularly prior to her current GI illness. She has been trying to eat a well balanced diet. She would like to talk about medications for weight management. She has failed Phentermine and Qsymia in the past.   Her acid reflux has been worse lately because she had to stop taking this for a few days due to the diarrhea. She is not having trouble swallowing.    Past Medical History:  Diagnosis Date   Acne    Allergy    Hyperlipidemia     No past surgical history on file.  Family History  Problem Relation Age of Onset   Hypertension Mother    Atrial fibrillation Mother    Cancer Mother        melanoma    Cancer Father        colon    Heart disease Father     Social History   Socioeconomic History   Marital status: Married    Spouse name: Not on file   Number of children: Not on file   Years of education: Not on file   Highest education level: Not on file  Occupational History   Not on file  Tobacco Use   Smoking status: Never   Smokeless tobacco: Never  Vaping Use   Vaping Use: Never used  Substance and Sexual Activity   Alcohol use: No   Drug use:  No   Sexual activity: Not on file  Other Topics Concern   Not on file  Social History Narrative   Not on file   Social Determinants of Health   Financial Resource Strain: Not on file  Food Insecurity: Not on file  Transportation Needs: Not on file  Physical Activity: Not on file  Stress: Not on file  Social Connections: Not on file  Intimate Partner Violence: Not on file    Outpatient Medications Prior to Visit  Medication Sig Dispense Refill   cyclobenzaprine (FLEXERIL) 10 MG tablet Take 1 tablet (10 mg total) by mouth at bedtime. 90 tablet 2   doxycycline (ORACEA) 40 MG capsule Take 1 capsule (40 mg total) by mouth daily. 30 capsule 6   fluticasone (FLONASE) 50 MCG/ACT nasal spray Place 2 sprays into both nostrils daily. 16 g 1   omeprazole (PRILOSEC) 20 MG capsule Take 1 capsule (20 mg total) by mouth 2 (two) times daily before a meal. 90 capsule 0   ondansetron (ZOFRAN ODT) 8 MG disintegrating tablet Take 1 tablet (8 mg total) by mouth every 8 (eight) hours as needed for nausea or vomiting. 20 tablet 0   rosuvastatin (CRESTOR)  40 MG tablet Take 1 tablet (40 mg total) by mouth daily. 90 tablet 3   valACYclovir (VALTREX) 1000 MG tablet Take 1 tablet (1,000 mg total) by mouth daily. 30 tablet 11   co-enzyme Q-10 30 MG capsule Take 1 capsule (30 mg total) by mouth daily. 90 capsule 3   meloxicam (MOBIC) 7.5 MG tablet Take 1 tablet (7.5 mg total) by mouth daily. 30 tablet 0   No facility-administered medications prior to visit.    No Known Allergies  ROS Review of Systems As per HPI.    Objective:    Physical Exam Vitals and nursing note reviewed.  Constitutional:      General: She is not in acute distress.    Appearance: She is not ill-appearing, toxic-appearing or diaphoretic.  Cardiovascular:     Rate and Rhythm: Normal rate and regular rhythm.     Heart sounds: Normal heart sounds. No murmur heard. Pulmonary:     Effort: Pulmonary effort is normal. No respiratory  distress.     Breath sounds: Normal breath sounds.  Musculoskeletal:     Right lower leg: No edema.     Left lower leg: No edema.  Skin:    General: Skin is warm and dry.  Neurological:     General: No focal deficit present.     Mental Status: She is alert and oriented to person, place, and time.  Psychiatric:        Mood and Affect: Mood normal.        Behavior: Behavior normal.    BP (!) 105/59   Pulse 69   Ht $R'5\' 5"'rV$  (1.651 m)   Wt 185 lb 6 oz (84.1 kg)   BMI 30.85 kg/m  Wt Readings from Last 3 Encounters:  04/19/21 185 lb 6 oz (84.1 kg)  04/09/21 192 lb (87.1 kg)  02/26/21 194 lb (88 kg)     Health Maintenance Due  Topic Date Due   MAMMOGRAM  12/25/2015   Zoster Vaccines- Shingrix (1 of 2) Never done   COVID-19 Vaccine (3 - Booster for Pfizer series) 07/01/2020    There are no preventive care reminders to display for this patient.  Lab Results  Component Value Date   TSH 3.370 12/16/2019   Lab Results  Component Value Date   WBC 3.6 01/10/2021   HGB 13.0 01/10/2021   HCT 40.0 01/10/2021   MCV 81 01/10/2021   PLT 312 01/10/2021   Lab Results  Component Value Date   NA 141 01/10/2021   K 4.4 01/10/2021   CO2 23 01/10/2021   GLUCOSE 99 01/10/2021   BUN 15 01/10/2021   CREATININE 0.83 01/10/2021   BILITOT 0.3 01/10/2021   ALKPHOS 59 01/10/2021   AST 17 01/10/2021   ALT 13 01/10/2021   PROT 6.8 01/10/2021   ALBUMIN 4.6 01/10/2021   CALCIUM 9.7 01/10/2021   EGFR 83 01/10/2021   Lab Results  Component Value Date   CHOL 309 (H) 01/10/2021   Lab Results  Component Value Date   HDL 72 01/10/2021   Lab Results  Component Value Date   LDLCALC 215 (H) 01/10/2021   Lab Results  Component Value Date   TRIG 125 01/10/2021   Lab Results  Component Value Date   CHOLHDL 4.3 01/10/2021   No results found for: HGBA1C    Assessment & Plan:   [name] was seen today for medical management of chronic issues and hyperlipidemia.  Diagnoses and all  orders for this visit:  Body mass index 30.0-30.9, adult Continue diet and exercise. Discussed trying Saxenda for weight management when GI symptoms resolve. She will let me know when she is ready to try this. Will return for future labs.  -     CBC with Differential/Platelet; Future -     CMP14+EGFR; Future -     TSH; Future  Mixed hyperlipidemia On Crestor. Will return for labs.  -     CMP14+EGFR; Future -     Lipid panel; Future  Gastroesophageal reflux disease without esophagitis Holding PPI during current GI illness, will restart in a few days. Previously well controlled with PPI.  -     CBC with Differential/Platelet; Future -     CMP14+EGFR; Future  Diarrhea, unspecified type Seen by GI. Started on antibiotic and probiotic. Improving.   Follow-up: Return in about 3 months (around 07/20/2021) for chronic follow up.   The patient indicates understanding of these issues and agrees with the plan.  Gwenlyn Perking, FNP

## 2021-04-19 NOTE — Patient Instructions (Signed)
High Cholesterol  High cholesterol is a condition in which the blood has high levels of a white, waxy substance similar to fat (cholesterol). The liver makes all the cholesterol that the body needs. The human body needs small amounts of cholesterol to help build cells. A person gets extra orexcess cholesterol from the food that he or she eats. The blood carries cholesterol from the liver to the rest of the body. If you have high cholesterol, deposits (plaques) may build up on the walls of your arteries. Arteries are the blood vessels that carry blood away from your heart. These plaques make the arteries narrowand stiff. Cholesterol plaques increase your risk for heart attack and stroke. Work withyour health care provider to keep your cholesterol levels in a healthy range. What increases the risk? The following factors may make you more likely to develop this condition: Eating foods that are high in animal fat (saturated fat) or cholesterol. Being overweight. Not getting enough exercise. A family history of high cholesterol (familial hypercholesterolemia). Use of tobacco products. Having diabetes. What are the signs or symptoms? There are no symptoms of this condition. How is this diagnosed? This condition may be diagnosed based on the results of a blood test. If you are older than 55 years of age, your health care provider may check your cholesterol levels every 4-6 years. You may be checked more often if you have high cholesterol or other risk factors for heart disease. The blood test for cholesterol measures: "Bad" cholesterol, or LDL cholesterol. This is the main type of cholesterol that causes heart disease. The desired level is less than 100 mg/dL. "Good" cholesterol, or HDL cholesterol. HDL helps protect against heart disease by cleaning the arteries and carrying the LDL to the liver for processing. The desired level for HDL is 60 mg/dL or higher. Triglycerides. These are fats that your  body can store or burn for energy. The desired level is less than 150 mg/dL. Total cholesterol. This measures the total amount of cholesterol in your blood and includes LDL, HDL, and triglycerides. The desired level is less than 200 mg/dL. How is this treated? This condition may be treated with: Diet changes. You may be asked to eat foods that have more fiber and less saturated fats or added sugar. Lifestyle changes. These may include regular exercise, maintaining a healthy weight, and quitting use of tobacco products. Medicines. These are given when diet and lifestyle changes have not worked. You may be prescribed a statin medicine to help lower your cholesterol levels. Follow these instructions at home: Eating and drinking  Eat a healthy, balanced diet. This diet includes: Daily servings of a variety of fresh, frozen, or canned fruits and vegetables. Daily servings of whole grain foods that are rich in fiber. Foods that are low in saturated fats and trans fats. These include poultry and fish without skin, lean cuts of meat, and low-fat dairy products. A variety of fish, especially oily fish that contain omega-3 fatty acids. Aim to eat fish at least 2 times a week. Avoid foods and drinks that have added sugar. Use healthy cooking methods, such as roasting, grilling, broiling, baking, poaching, steaming, and stir-frying. Do not fry your food except for stir-frying.  Lifestyle  Get regular exercise. Aim to exercise for a total of 150 minutes a week. Increase your activity level by doing activities such as gardening, walking, and taking the stairs. Do not use any products that contain nicotine or tobacco, such as cigarettes, e-cigarettes, and chewing tobacco.   If you need help quitting, ask your health care provider.  General instructions Take over-the-counter and prescription medicines only as told by your health care provider. Keep all follow-up visits as told by your health care provider.  This is important. Where to find more information American Heart Association: www.heart.org National Heart, Lung, and Blood Institute: www.nhlbi.nih.gov Contact a health care provider if: You have trouble achieving or maintaining a healthy diet or weight. You are starting an exercise program. You are unable to stop smoking. Get help right away if: You have chest pain. You have trouble breathing. You have any symptoms of a stroke. "BE FAST" is an easy way to remember the main warning signs of a stroke: B - Balance. Signs are dizziness, sudden trouble walking, or loss of balance. E - Eyes. Signs are trouble seeing or a sudden change in vision. F - Face. Signs are sudden weakness or numbness of the face, or the face or eyelid drooping on one side. A - Arms. Signs are weakness or numbness in an arm. This happens suddenly and usually on one side of the body. S - Speech. Signs are sudden trouble speaking, slurred speech, or trouble understanding what people say. T - Time. Time to call emergency services. Write down what time symptoms started. You have other signs of a stroke, such as: A sudden, severe headache with no known cause. Nausea or vomiting. Seizure. These symptoms may represent a serious problem that is an emergency. Do not wait to see if the symptoms will go away. Get medical help right away. Call your local emergency services (911 in the [place] Do not drive yourself to the hospital. Summary Cholesterol plaques increase your risk for heart attack and stroke. Work with your health care provider to keep your cholesterol levels in a healthy range. Eat a healthy, balanced diet, get regular exercise, and maintain a healthy weight. Do not use any products that contain nicotine or tobacco, such as cigarettes, e-cigarettes, and chewing tobacco. Get help right away if you have any symptoms of a stroke. This information is not intended to replace advice given to you by your health care  provider. Make sure you discuss any questions you have with your healthcare provider. Document Revised: 09/05/2019 Document Reviewed: 09/05/2019 Elsevier Patient Education  2022 Elsevier Inc.  

## 2021-04-27 ENCOUNTER — Encounter: Payer: Self-pay | Admitting: Family Medicine

## 2021-04-29 ENCOUNTER — Other Ambulatory Visit: Payer: Self-pay | Admitting: Family Medicine

## 2021-04-29 DIAGNOSIS — Z6830 Body mass index (BMI) 30.0-30.9, adult: Secondary | ICD-10-CM

## 2021-04-29 MED ORDER — SAXENDA 18 MG/3ML ~~LOC~~ SOPN: PEN_INJECTOR | SUBCUTANEOUS | 2 refills | Status: DC

## 2021-04-30 ENCOUNTER — Telehealth: Payer: Self-pay | Admitting: Family Medicine

## 2021-04-30 ENCOUNTER — Other Ambulatory Visit: Payer: Self-pay

## 2021-04-30 DIAGNOSIS — M1710 Unilateral primary osteoarthritis, unspecified knee: Secondary | ICD-10-CM

## 2021-04-30 NOTE — Telephone Encounter (Signed)
Sure can proceed with mri

## 2021-04-30 NOTE — Telephone Encounter (Signed)
Pt still struggling with both knees after injections, currently the left is worse than the right. Pt is interested in having bilateral knee MRI if Dr. Katrinka Blazing agrees.  She said Dr. Katrinka Blazing also mentioned gel injections, but pt very interested in the MRI first to be able to see what is going on. She has occasional swelling in her knees and calves.

## 2021-04-30 NOTE — Progress Notes (Unsigned)
MRI

## 2021-04-30 NOTE — Telephone Encounter (Signed)
MRI's ordered. Patient notified. 

## 2021-05-01 ENCOUNTER — Ambulatory Visit: Payer: BC Managed Care – PPO | Admitting: Family Medicine

## 2021-05-01 ENCOUNTER — Telehealth: Payer: Self-pay | Admitting: Family Medicine

## 2021-05-01 NOTE — Telephone Encounter (Signed)
PA in process for (Key: [facility]) Rx #: [id number] Saxenda 18MG pen-injectors

## 2021-05-03 ENCOUNTER — Ambulatory Visit
Admission: RE | Admit: 2021-05-03 | Discharge: 2021-05-03 | Disposition: A | Discharge status: Home / Self Care | Payer: BC Managed Care – PPO | Source: Ambulatory Visit | Attending: Family Medicine | Admitting: Family Medicine

## 2021-05-03 ENCOUNTER — Other Ambulatory Visit: Payer: Self-pay

## 2021-05-03 DIAGNOSIS — M1710 Unilateral primary osteoarthritis, unspecified knee: Secondary | ICD-10-CM

## 2021-05-09 NOTE — Telephone Encounter (Signed)
PA approved 05/01/21-09/01/2021. Pharmacy aware.

## 2021-05-20 NOTE — Progress Notes (Signed)
[name] [address] [place] [address] Phone: [phone] Subjective:   [name], am serving as a scribe for Dr. Antoine Primas.  This visit occurred during the SARS-CoV-2 public health emergency.  Safety protocols were in place, including screening questions prior to the visit, additional usage of staff PPE, and extensive cleaning of exam room while observing appropriate contact time as indicated for disinfecting solutions.   I'm seeing this patient by the request  of:  [name], FNP  CC: Bilateral knee pain  ION:[id number]  04/09/2021 Bilateral.  Patient given another injection this time in the right knee.  Responded well.  Patient could be a candidate for viscosupplementation.  We will see if we can get approval for bilateral injections.  Patient will continue all other conservative therapy and icing regimen.  Did respond fairly well to the steroid injection on the left side previously.  Update 05/21/2021 Robin Harrington is a 55 y.o. female coming in with complaint of B knee pain. Patient is here today for Synvisc One injection. Patient states that that her right knee is more swollen today. Played pickleball for 3 hours of Friday and then played with her granddaughter for a few hours. Has been icing and resting knee.       Past Medical History:  Diagnosis Date   Acne    Allergy    Hyperlipidemia    No past surgical history on file. Social History   Socioeconomic History   Marital status: Married    Spouse name: Not on file   Number of children: Not on file   Years of education: Not on file   Highest education level: Not on file  Occupational History   Not on file  Tobacco Use   Smoking status: Never   Smokeless tobacco: Never  Vaping Use   Vaping Use: Never used  Substance and Sexual Activity   Alcohol use: No   Drug use: No   Sexual activity: Not on file  Other Topics Concern   Not on file  Social History  Narrative   Not on file   Social Determinants of Health   Financial Resource Strain: Not on file  Food Insecurity: Not on file  Transportation Needs: Not on file  Physical Activity: Not on file  Stress: Not on file  Social Connections: Not on file   No Known Allergies Family History  Problem Relation Age of Onset   Hypertension Mother    Atrial fibrillation Mother    Cancer Mother        melanoma    Cancer Father        colon    Heart disease Father      Current Outpatient Medications (Cardiovascular):    rosuvastatin (CRESTOR) 40 MG tablet, Take 1 tablet (40 mg total) by mouth daily.  Current Outpatient Medications (Respiratory):    fluticasone (FLONASE) 50 MCG/ACT nasal spray, Place 2 sprays into both nostrils daily.    Current Outpatient Medications (Other):    cyclobenzaprine (FLEXERIL) 10 MG tablet, Take 1 tablet (10 mg total) by mouth at bedtime.   doxycycline (ORACEA) 40 MG capsule, Take 1 capsule (40 mg total) by mouth daily.   Liraglutide -Weight Management (SAXENDA) 18 MG/3ML SOPN, Inject 0.6 mg into the skin daily for days 1-7, then increase to 1.2 mg into the skin daily for days 8-14, then increase to 1.8 mg daily for days 15-21, then increase to 2.4 mg daily for days  22-28, then increase to 3.0 mg daily for days 29-35.   omeprazole (PRILOSEC) 20 MG capsule, Take 1 capsule (20 mg total) by mouth 2 (two) times daily before a meal.   ondansetron (ZOFRAN ODT) 8 MG disintegrating tablet, Take 1 tablet (8 mg total) by mouth every 8 (eight) hours as needed for nausea or vomiting.   valACYclovir (VALTREX) 1000 MG tablet, Take 1 tablet (1,000 mg total) by mouth daily.   Reviewed prior external information including notes and imaging from  primary care provider As well as notes that were available from care everywhere and other healthcare systems.  Past medical history, social, surgical and family history all reviewed in electronic medical record.  No pertanent  information unless stated regarding to the chief complaint.   Review of Systems:  No headache, visual changes, nausea, vomiting, diarrhea, constipation, dizziness, abdominal pain, skin rash, fevers, chills, night sweats, weight loss, swollen lymph nodes, body aches, joint swelling, chest pain, shortness of breath, mood changes. POSITIVE muscle aches  Objective  Blood pressure 102/68, pulse 85, height 5\' 5"  (1.651 m), weight 185 lb (83.9 kg), SpO2 99 %.   General: No apparent distress alert and oriented x3 mood and affect normal, dressed appropriately.  HEENT: Pupils equal, extraocular movements intact  Respiratory: Patient's speak in full sentences and does not appear short of breath  Cardiovascular: No lower extremity edema, non tender, no erythema  Gait normal with good balance and coordination.  MSK:   Knee exams bilaterally show the patient does have arthritic changes.  Some crepitus noted mostly of the patellofemoral joint.  Lateral tracking noted.  Positive patellar grind test.  Tender over the medial joint line as well.  After informed written and verbal consent, patient was seated on exam table. Right knee was prepped with alcohol swab and utilizing anterolateral approach, patient's right knee space was injected with 48 mg/2.5 mL of Synvisc (sodium hyaluronate) in a prefilled syringe was injected easily into the knee through a 22-gauge needle.. Patient tolerated the procedure well without immediate complications.  After informed written and verbal consent, patient was seated on exam table. Left knee was prepped with alcohol swab and utilizing anterolateral approach, patient's left knee space was injected with 48 mg/2.5 mL of Synvisc (sodium hyaluronate) in a prefilled syringe was injected easily into the knee through a 22-gauge needle.. Patient tolerated the procedure well without immediate complications.    Impression and Recommendations:    The above documentation has been reviewed  and is accurate and complete , DO

## 2021-05-21 ENCOUNTER — Encounter: Payer: Self-pay | Admitting: Family Medicine

## 2021-05-21 ENCOUNTER — Ambulatory Visit: Payer: BC Managed Care – PPO | Admitting: Family Medicine

## 2021-05-21 ENCOUNTER — Other Ambulatory Visit: Payer: Self-pay

## 2021-05-21 DIAGNOSIS — M1710 Unilateral primary osteoarthritis, unspecified knee: Secondary | ICD-10-CM | POA: Diagnosis not present

## 2021-05-21 NOTE — Assessment & Plan Note (Signed)
Viscosupplementation given today.  Tolerated the procedure well.  We discussed icing regimen and home exercises.  Discussed increasing activity appropriately.  Discussed that if there is any redness or swelling that patient should seek medical attention.  Patient will otherwise follow-up with me again in 6 weeks

## 2021-05-21 NOTE — Patient Instructions (Signed)
See me again in 5-6 weeks Take it easy for a couple of days Ice is your friend!

## 2021-06-18 ENCOUNTER — Encounter: Payer: Self-pay | Admitting: Family Medicine

## 2021-06-18 ENCOUNTER — Ambulatory Visit: Payer: BC Managed Care – PPO | Admitting: Family Medicine

## 2021-06-18 ENCOUNTER — Other Ambulatory Visit: Payer: Self-pay

## 2021-06-18 VITALS — BP 101/60 | HR 56 | Temp 98.4°F | Ht 65.0 in | Wt 179.4 lb

## 2021-06-18 DIAGNOSIS — G8929 Other chronic pain: Secondary | ICD-10-CM

## 2021-06-18 DIAGNOSIS — M25562 Pain in left knee: Secondary | ICD-10-CM

## 2021-06-18 DIAGNOSIS — E782 Mixed hyperlipidemia: Secondary | ICD-10-CM | POA: Diagnosis not present

## 2021-06-18 DIAGNOSIS — E663 Overweight: Secondary | ICD-10-CM

## 2021-06-18 DIAGNOSIS — Z6830 Body mass index (BMI) 30.0-30.9, adult: Secondary | ICD-10-CM

## 2021-06-18 DIAGNOSIS — F5109 Other insomnia not due to a substance or known physiological condition: Secondary | ICD-10-CM | POA: Diagnosis not present

## 2021-06-18 DIAGNOSIS — K219 Gastro-esophageal reflux disease without esophagitis: Secondary | ICD-10-CM

## 2021-06-18 DIAGNOSIS — M25561 Pain in right knee: Secondary | ICD-10-CM | POA: Diagnosis not present

## 2021-06-18 MED ORDER — MELOXICAM 7.5 MG PO TABS: 7.5000 mg | ORAL_TABLET | Freq: Every day | ORAL | 1 refills | Status: DC

## 2021-06-18 MED ORDER — TRAZODONE HCL 50 MG PO TABS: 25.0000 mg | ORAL_TABLET | Freq: Every evening | ORAL | 3 refills | Status: DC | PRN

## 2021-06-18 NOTE — Progress Notes (Signed)
Established Patient Office Visit  Subjective:  Patient ID: Robin Harrington, female    DOB: [birth year]  Age: 55 y.o. MRN: [id number]  CC:  Chief Complaint  Patient presents with   Obesity    HPI Robin Harrington presents for weight management. She started on saxenda about 1 month ago. She is now on the 3 mg dosage. She reports doing well on this. She is continuing to play pickleball and eat a balanced diet. She has been following with ortho for knee pain, this has held back her ability to exercise. She has received gel injections without much improvement. She has tried ibuprofen with little improvement.   She would like sometime to take prn for insomnia. She reports that she mainly has difficulty sleepy when on vacation or outside of her normal sleeping habits. She has tried melantonin without success. She has tried [place] in the past with good results.   She did have a protein shake about 6 hours ago. She would like to have her labs repeated today to check her kidney function.   Past Medical History:  Diagnosis Date   Acne    Allergy    Hyperlipidemia     No past surgical history on file.  Family History  Problem Relation Age of Onset   Hypertension Mother    Atrial fibrillation Mother    Cancer Mother        melanoma    Cancer Father        colon    Heart disease Father     Social History   Socioeconomic History   Marital status: Married    Spouse name: Not on file   Number of children: Not on file   Years of education: Not on file   Highest education level: Not on file  Occupational History   Not on file  Tobacco Use   Smoking status: Never   Smokeless tobacco: Never  Vaping Use   Vaping Use: Never used  Substance and Sexual Activity   Alcohol use: No   Drug use: No   Sexual activity: Not on file  Other Topics Concern   Not on file  Social History Narrative   Not on file   Social Determinants of Health   Financial Resource Strain: Not on file  Food  Insecurity: Not on file  Transportation Needs: Not on file  Physical Activity: Not on file  Stress: Not on file  Social Connections: Not on file  Intimate Partner Violence: Not on file    Outpatient Medications Prior to Visit  Medication Sig Dispense Refill   cyclobenzaprine (FLEXERIL) 10 MG tablet Take 1 tablet (10 mg total) by mouth at bedtime. 90 tablet 2   doxycycline (ORACEA) 40 MG capsule Take 1 capsule (40 mg total) by mouth daily. 30 capsule 6   fluticasone (FLONASE) 50 MCG/ACT nasal spray Place 2 sprays into both nostrils daily. 16 g 1   Liraglutide -Weight Management (SAXENDA) 18 MG/3ML SOPN Inject 0.6 mg into the skin daily for days 1-7, then increase to 1.2 mg into the skin daily for days 8-14, then increase to 1.8 mg daily for days 15-21, then increase to 2.4 mg daily for days 22-28, then increase to 3.0 mg daily for days 29-35. 3 mL 2   omeprazole (PRILOSEC) 20 MG capsule Take 1 capsule (20 mg total) by mouth 2 (two) times daily before a meal. 90 capsule 0   rosuvastatin (CRESTOR) 40 MG tablet Take 1 tablet (40 mg  total) by mouth daily. 90 tablet 3   valACYclovir (VALTREX) 1000 MG tablet Take 1 tablet (1,000 mg total) by mouth daily. 30 tablet 11   ondansetron (ZOFRAN ODT) 8 MG disintegrating tablet Take 1 tablet (8 mg total) by mouth every 8 (eight) hours as needed for nausea or vomiting. 20 tablet 0   No facility-administered medications prior to visit.    No Known Allergies  ROS Review of Systems As per HPI.   Objective:    Physical Exam Vitals and nursing note reviewed.  Constitutional:      General: She is not in acute distress.    Appearance: She is not ill-appearing, toxic-appearing or diaphoretic.  Cardiovascular:     Rate and Rhythm: Normal rate and regular rhythm.     Heart sounds: Normal heart sounds. No murmur heard. Pulmonary:     Effort: Pulmonary effort is normal.     Breath sounds: Normal breath sounds.  Abdominal:     General: Bowel sounds are  normal. There is no distension.     Palpations: Abdomen is soft.     Tenderness: There is no abdominal tenderness. There is no guarding or rebound.  Musculoskeletal:     Right lower leg: No edema.     Left lower leg: No edema.  Skin:    General: Skin is warm and dry.  Neurological:     General: No focal deficit present.     Mental Status: She is alert and oriented to person, place, and time.  Psychiatric:        Mood and Affect: Mood normal.        Behavior: Behavior normal.    BP 101/60   Pulse (!) 56   Temp 98.4 F (36.9 C) (Temporal)   Ht $R'5\' 5"'XI$  (1.651 m)   Wt 179 lb 6 oz (81.4 kg)   BMI 29.85 kg/m  Wt Readings from Last 3 Encounters:  06/18/21 179 lb 6 oz (81.4 kg)  05/21/21 185 lb (83.9 kg)  04/19/21 185 lb 6 oz (84.1 kg)     Health Maintenance Due  Topic Date Due   MAMMOGRAM  12/25/2015   Zoster Vaccines- Shingrix (1 of 2) Never done   COVID-19 Vaccine (3 - Booster for Pfizer series) 07/01/2020    There are no preventive care reminders to display for this patient.  Lab Results  Component Value Date   TSH 3.370 12/16/2019   Lab Results  Component Value Date   WBC 3.6 01/10/2021   HGB 13.0 01/10/2021   HCT 40.0 01/10/2021   MCV 81 01/10/2021   PLT 312 01/10/2021   Lab Results  Component Value Date   NA 141 01/10/2021   K 4.4 01/10/2021   CO2 23 01/10/2021   GLUCOSE 99 01/10/2021   BUN 15 01/10/2021   CREATININE 0.83 01/10/2021   BILITOT 0.3 01/10/2021   ALKPHOS 59 01/10/2021   AST 17 01/10/2021   ALT 13 01/10/2021   PROT 6.8 01/10/2021   ALBUMIN 4.6 01/10/2021   CALCIUM 9.7 01/10/2021   EGFR 83 01/10/2021   Lab Results  Component Value Date   CHOL 309 (H) 01/10/2021   Lab Results  Component Value Date   HDL 72 01/10/2021   Lab Results  Component Value Date   LDLCALC 215 (H) 01/10/2021   Lab Results  Component Value Date   TRIG 125 01/10/2021   Lab Results  Component Value Date   CHOLHDL 4.3 01/10/2021   No results found for:  HGBA1C  Assessment & Plan:   Robin Harrington was seen today for obesity.  Diagnoses and all orders for this visit:  Overweight (BMI 25.0-29.9) Doing well on saxena. Has lost 6 pounds. Continue diet and exercise along with saxenda.  -     TSH -     CMP14+EGFR -     CBC with Differential/Platelet  Mixed hyperlipidemia On Crestor. Labs pending.  -     Lipid panel -     CMP14+EGFR  Chronic pain of both knees Seeing ortho. Meloxicam prn.  -     meloxicam (MOBIC) 7.5 MG tablet; Take 1-2 tablets (7.5-15 mg total) by mouth daily.  Situational insomnia Try trazodone as below.  -     traZODone (DESYREL) 50 MG tablet; Take 0.5-1 tablets (25-50 mg total) by mouth at bedtime as needed for sleep.  Gastroesophageal reflux disease without esophagitis Well controlled on current regimen.  -     CMP14+EGFR -     CBC with Differential/Platelet   Follow-up: Return in about 6 weeks (around 07/30/2021) for weight management.   The patient indicates understanding of these issues and agrees with the plan.  Gwenlyn Perking, FNP

## 2021-06-18 NOTE — Patient Instructions (Signed)
Insomnia Insomnia is a sleep disorder that makes it difficult to fall asleep or stay asleep. Insomnia can cause fatigue, low energy, difficulty concentrating, moodswings, and poor performance at work or school. There are three different ways to classify insomnia: Difficulty falling asleep. Difficulty staying asleep. Waking up too early in the morning. Any type of insomnia can be long-term (chronic) or short-term (acute). Both are common. Short-term insomnia usually lasts for three months or less. Chronic insomnia occurs at least three times a week for longer than threemonths. What are the causes? Insomnia may be caused by another condition, situation, or substance, such as: Anxiety. Certain medicines. Gastroesophageal reflux disease (GERD) or other gastrointestinal conditions. Asthma or other breathing conditions. Restless legs syndrome, sleep apnea, or other sleep disorders. Chronic pain. Menopause. Stroke. Abuse of alcohol, tobacco, or illegal drugs. Mental health conditions, such as depression. Caffeine. Neurological disorders, such as Alzheimer's disease. An overactive thyroid (hyperthyroidism). Sometimes, the cause of insomnia may not be known. What increases the risk? Risk factors for insomnia include: Gender. Women are affected more often than men. Age. Insomnia is more common as you get older. Stress. Lack of exercise. Irregular work schedule or working night shifts. Traveling between different time zones. Certain medical and mental health conditions. What are the signs or symptoms? If you have insomnia, the main symptom is having trouble falling asleep or having trouble staying asleep. This may lead to other symptoms, such as: Feeling fatigued or having low energy. Feeling nervous about going to sleep. Not feeling rested in the morning. Having trouble concentrating. Feeling irritable, anxious, or depressed. How is this diagnosed? This condition may be diagnosed based  on: Your symptoms and medical history. Your health care provider may ask about: Your sleep habits. Any medical conditions you have. Your mental health. A physical exam. How is this treated? Treatment for insomnia depends on the cause. Treatment may focus on treating an underlying condition that is causing insomnia. Treatment may also include: Medicines to help you sleep. Counseling or therapy. Lifestyle adjustments to help you sleep better. Follow these instructions at home: Eating and drinking  Limit or avoid alcohol, caffeinated beverages, and cigarettes, especially close to bedtime. These can disrupt your sleep. Do not eat a large meal or eat spicy foods right before bedtime. This can lead to digestive discomfort that can make it hard for you to sleep.  Sleep habits  Keep a sleep diary to help you and your health care provider figure out what could be causing your insomnia. Write down: When you sleep. When you wake up during the night. How well you sleep. How rested you feel the next day. Any side effects of medicines you are taking. What you eat and drink. Make your bedroom a dark, comfortable place where it is easy to fall asleep. Put up shades or blackout curtains to block light from outside. Use a white noise machine to block noise. Keep the temperature cool. Limit screen use before bedtime. This includes: Watching TV. Using your smartphone, tablet, or computer. Stick to a routine that includes going to bed and waking up at the same times every day and night. This can help you fall asleep faster. Consider making a quiet activity, such as reading, part of your nighttime routine. Try to avoid taking naps during the day so that you sleep better at night. Get out of bed if you are still awake after 15 minutes of trying to sleep. Keep the lights down, but try reading or doing a quiet   activity. When you feel sleepy, go back to bed.  General instructions Take over-the-counter  and prescription medicines only as told by your health care provider. Exercise regularly, as told by your health care provider. Avoid exercise starting several hours before bedtime. Use relaxation techniques to manage stress. Ask your health care provider to suggest some techniques that may work well for you. These may include: Breathing exercises. Routines to release muscle tension. Visualizing peaceful scenes. Make sure that you drive carefully. Avoid driving if you feel very sleepy. Keep all follow-up visits as told by your health care provider. This is important. Contact a health care provider if: You are tired throughout the day. You have trouble in your daily routine due to sleepiness. You continue to have sleep problems, or your sleep problems get worse. Get help right away if: You have serious thoughts about hurting yourself or someone else. If you ever feel like you may hurt yourself or others, or have thoughts about taking your own life, get help right away. You can go to your nearest emergency department or call: Your local emergency services (911 in the [place] A suicide crisis helpline, such as the National Suicide Prevention Lifeline at 1-800-273-8255. This is open 24 hours a day. Summary Insomnia is a sleep disorder that makes it difficult to fall asleep or stay asleep. Insomnia can be long-term (chronic) or short-term (acute). Treatment for insomnia depends on the cause. Treatment may focus on treating an underlying condition that is causing insomnia. Keep a sleep diary to help you and your health care provider figure out what could be causing your insomnia. This information is not intended to replace advice given to you by your health care provider. Make sure you discuss any questions you have with your healthcare provider. Document Revised: 08/16/2020 Document Reviewed: 08/16/2020 Elsevier Patient Education  2022 Elsevier Inc.  

## 2021-06-19 LAB — CMP14+EGFR
ALT: 8 IU/L (ref 0–32)
AST: 19 IU/L (ref 0–40)
Albumin/Globulin Ratio: 2.5 — ABNORMAL HIGH (ref 1.2–2.2)
Albumin: 4.8 g/dL (ref 3.8–4.9)
Alkaline Phosphatase: 51 IU/L (ref 44–121)
BUN/Creatinine Ratio: 19 (ref 9–23)
BUN: 15 mg/dL (ref 6–24)
Bilirubin Total: 0.3 mg/dL (ref 0.0–1.2)
CO2: 29 mmol/L (ref 20–29)
Calcium: 10.1 mg/dL (ref 8.7–10.2)
Chloride: 97 mmol/L (ref 96–106)
Creatinine, Ser: 0.78 mg/dL (ref 0.57–1.00)
Globulin, Total: 1.9 g/dL (ref 1.5–4.5)
Glucose: 89 mg/dL (ref 65–99)
Potassium: 4.2 mmol/L (ref 3.5–5.2)
Sodium: 137 mmol/L (ref 134–144)
Total Protein: 6.7 g/dL (ref 6.0–8.5)
eGFR: 90 mL/min/{1.73_m2} (ref >59)

## 2021-06-19 LAB — CBC WITH DIFFERENTIAL/PLATELET
Basophils Absolute: 0 10*3/uL (ref 0.0–0.2)
Basos: 1 %
EOS (ABSOLUTE): 0.1 10*3/uL (ref 0.0–0.4)
Eos: 1 %
Hematocrit: 39.9 % (ref 34.0–46.6)
Hemoglobin: 12.8 g/dL (ref 11.1–15.9)
Immature Grans (Abs): 0 10*3/uL (ref 0.0–0.1)
Immature Granulocytes: 0 %
Lymphocytes Absolute: 1.8 10*3/uL (ref 0.7–3.1)
Lymphs: 33 %
MCH: 25.8 pg — ABNORMAL LOW (ref 26.6–33.0)
MCHC: 32.1 g/dL (ref 31.5–35.7)
MCV: 80 fL (ref 79–97)
Monocytes Absolute: 0.4 10*3/uL (ref 0.1–0.9)
Monocytes: 7 %
Neutrophils Absolute: 3.1 10*3/uL (ref 1.4–7.0)
Neutrophils: 58 %
Platelets: 312 10*3/uL (ref 150–450)
RBC: 4.96 x10E6/uL (ref 3.77–5.28)
RDW: 13 % (ref 11.7–15.4)
WBC: 5.4 10*3/uL (ref 3.4–10.8)

## 2021-06-19 LAB — LIPID PANEL
Chol/HDL Ratio: 3.1 ratio (ref 0.0–4.4)
Cholesterol, Total: 160 mg/dL (ref 100–199)
HDL: 52 mg/dL (ref >39)
LDL Chol Calc (NIH): 86 mg/dL (ref 0–99)
Triglycerides: 121 mg/dL (ref 0–149)
VLDL Cholesterol Cal: 22 mg/dL (ref 5–40)

## 2021-06-19 LAB — TSH: TSH: 1.71 u[IU]/mL (ref 0.450–4.500)

## 2021-06-25 ENCOUNTER — Telehealth: Payer: Self-pay | Admitting: Family Medicine

## 2021-06-25 ENCOUNTER — Ambulatory Visit: Payer: BC Managed Care – PPO | Admitting: Nurse Practitioner

## 2021-06-25 ENCOUNTER — Encounter: Payer: Self-pay | Admitting: Nurse Practitioner

## 2021-06-25 DIAGNOSIS — R300 Dysuria: Secondary | ICD-10-CM | POA: Insufficient documentation

## 2021-06-25 LAB — URINALYSIS, ROUTINE W REFLEX MICROSCOPIC
Bilirubin, UA: NEGATIVE
Glucose, UA: NEGATIVE
Ketones, UA: NEGATIVE
Leukocytes,UA: NEGATIVE
Nitrite, UA: NEGATIVE
Protein,UA: NEGATIVE
RBC, UA: NEGATIVE
Specific Gravity, UA: <1.005 — ABNORMAL LOW (ref 1.005–1.030)
Urobilinogen, Ur: 0.2 mg/dL (ref 0.2–1.0)
pH, UA: 5 (ref 5.0–7.5)

## 2021-06-25 NOTE — Progress Notes (Signed)
   Virtual Visit  Note Due to COVID-19 pandemic this visit was conducted virtually. This visit type was conducted due to national recommendations for restrictions regarding the COVID-19 Pandemic (e.g. social distancing, sheltering in place) in an effort to limit this patient's exposure and mitigate transmission in our community. All issues noted in this document were discussed and addressed.  A physical exam was not performed with this format.  I connected with [name] on 06/25/21 at 11:14 AM by telephone and verified that I am speaking with the correct person using two identifiers. Robin Harrington is currently located at home during visit. The provider, Daryll Drown, NP is located in their office at time of visit.  I discussed the limitations, risks, security and privacy concerns of performing an evaluation and management service by telephone and the availability of in person appointments. I also discussed with the patient that there may be a patient responsible charge related to this service. The patient expressed understanding and agreed to proceed.   History and Present Illness:  Dysuria  This is a new problem. The current episode started yesterday. The problem occurs intermittently. The problem has been unchanged. Quality: Frequency/urgency. The patient is experiencing no pain. There has been no fever. Associated symptoms include frequency and urgency. Pertinent negatives include no chills, discharge, flank pain, hesitancy, nausea or vomiting. She has tried nothing for the symptoms. There is no history of catheterization.     Review of Systems  Constitutional:  Negative for chills.  Gastrointestinal:  Negative for nausea and vomiting.  Genitourinary:  Positive for dysuria, frequency and urgency. Negative for flank pain and hesitancy.    Observations/Objective: Televisit patient not in distress.  Assessment and Plan: Completed urinalysis results pending.  Follow Up  Instructions: Follow-up with worsening unresolved symptoms.    I discussed the assessment and treatment plan with the patient. The patient was provided an opportunity to ask questions and all were answered. The patient agreed with the plan and demonstrated an understanding of the instructions.   The patient was advised to call back or seek an in-person evaluation if the symptoms worsen or if the condition fails to improve as anticipated.  The above assessment and management plan was discussed with the patient. The patient verbalized understanding of and has agreed to the management plan. Patient is aware to call the clinic if symptoms persist or worsen. Patient is aware when to return to the clinic for a follow-up visit. Patient educated on when it is appropriate to go to the emergency department.   Time call ended: 11:23 AM  I provided 9 minutes of  non face-to-face time during this encounter.    Daryll Drown, NP

## 2021-06-25 NOTE — Assessment & Plan Note (Signed)
Completed urinalysis results pending. 

## 2021-06-26 NOTE — Progress Notes (Signed)
Pt aware  of results. She wants to know if she needs to be referred because she is hurting and has fever.

## 2021-06-27 NOTE — Progress Notes (Deleted)
[name] [address] [place] [address] Phone: [phone] Subjective:    I'm seeing this patient by the request  of:  [name], FNP  CC:   [id number]  05/21/2021 Viscosupplementation given today.  Tolerated the procedure well.  We discussed icing regimen and home exercises.  Discussed increasing activity appropriately.  Discussed that if there is any redness or swelling that patient should seek medical attention.  Patient will otherwise follow-up with me again in 6 weeks  Updated 07/01/2021 IDALIZ TINKLE is a 55 y.o. female coming in with complaint of ***  Onset-  Location Duration-  Character- Aggravating factors- Reliving factors-  Therapies tried-  Severity-     Past Medical History:  Diagnosis Date   Acne    Allergy    Hyperlipidemia    No past surgical history on file. Social History   Socioeconomic History   Marital status: Married    Spouse name: Not on file   Number of children: Not on file   Years of education: Not on file   Highest education level: Not on file  Occupational History   Not on file  Tobacco Use   Smoking status: Never   Smokeless tobacco: Never  Vaping Use   Vaping Use: Never used  Substance and Sexual Activity   Alcohol use: No   Drug use: No   Sexual activity: Not on file  Other Topics Concern   Not on file  Social History Narrative   Not on file   Social Determinants of Health   Financial Resource Strain: Not on file  Food Insecurity: Not on file  Transportation Needs: Not on file  Physical Activity: Not on file  Stress: Not on file  Social Connections: Not on file   No Known Allergies Family History  Problem Relation Age of Onset   Hypertension Mother    Atrial fibrillation Mother    Cancer Mother        melanoma    Cancer Father        colon    Heart disease Father      Current Outpatient Medications (Cardiovascular):    rosuvastatin (CRESTOR) 40 MG  tablet, Take 1 tablet (40 mg total) by mouth daily.  Current Outpatient Medications (Respiratory):    fluticasone (FLONASE) 50 MCG/ACT nasal spray, Place 2 sprays into both nostrils daily.  Current Outpatient Medications (Analgesics):    meloxicam (MOBIC) 7.5 MG tablet, Take 1-2 tablets (7.5-15 mg total) by mouth daily.   Current Outpatient Medications (Other):    cyclobenzaprine (FLEXERIL) 10 MG tablet, Take 1 tablet (10 mg total) by mouth at bedtime.   doxycycline (ORACEA) 40 MG capsule, Take 1 capsule (40 mg total) by mouth daily.   Liraglutide -Weight Management (SAXENDA) 18 MG/3ML SOPN, Inject 0.6 mg into the skin daily for days 1-7, then increase to 1.2 mg into the skin daily for days 8-14, then increase to 1.8 mg daily for days 15-21, then increase to 2.4 mg daily for days 22-28, then increase to 3.0 mg daily for days 29-35.   omeprazole (PRILOSEC) 20 MG capsule, Take 1 capsule (20 mg total) by mouth 2 (two) times daily before a meal.   traZODone (DESYREL) 50 MG tablet, Take 0.5-1 tablets (25-50 mg total) by mouth at bedtime as needed for sleep.   valACYclovir (VALTREX) 1000 MG tablet, Take 1 tablet (1,000 mg total) by mouth daily.   Reviewed prior external information including notes and imaging from  primary care provider As well as notes that were available from care everywhere and other healthcare systems.  Past medical history, social, surgical and family history all reviewed in electronic medical record.  No pertanent information unless stated regarding to the chief complaint.   Review of Systems:  No headache, visual changes, nausea, vomiting, diarrhea, constipation, dizziness, abdominal pain, skin rash, fevers, chills, night sweats, weight loss, swollen lymph nodes, body aches, joint swelling, chest pain, shortness of breath, mood changes. POSITIVE muscle aches  Objective  There were no vitals taken for this visit.   General: No apparent distress alert and oriented x3  mood and affect normal, dressed appropriately.  HEENT: Pupils equal, extraocular movements intact  Respiratory: Patient's speak in full sentences and does not appear short of breath  Cardiovascular: No lower extremity edema, non tender, no erythema  Gait normal with good balance and coordination.  MSK:  Non tender with full range of motion and good stability and symmetric strength and tone of shoulders, elbows, wrist, hip, knee and ankles bilaterally.     Impression and Recommendations:     The above documentation has been reviewed and is accurate and complete Doristine Bosworth

## 2021-07-01 ENCOUNTER — Ambulatory Visit: Payer: BC Managed Care – PPO | Admitting: Family Medicine

## 2021-07-15 ENCOUNTER — Other Ambulatory Visit: Payer: Self-pay | Admitting: Family Medicine

## 2021-07-15 DIAGNOSIS — M25562 Pain in left knee: Secondary | ICD-10-CM

## 2021-07-15 DIAGNOSIS — G8929 Other chronic pain: Secondary | ICD-10-CM

## 2021-07-15 DIAGNOSIS — Z6830 Body mass index (BMI) 30.0-30.9, adult: Secondary | ICD-10-CM

## 2021-08-01 ENCOUNTER — Other Ambulatory Visit: Payer: Self-pay | Admitting: Family Medicine

## 2021-08-01 DIAGNOSIS — L700 Acne vulgaris: Secondary | ICD-10-CM

## 2021-08-07 ENCOUNTER — Encounter: Payer: Self-pay | Admitting: Family Medicine

## 2021-08-07 ENCOUNTER — Ambulatory Visit: Payer: BC Managed Care – PPO | Admitting: Family Medicine

## 2021-08-07 ENCOUNTER — Other Ambulatory Visit: Payer: Self-pay

## 2021-08-07 MED ORDER — SEMAGLUTIDE-WEIGHT MANAGEMENT 0.25 MG/0.5ML ~~LOC~~ SOAJ: 0.2500 mg | SUBCUTANEOUS | 0 refills | Status: DC

## 2021-08-07 NOTE — Patient Instructions (Signed)

## 2021-08-07 NOTE — Progress Notes (Signed)
Established Patient Office Visit  Subjective:  Patient ID: Robin Harrington, female    DOB: 10/05/1966  Age: 55 y.o. MRN: [id number]  CC:  Chief Complaint  Patient presents with   Obesity    HPI Robin Harrington presents for obesity follow up.   She has been on saxenda at the 3 mg dosage for about 6 weeks. She denies side effects but has not had any weight loss with it. She reports that it does not decrease her appetite. She continues to eat a healthy diet and remains very active. She has also failed phentermine and Qsymia in the past. She is interested in ozempic or mounjaro as she has had friends have great success with this for weight loss. She stopped taking the saxenda 5 days ago.   Wt Readings from Last 3 Encounters:  08/07/21 182 lb 8 oz (82.8 kg)  06/18/21 179 lb 6 oz (81.4 kg)  05/21/21 185 lb (83.9 kg)     Past Medical History:  Diagnosis Date   Acne    Allergy    Hyperlipidemia     No past surgical history on file.  Family History  Problem Relation Age of Onset   Hypertension Mother    Atrial fibrillation Mother    Cancer Mother        melanoma    Cancer Father        colon    Heart disease Father     Social History   Socioeconomic History   Marital status: Married    Spouse name: Not on file   Number of children: Not on file   Years of education: Not on file   Highest education level: Not on file  Occupational History   Not on file  Tobacco Use   Smoking status: Never   Smokeless tobacco: Never  Vaping Use   Vaping Use: Never used  Substance and Sexual Activity   Alcohol use: No   Drug use: No   Sexual activity: Not on file  Other Topics Concern   Not on file  Social History Narrative   Not on file   Social Determinants of Health   Financial Resource Strain: Not on file  Food Insecurity: Not on file  Transportation Needs: Not on file  Physical Activity: Not on file  Stress: Not on file  Social Connections: Not on file  Intimate  Partner Violence: Not on file    Outpatient Medications Prior to Visit  Medication Sig Dispense Refill   cyclobenzaprine (FLEXERIL) 10 MG tablet Take 1 tablet (10 mg total) by mouth at bedtime. 90 tablet 2   doxycycline (ORACEA) 40 MG capsule TAKE ONE CAPSULE BY MOUTH DAILY 30 capsule 6   fluticasone (FLONASE) 50 MCG/ACT nasal spray Place 2 sprays into both nostrils daily. 16 g 1   meloxicam (MOBIC) 15 MG tablet Take 0.5-1 tablets (7.5-15 mg total) by mouth daily. (NEEDS TO BE SEEN BEFORE NEXT REFILL) 30 tablet 0   omeprazole (PRILOSEC) 20 MG capsule Take 1 capsule (20 mg total) by mouth 2 (two) times daily before a meal. 90 capsule 0   rosuvastatin (CRESTOR) 40 MG tablet Take 1 tablet (40 mg total) by mouth daily. 90 tablet 3   traZODone (DESYREL) 50 MG tablet Take 0.5-1 tablets (25-50 mg total) by mouth at bedtime as needed for sleep. 30 tablet 3   valACYclovir (VALTREX) 1000 MG tablet Take 1 tablet (1,000 mg total) by mouth daily. 30 tablet 11   Liraglutide -Weight  Management (SAXENDA) 18 MG/3ML SOPN Inject 0.6 mg into the skin daily for days 1-7, then increase to 1.2 mg into the skin daily for days 8-14, then increase to 1.8 mg daily for days 15-21, then increase to 2.4 mg daily for days 22-28, then increase to 3.0 mg daily for days 29-35. (Patient not taking: Reported on 08/07/2021) 15 mL 0   No facility-administered medications prior to visit.    No Known Allergies  ROS Review of Systems As per HPI>    Objective:    Physical Exam Vitals and nursing note reviewed.  Constitutional:      General: She is not in acute distress.    Appearance: She is not ill-appearing, toxic-appearing or diaphoretic.  Cardiovascular:     Rate and Rhythm: Normal rate and regular rhythm.  Pulmonary:     Effort: Pulmonary effort is normal. No respiratory distress.  Musculoskeletal:     Right lower leg: No edema.     Left lower leg: No edema.  Skin:    General: Skin is warm and dry.  Neurological:      General: No focal deficit present.     Mental Status: She is alert and oriented to person, place, and time.  Psychiatric:        Mood and Affect: Mood normal.        Behavior: Behavior normal.        Thought Content: Thought content normal.        Judgment: Judgment normal.    BP 122/65   Pulse (!) 55   Temp 98.1 F (36.7 C) (Temporal)   Ht _0  (1.651 m)   Wt 182 lb 8 oz (82.8 kg)   BMI 30.37 kg/m  Wt Readings from Last 3 Encounters:  08/07/21 182 lb 8 oz (82.8 kg)  06/18/21 179 lb 6 oz (81.4 kg)  05/21/21 185 lb (83.9 kg)     Health Maintenance Due  Topic Date Due   MAMMOGRAM  12/25/2015   Zoster Vaccines- Shingrix (1 of 2) Never done   COVID-19 Vaccine (3 - Booster for Pfizer series) 07/01/2020    There are no preventive care reminders to display for this patient.  Lab Results  Component Value Date   TSH 1.710 06/18/2021   Lab Results  Component Value Date   WBC 5.4 06/18/2021   HGB 12.8 06/18/2021   HCT 39.9 06/18/2021   MCV 80 06/18/2021   PLT 312 06/18/2021   Lab Results  Component Value Date   NA 137 06/18/2021   K 4.2 06/18/2021   CO2 29 06/18/2021   GLUCOSE 89 06/18/2021   BUN 15 06/18/2021   CREATININE 0.78 06/18/2021   BILITOT 0.3 06/18/2021   ALKPHOS 51 06/18/2021   AST 19 06/18/2021   ALT 8 06/18/2021   PROT 6.7 06/18/2021   ALBUMIN 4.8 06/18/2021   CALCIUM 10.1 06/18/2021   EGFR 90 06/18/2021   Lab Results  Component Value Date   CHOL 160 06/18/2021   Lab Results  Component Value Date   HDL 52 06/18/2021   Lab Results  Component Value Date   LDLCALC 86 06/18/2021   Lab Results  Component Value Date   TRIG 121 06/18/2021   Lab Results  Component Value Date   CHOLHDL 3.1 06/18/2021   No results found for: HGBA1C    Assessment & Plan:   Robin Harrington was seen today for obesity.  Diagnoses and all orders for this visit:  Morbid obesity ([place]) No weight loss  with saxenda after 12 weeks. Has also failed phentermine and  qsymia in the past. Discussed that wegovy is not covered by her insurance and that mounjaro is only indicated for T2DM at this time. Our office is not currently doing PAs for semaglutide for obesity. She is aware and would like to try to complete the PA herself.  -     Semaglutide-Weight Management 0.25 MG/0.5ML SOAJ; Inject 0.25 mg into the skin once a week.  Follow-up: Return in about 6 weeks (around 09/18/2021). For chronic follow up.   The patient indicates understanding of these issues and agrees with the plan.    Gwenlyn Perking, FNP

## 2021-08-08 ENCOUNTER — Telehealth: Payer: Self-pay | Admitting: *Deleted

## 2021-08-08 NOTE — Telephone Encounter (Signed)
Prior Auth for [employer]  Key: [id number]  PA # [id number]   Your information has been submitted to [employer]. To check for an updated outcome later, reopen this PA request from your dashboard.  If [employer] has not responded to your request within 24 hours, contact [employer] at [phone]. If you think there may be a problem with your PA request, use our live chat feature at the bottom right.

## 2021-08-09 NOTE — Telephone Encounter (Signed)
[facility] [employer]  received a request from your provider for coverage of Wegovy (semaglutide injection). As long as you remain covered by the [facility] and there are no changes to your plan benefits, this request is approved for the following time period: 08/08/2021 - 03/08/2022    Pt is aware medication approved and will call pharmacy.

## 2021-08-13 NOTE — Progress Notes (Signed)
[name] [address] [place] [address] Phone: [phone] Subjective:   [name], am serving as a scribe for Dr. Antoine Primas. This visit occurred during the SARS-CoV-2 public health emergency.  Safety protocols were in place, including screening questions prior to the visit, additional usage of staff PPE, and extensive cleaning of exam room while observing appropriate contact time as indicated for disinfecting solutions.   I'm seeing this patient by the request  of:  [name], FNP  CC: Right knee pain follow-up  [id number]  05/21/2021 Viscosupplementation given today.  Tolerated the procedure well.  We discussed icing regimen and home exercises.  Discussed increasing activity appropriately.  Discussed that if there is any redness or swelling that patient should seek medical attention.  Patient will otherwise follow-up with me again in 6 weeks  Updated 08/14/2021 RICKELLE SYLVESTRE is a 55 y.o. female coming in with complaint of knee pain patient was seen previously and given a viscosupplementation injection nearly 10 weeks ago.  Reviewed other charts including patient with healthy weight and wellness and primary care notes since we have seen patient.  Patient is continuing to work on weight loss.  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  Patient states that she continued to have swelling for 3 weeks and then her pain improved. Patient is able to play each day without swelling. Does continue to soreness and stiffness after playing. Intermittent aching that will keep her awake at night. Patient was prescribed meloxicam and feels like this help to reduce her pain.       Past Medical History:  Diagnosis Date   Acne    Allergy    Hyperlipidemia    No past surgical history on file. Social History   Socioeconomic History    Marital status: Married    Spouse name: Not on file   Number of children: Not on file   Years of education: Not on file   Highest education level: Not on file  Occupational History   Not on file  Tobacco Use   Smoking status: Never   Smokeless tobacco: Never  Vaping Use   Vaping Use: Never used  Substance and Sexual Activity   Alcohol use: No   Drug use: No   Sexual activity: Not on file  Other Topics Concern   Not on file  Social History Narrative   Not on file   Social Determinants of Health   Financial Resource Strain: Not on file  Food Insecurity: Not on file  Transportation Needs: Not on file  Physical Activity: Not on file  Stress: Not on file  Social Connections: Not on file   No Known Allergies Family History  Problem Relation Age of Onset   Hypertension Mother    Atrial fibrillation Mother    Cancer Mother        melanoma    Cancer Father        colon    Heart disease Father      Current Outpatient Medications (Cardiovascular):    rosuvastatin (CRESTOR) 40 MG tablet, Take 1 tablet (40 mg total) by mouth daily.  Current Outpatient Medications (Respiratory):    fluticasone (FLONASE) 50 MCG/ACT nasal spray, Place 2 sprays into both nostrils daily.  Current Outpatient Medications (Analgesics):    meloxicam (MOBIC) 15 MG tablet, Take 0.5-1 tablets (7.5-15 mg total) by mouth daily. (NEEDS TO BE  SEEN BEFORE NEXT REFILL)   Current Outpatient Medications (Other):    cyclobenzaprine (FLEXERIL) 10 MG tablet, Take 1 tablet (10 mg total) by mouth at bedtime.   doxycycline (ORACEA) 40 MG capsule, TAKE ONE CAPSULE BY MOUTH DAILY   Liraglutide -Weight Management (SAXENDA) 18 MG/3ML SOPN, Inject 0.6 mg into the skin daily for days 1-7, then increase to 1.2 mg into the skin daily for days 8-14, then increase to 1.8 mg daily for days 15-21, then increase to 2.4 mg daily for days 22-28, then increase to 3.0 mg daily for days 29-35.   omeprazole (PRILOSEC) 20 MG capsule,  Take 1 capsule (20 mg total) by mouth 2 (two) times daily before a meal.   Semaglutide-Weight Management 0.25 MG/0.5ML SOAJ, Inject 0.25 mg into the skin once a week.   traZODone (DESYREL) 50 MG tablet, Take 0.5-1 tablets (25-50 mg total) by mouth at bedtime as needed for sleep.   valACYclovir (VALTREX) 1000 MG tablet, Take 1 tablet (1,000 mg total) by mouth daily.   Reviewed prior external information including notes and imaging from  primary care provider As well as notes that were available from care everywhere and other healthcare systems.  Past medical history, social, surgical and family history all reviewed in electronic medical record.  No pertanent information unless stated regarding to the chief complaint.   Review of Systems:  No headache, visual changes, nausea, vomiting, diarrhea, constipation, dizziness, abdominal pain, skin rash, fevers, chills, night sweats, weight loss, swollen lymph nodes, body aches, joint swelling, chest pain, shortness of breath, mood changes. POSITIVE muscle aches  Objective  Blood pressure 102/68, pulse 84, height 5\' 5"  (1.651 m), weight 180 lb (81.6 kg), SpO2 97 %.   General: No apparent distress alert and oriented x3 mood and affect normal, dressed appropriately.  HEENT: Pupils equal, extraocular movements intact  Respiratory: Patient's speak in full sentences and does not appear short of breath  Cardiovascular: No lower extremity edema, non tender, no erythema  Gait normal with good balance and coordination.  MSK: Knee exam bilaterally does not show any significant inflammation.  Patient does have mild crepitus of the patella noted.  Still no significant swelling noted at this time.    Impression and Recommendations:     The above documentation has been reviewed and is accurate and complete , DO

## 2021-08-14 ENCOUNTER — Other Ambulatory Visit: Payer: Self-pay

## 2021-08-14 ENCOUNTER — Encounter: Payer: Self-pay | Admitting: Family Medicine

## 2021-08-14 ENCOUNTER — Ambulatory Visit: Payer: BC Managed Care – PPO | Admitting: Family Medicine

## 2021-08-14 DIAGNOSIS — M1710 Unilateral primary osteoarthritis, unspecified knee: Secondary | ICD-10-CM | POA: Diagnosis not present

## 2021-08-14 NOTE — Assessment & Plan Note (Signed)
Patient did have viscosupplementation and initially felt tested for some time but is starting to have worsening pain again.  Do feel at this point that we should consider the possibility of PRP as a possibility to.  Patient will look into this.  Given handout.  We discussed that we can repeat the viscosupplementation in 6 months as well if necessary.  Patient will consider.  Follow-up again in 6 to 8 weeks.

## 2021-08-14 NOTE — Patient Instructions (Signed)
Congrats on Pickleball  PRP handout See me again in December but if you want to do PRP give [place] a heads up

## 2021-09-03 ENCOUNTER — Telehealth: Payer: Self-pay | Admitting: Family Medicine

## 2021-09-04 ENCOUNTER — Other Ambulatory Visit: Payer: Self-pay | Admitting: Family Medicine

## 2021-09-04 DIAGNOSIS — Z6830 Body mass index (BMI) 30.0-30.9, adult: Secondary | ICD-10-CM

## 2021-09-04 MED ORDER — SAXENDA 18 MG/3ML ~~LOC~~ SOPN: PEN_INJECTOR | SUBCUTANEOUS | 0 refills | Status: DC

## 2021-09-04 NOTE — Telephone Encounter (Signed)
I though patient was now on ozempic weekly?

## 2021-09-05 ENCOUNTER — Telehealth: Payer: Self-pay | Admitting: Family Medicine

## 2021-09-05 NOTE — Telephone Encounter (Signed)
Robin Harrington (Key: [id number]) Saxenda 18MG pen-injectors   Form [employer] Electronic PA Form [id number] NCPDP)  PA started for pt - sent to plan   Pt aware

## 2021-09-06 NOTE — Telephone Encounter (Signed)
Sent to plan.

## 2021-09-09 NOTE — Telephone Encounter (Signed)
As long as you remain covered by the [facility] and there are no changes to your plan benefits, this request is approved for the following time period: 09/05/2021 - 01/03/2022 Approvals may be subject to dosing limits in accordance with FDA approved labeling, evidence-based practice guidelines or your prescription drug plan benefits. If you have not already done so, you may ask your pharmacist to fill the prescription. If you have questions, please call Customer Care toll-free at the number on your [facility] Plan ID card toll-free at [phone].  Pharmacy and patient aware.

## 2021-09-17 ENCOUNTER — Encounter: Payer: Self-pay | Admitting: Family Medicine

## 2021-09-18 ENCOUNTER — Ambulatory Visit: Payer: BC Managed Care – PPO | Admitting: Family Medicine

## 2021-10-09 ENCOUNTER — Ambulatory Visit: Payer: BC Managed Care – PPO | Admitting: Family Medicine

## 2021-10-09 ENCOUNTER — Encounter: Payer: Self-pay | Admitting: Family Medicine

## 2021-10-09 VITALS — BP 119/67 | HR 61 | Temp 97.9°F | Ht 65.0 in | Wt 179.5 lb

## 2021-10-09 DIAGNOSIS — E663 Overweight: Secondary | ICD-10-CM | POA: Diagnosis not present

## 2021-10-09 DIAGNOSIS — F5109 Other insomnia not due to a substance or known physiological condition: Secondary | ICD-10-CM | POA: Diagnosis not present

## 2021-10-09 MED ORDER — ZOLPIDEM TARTRATE 5 MG PO TABS
5.0000 mg | ORAL_TABLET | Freq: Every evening | ORAL | 1 refills | Status: DC | PRN
Start: 2021-10-09 — End: 2022-04-17

## 2021-10-09 NOTE — Progress Notes (Signed)
Established Patient Office Visit  Subjective:  Patient ID: Robin Harrington, female    DOB: 1965-10-30  Age: 55 y.o. MRN: [id number]  CC:  Chief Complaint  Patient presents with   Obesity   Insomnia    HPI Robin Harrington presents for follow up of obesity and insomnia.   She has not been able to get [facility] because it has been on backorder. She has been back on Saxenda for about 3 weeks. She has lost 7 lbs by her scale since restarting on saxenda. She denies side effects. She is still active with pickleball. She is going to play in a national tournament soon. She has been working to improve her diet. She has been eating leaner meats and eating more plant based foods.   She reports that trazodone has not helped with insomnia. Reports insomnia is infrequent and situational, usually when she is on vacation. She has used Ambien 5 mg in the past with good results and would like to try this again.   Wt Readings from Last 3 Encounters:  10/09/21 179 lb 8 oz (81.4 kg)  08/14/21 180 lb (81.6 kg)  08/07/21 182 lb 8 oz (82.8 kg)     Past Medical History:  Diagnosis Date   Acne    Allergy    Hyperlipidemia     No past surgical history on file.  Family History  Problem Relation Age of Onset   Hypertension Mother    Atrial fibrillation Mother    Cancer Mother        melanoma    Cancer Father        colon    Heart disease Father     Social History   Socioeconomic History   Marital status: Married    Spouse name: Not on file   Number of children: Not on file   Years of education: Not on file   Highest education level: Not on file  Occupational History   Not on file  Tobacco Use   Smoking status: Never   Smokeless tobacco: Never  Vaping Use   Vaping Use: Never used  Substance and Sexual Activity   Alcohol use: No   Drug use: No   Sexual activity: Not on file  Other Topics Concern   Not on file  Social History Narrative   Not on file   Social Determinants of  Health   Financial Resource Strain: Not on file  Food Insecurity: Not on file  Transportation Needs: Not on file  Physical Activity: Not on file  Stress: Not on file  Social Connections: Not on file  Intimate Partner Violence: Not on file    Outpatient Medications Prior to Visit  Medication Sig Dispense Refill   doxycycline (ORACEA) 40 MG capsule TAKE ONE CAPSULE BY MOUTH DAILY 30 capsule 6   fluticasone (FLONASE) 50 MCG/ACT nasal spray Place 2 sprays into both nostrils daily. 16 g 1   Liraglutide -Weight Management (SAXENDA) 18 MG/3ML SOPN Inject 0.6 mg into the skin daily for days 1-7, then increase to 1.2 mg into the skin daily for days 8-14, then increase to 1.8 mg daily for days 15-21, then increase to 2.4 mg daily for days 22-28, then increase to 3.0 mg daily for days 29-35. 3 mL 0   omeprazole (PRILOSEC) 20 MG capsule Take 1 capsule (20 mg total) by mouth 2 (two) times daily before a meal. 90 capsule 0   rosuvastatin (CRESTOR) 40 MG tablet Take 1 tablet (40  total) by mouth daily. 90 tablet 3  ° valACYclovir (VALTREX) 1000 MG tablet Take 1 tablet (1,000 mg total) by mouth daily. 30 tablet 11  ° meloxicam (MOBIC) 15 MG tablet Take 0.5-1 tablets (7.5-15 mg total) by mouth daily. (NEEDS TO BE SEEN BEFORE NEXT REFILL) (Patient not taking: Reported on 10/09/2021) 30 tablet 0  ° Semaglutide-Weight Management 0.25 MG/0.5ML SOAJ Inject 0.25 mg into the skin once a week. (Patient not taking: Reported on 10/09/2021) 2 mL 0  ° traZODone (DESYREL) 50 MG tablet Take 0.5-1 tablets (25-50 mg total) by mouth at bedtime as needed for sleep. (Patient not taking: Reported on 10/09/2021) 30 tablet 3  ° °No facility-administered medications prior to visit.  ° ° °No Known Allergies ° °ROS °Review of Systems °As per HPI.  °  °Objective:  °  °Physical Exam °Vitals and nursing note reviewed.  °Constitutional:   °   General: She is not in acute distress. °   Appearance: She is not ill-appearing, toxic-appearing or  diaphoretic.  °Cardiovascular:  °   Rate and Rhythm: Normal rate and regular rhythm.  °   Heart sounds: Normal heart sounds. No murmur heard. °Pulmonary:  °   Effort: Pulmonary effort is normal. No respiratory distress.  °   Breath sounds: Normal breath sounds. No wheezing or rales.  °Musculoskeletal:  °   Right lower leg: No edema.  °   Left lower leg: No edema.  °Skin: °   General: Skin is warm and dry.  °Neurological:  °   General: No focal deficit present.  °   Mental Status: She is alert and oriented to person, place, and time.  °Psychiatric:     °   Mood and Affect: Mood normal.     °   Behavior: Behavior normal.  ° ° °BP 119/67    Pulse 61    Temp 97.9 °F (36.6 °C) (Temporal)    Ht 5' 5" (1.651 m)    Wt 179 lb 8 oz (81.4 kg)    BMI 29.87 kg/m²  °Wt Readings from Last 3 Encounters:  °10/09/21 179 lb 8 oz (81.4 kg)  °08/14/21 180 lb (81.6 kg)  °08/07/21 182 lb 8 oz (82.8 kg)  ° ° ° °Health Maintenance Due  °Topic Date Due  ° MAMMOGRAM  12/25/2015  ° Zoster Vaccines- Shingrix (1 of 2) Never done  ° COVID-19 Vaccine (3 - Booster for Pfizer series) 03/26/2020  ° ° °There are no preventive care reminders to display for this patient. ° °Lab Results  °Component Value Date  ° TSH 1.710 06/18/2021  ° °Lab Results  °Component Value Date  ° WBC 5.4 06/18/2021  ° HGB 12.8 06/18/2021  ° HCT 39.9 06/18/2021  ° MCV 80 06/18/2021  ° PLT 312 06/18/2021  ° °Lab Results  °Component Value Date  ° NA 137 06/18/2021  ° K 4.2 06/18/2021  ° CO2 29 06/18/2021  ° GLUCOSE 89 06/18/2021  ° BUN 15 06/18/2021  ° CREATININE 0.78 06/18/2021  ° BILITOT 0.3 06/18/2021  ° ALKPHOS 51 06/18/2021  ° AST 19 06/18/2021  ° ALT 8 06/18/2021  ° PROT 6.7 06/18/2021  ° ALBUMIN 4.8 06/18/2021  ° CALCIUM 10.1 06/18/2021  ° EGFR 90 06/18/2021  ° °Lab Results  °Component Value Date  ° CHOL 160 06/18/2021  ° °Lab Results  °Component Value Date  ° HDL 52 06/18/2021  ° °Lab Results  °Component Value Date  ° LDLCALC 86 06/18/2021  ° °Lab Results  °Component    Value Date  ° TRIG 121 06/18/2021  ° °Lab Results  °Component Value Date  ° CHOLHDL 3.1 06/18/2021  ° °No results found for: HGBA1C ° °  °Assessment & Plan:  ° °Reneka was seen today for obesity and insomnia. ° °Diagnoses and all orders for this visit: ° °Overweight (BMI 25.0-29.9) °Continue saxenda until Wegovy is available. Continue diet and exercise.  ° °Situational insomnia °Ambien as below. Discussed will need CSA and toxassure if further refills are required.  °-     zolpidem (AMBIEN) 5 MG tablet; Take 1 tablet (5 mg total) by mouth at bedtime as needed for sleep. ° °Follow-up: Return in about 6 weeks (around 11/20/2021) for weight management.  ° °The patient indicates understanding of these issues and agrees with the plan. ° °Tiffany M Morgan, FNP °

## 2021-10-09 NOTE — Progress Notes (Deleted)
[name] [address] [place] [address] Phone: [phone] Subjective:    I'm seeing this patient by the request  of:  [name], FNP  CC:   [id number]  08/14/2021 Patient did have viscosupplementation and initially felt tested for some time but is starting to have worsening pain again.  Do feel at this point that we should consider the possibility of PRP as a possibility to.  Patient will look into this.  Given handout.  We discussed that we can repeat the viscosupplementation in 6 months as well if necessary.  Patient will consider.  Follow-up again in 6 to 8 weeks.  Update 10/10/2021 RUTA CAPECE is a 55 y.o. female coming in with complaint of B knee pain. Patient states       Past Medical History:  Diagnosis Date   Acne    Allergy    Hyperlipidemia    No past surgical history on file. Social History   Socioeconomic History   Marital status: Married    Spouse name: Not on file   Number of children: Not on file   Years of education: Not on file   Highest education level: Not on file  Occupational History   Not on file  Tobacco Use   Smoking status: Never   Smokeless tobacco: Never  Vaping Use   Vaping Use: Never used  Substance and Sexual Activity   Alcohol use: No   Drug use: No   Sexual activity: Not on file  Other Topics Concern   Not on file  Social History Narrative   Not on file   Social Determinants of Health   Financial Resource Strain: Not on file  Food Insecurity: Not on file  Transportation Needs: Not on file  Physical Activity: Not on file  Stress: Not on file  Social Connections: Not on file   No Known Allergies Family History  Problem Relation Age of Onset   Hypertension Mother    Atrial fibrillation Mother    Cancer Mother        melanoma    Cancer Father        colon    Heart disease Father      Current Outpatient Medications (Cardiovascular):    rosuvastatin (CRESTOR)  40 MG tablet, Take 1 tablet (40 mg total) by mouth daily.  Current Outpatient Medications (Respiratory):    fluticasone (FLONASE) 50 MCG/ACT nasal spray, Place 2 sprays into both nostrils daily.  Current Outpatient Medications (Analgesics):    meloxicam (MOBIC) 15 MG tablet, Take 0.5-1 tablets (7.5-15 mg total) by mouth daily. (NEEDS TO BE SEEN BEFORE NEXT REFILL) (Patient not taking: Reported on 10/09/2021)   Current Outpatient Medications (Other):    doxycycline (ORACEA) 40 MG capsule, TAKE ONE CAPSULE BY MOUTH DAILY   Liraglutide -Weight Management (SAXENDA) 18 MG/3ML SOPN, Inject 0.6 mg into the skin daily for days 1-7, then increase to 1.2 mg into the skin daily for days 8-14, then increase to 1.8 mg daily for days 15-21, then increase to 2.4 mg daily for days 22-28, then increase to 3.0 mg daily for days 29-35.   omeprazole (PRILOSEC) 20 MG capsule, Take 1 capsule (20 mg total) by mouth 2 (two) times daily before a meal.   Semaglutide-Weight Management 0.25 MG/0.5ML SOAJ, Inject 0.25 mg into the skin once a week. (Patient not taking: Reported on 10/09/2021)   traZODone (DESYREL) 50 MG tablet, Take 0.5-1 tablets (25-50 mg total) by mouth at bedtime  as needed for sleep. (Patient not taking: Reported on 10/09/2021)   valACYclovir (VALTREX) 1000 MG tablet, Take 1 tablet (1,000 mg total) by mouth daily.   zolpidem (AMBIEN) 5 MG tablet, Take 1 tablet (5 mg total) by mouth at bedtime as needed for sleep.   Reviewed prior external information including notes and imaging from  primary care provider As well as notes that were available from care everywhere and other healthcare systems.  Past medical history, social, surgical and family history all reviewed in electronic medical record.  No pertanent information unless stated regarding to the chief complaint.   Review of Systems:  No headache, visual changes, nausea, vomiting, diarrhea, constipation, dizziness, abdominal pain, skin rash, fevers,  chills, night sweats, weight loss, swollen lymph nodes, body aches, joint swelling, chest pain, shortness of breath, mood changes. POSITIVE muscle aches  Objective  There were no vitals taken for this visit.   General: No apparent distress alert and oriented x3 mood and affect normal, dressed appropriately.  HEENT: Pupils equal, extraocular movements intact  Respiratory: Patient's speak in full sentences and does not appear short of breath  Cardiovascular: No lower extremity edema, non tender, no erythema  Gait normal with good balance and coordination.  MSK:  Non tender with full range of motion and good stability and symmetric strength and tone of shoulders, elbows, wrist, hip, knee and ankles bilaterally.     Impression and Recommendations:     The above documentation has been reviewed and is accurate and complete Wilford Grist

## 2021-10-09 NOTE — Patient Instructions (Signed)

## 2021-10-10 ENCOUNTER — Ambulatory Visit: Payer: BC Managed Care – PPO | Admitting: Family Medicine

## 2021-11-01 NOTE — Progress Notes (Signed)
[name] [address] [place] [address] Phone: [phone] Subjective:   Robin Harrington, am serving as a scribe for Dr. Antoine Primas. This visit occurred during the SARS-CoV-2 public health emergency.  Safety protocols were in place, including screening questions prior to the visit, additional usage of staff PPE, and extensive cleaning of exam room while observing appropriate contact time as indicated for disinfecting solutions.  I'm seeing this patient by the request  of:  Robin Earing, FNP  CC: Bilateral knee pain  [id number]  08/14/2021 Patient did have viscosupplementation and initially felt tested for some time but is starting to have worsening pain again.  Do feel at this point that we should consider the possibility of PRP as a possibility to.  Patient will look into this.  Given handout.  We discussed that we can repeat the viscosupplementation in 6 months as well if necessary.  Patient will consider.  Follow-up again in 6 to 8 weeks.  Update 11/04/2021 Robin Harrington is a 56 y.o. female coming in with complaint of B knee pain. Is here for PRP today. Patient states that her knees have been achy. Not stopping her from playing. Overall are improving.        Past Medical History:  Diagnosis Date   Acne    Allergy    Hyperlipidemia    No past surgical history on file. Social History   Socioeconomic History   Marital status: Married    Spouse name: Not on file   Number of children: Not on file   Years of education: Not on file   Highest education level: Not on file  Occupational History   Not on file  Tobacco Use   Smoking status: Never   Smokeless tobacco: Never  Vaping Use   Vaping Use: Never used  Substance and Sexual Activity   Alcohol use: No   Drug use: No   Sexual activity: Not on file  Other Topics Concern   Not on file  Social History Narrative   Not on file   Social Determinants of Health    Financial Resource Strain: Not on file  Food Insecurity: Not on file  Transportation Needs: Not on file  Physical Activity: Not on file  Stress: Not on file  Social Connections: Not on file   No Known Allergies Family History  Problem Relation Age of Onset   Hypertension Mother    Atrial fibrillation Mother    Cancer Mother        melanoma    Cancer Father        colon    Heart disease Father      Current Outpatient Medications (Cardiovascular):    rosuvastatin (CRESTOR) 40 MG tablet, Take 1 tablet (40 mg total) by mouth daily.  Current Outpatient Medications (Respiratory):    fluticasone (FLONASE) 50 MCG/ACT nasal spray, Place 2 sprays into both nostrils daily.  Current Outpatient Medications (Analgesics):    meloxicam (MOBIC) 15 MG tablet, Take 0.5-1 tablets (7.5-15 mg total) by mouth daily. (NEEDS TO BE SEEN BEFORE NEXT REFILL)   Current Outpatient Medications (Other):    doxycycline (ORACEA) 40 MG capsule, TAKE ONE CAPSULE BY MOUTH DAILY   Liraglutide -Weight Management (SAXENDA) 18 MG/3ML SOPN, Inject 0.6 mg into the skin daily for days 1-7, then increase to 1.2 mg into the skin daily for days 8-14, then increase to 1.8 mg daily for days 15-21, then increase to 2.4 mg daily for days  22-28, then increase to 3.0 mg daily for days 29-35.   omeprazole (PRILOSEC) 20 MG capsule, Take 1 capsule (20 mg total) by mouth 2 (two) times daily before a meal.   Semaglutide-Weight Management 0.25 MG/0.5ML SOAJ, Inject 0.25 mg into the skin once a week.   traZODone (DESYREL) 50 MG tablet, Take 0.5-1 tablets (25-50 mg total) by mouth at bedtime as needed for sleep.   valACYclovir (VALTREX) 1000 MG tablet, Take 1 tablet (1,000 mg total) by mouth daily.   zolpidem (AMBIEN) 5 MG tablet, Take 1 tablet (5 mg total) by mouth at bedtime as needed for sleep.    Objective  Blood pressure 114/68, pulse 62, height 5\' 5"  (1.651 m), weight 184 lb (83.5 kg), SpO2 98 %.   General: No apparent  distress alert and oriented x3 mood and affect normal, dressed appropriately.  HEENT: Pupils equal, extraocular movements intact  Respiratory: Patient's speak in full sentences and does not appear short of breath  Cardiovascular: No lower extremity edema, non tender, no erythema  Gait normal with good balance and coordination.  MSK: Bilateral knee exam does have some tenderness to palpation in the anteriorly.  After informed written and verbal consent, patient was seated on exam table. Right knee was prepped with alcohol swab and utilizing anterolateral approach, patient's right knee space was injected with 4cc  marcaine 0.5% then injected 1 5 cc of precentrifuge PRP. Patient tolerated the procedure well without immediate complications.  After informed written and verbal consent, patient was seated on exam table. Left knee was prepped with alcohol swab and utilizing anterolateral approach, patient's left knee space was injected with 2 cc of 0.5% Marcaine and then injected 5 cc of centrifuge PRP leukocyte poor.  Patient tolerated the procedure well without immediate complications.   Impression and Recommendations:     The above documentation has been reviewed and is accurate and complete , DO

## 2021-11-04 ENCOUNTER — Other Ambulatory Visit: Payer: Self-pay

## 2021-11-04 ENCOUNTER — Ambulatory Visit: Payer: Self-pay | Admitting: Family Medicine

## 2021-11-04 ENCOUNTER — Ambulatory Visit: Payer: BC Managed Care – PPO | Admitting: Family Medicine

## 2021-11-04 ENCOUNTER — Encounter: Payer: Self-pay | Admitting: Family Medicine

## 2021-11-04 DIAGNOSIS — M1710 Unilateral primary osteoarthritis, unspecified knee: Secondary | ICD-10-CM

## 2021-11-04 NOTE — Patient Instructions (Signed)
No ice or IBU for 3 days ?See me again in 4-6 weeks ?

## 2021-11-04 NOTE — Assessment & Plan Note (Signed)
Bilateral injections given today.  Tolerated the procedure well.  Post PRP protocol given.  Discussed with athletic trainer.  Follow-up with me again in 4 to 6 weeks.

## 2021-11-06 ENCOUNTER — Other Ambulatory Visit: Payer: Self-pay | Admitting: Family Medicine

## 2021-11-06 DIAGNOSIS — E782 Mixed hyperlipidemia: Secondary | ICD-10-CM

## 2021-11-06 DIAGNOSIS — K219 Gastro-esophageal reflux disease without esophagitis: Secondary | ICD-10-CM

## 2021-11-20 ENCOUNTER — Ambulatory Visit: Payer: BC Managed Care – PPO | Admitting: Nurse Practitioner

## 2021-12-02 NOTE — Progress Notes (Signed)
[name] [address] [place] [address] Phone: [phone] Subjective:   [name], am serving as a scribe for Dr. Antoine Primas.  This visit occurred during the SARS-CoV-2 public health emergency.  Safety protocols were in place, including screening questions prior to the visit, additional usage of staff PPE, and extensive cleaning of exam room while observing appropriate contact time as indicated for disinfecting solutions.  I'm seeing this patient by the request  of:  [name], FNP  CC: bilateral knee pain   [id number]  11/04/2021 Bilateral injections given today.  Tolerated the procedure well.  Post PRP protocol given.  Discussed with athletic trainer.  Follow-up with me again in 4 to 6 weeks.  Updated 12/03/2021 KONICA STANKOWSKI is a 56 y.o. female coming in with complaint of bilateral knee pain. PRP f/u Given 11/04/21.  Patient states that she has not tested things out much other than going tubing this weekend. Had increase in L knee pain and swelling the following day. Did feel like she had improvement prior to tubing. Played some easy games on Friday prior to tubing.       Past Medical History:  Diagnosis Date   Acne    Allergy    Hyperlipidemia    No past surgical history on file. Social History   Socioeconomic History   Marital status: Married    Spouse name: Not on file   Number of children: Not on file   Years of education: Not on file   Highest education level: Not on file  Occupational History   Not on file  Tobacco Use   Smoking status: Never   Smokeless tobacco: Never  Vaping Use   Vaping Use: Never used  Substance and Sexual Activity   Alcohol use: No   Drug use: No   Sexual activity: Not on file  Other Topics Concern   Not on file  Social History Narrative   Not on file   Social Determinants of Health   Financial Resource Strain: Not on file  Food Insecurity: Not on file   Transportation Needs: Not on file  Physical Activity: Not on file  Stress: Not on file  Social Connections: Not on file   No Known Allergies Family History  Problem Relation Age of Onset   Hypertension Mother    Atrial fibrillation Mother    Cancer Mother        melanoma    Cancer Father        colon    Heart disease Father      Current Outpatient Medications (Cardiovascular):    rosuvastatin (CRESTOR) 40 MG tablet, TAKE ONE TABLET BY MOUTH DAILY  Current Outpatient Medications (Respiratory):    fluticasone (FLONASE) 50 MCG/ACT nasal spray, Place 2 sprays into both nostrils daily.  Current Outpatient Medications (Analgesics):    meloxicam (MOBIC) 15 MG tablet, Take 1 tablet (15 mg total) by mouth daily.   Current Outpatient Medications (Other):    doxycycline (ORACEA) 40 MG capsule, TAKE ONE CAPSULE BY MOUTH DAILY   Liraglutide -Weight Management (SAXENDA) 18 MG/3ML SOPN, Inject 0.6 mg into the skin daily for days 1-7, then increase to 1.2 mg into the skin daily for days 8-14, then increase to 1.8 mg daily for days 15-21, then increase to 2.4 mg daily for days 22-28, then increase to 3.0 mg daily for days 29-35.   omeprazole (PRILOSEC) 20 MG capsule, Take 1 capsule (20 mg total) by mouth  2 (two) times daily before a meal.   Semaglutide-Weight Management 0.25 MG/0.5ML SOAJ, Inject 0.25 mg into the skin once a week.   traZODone (DESYREL) 50 MG tablet, Take 0.5-1 tablets (25-50 mg total) by mouth at bedtime as needed for sleep.   valACYclovir (VALTREX) 1000 MG tablet, Take 1 tablet (1,000 mg total) by mouth daily.   zolpidem (AMBIEN) 5 MG tablet, Take 1 tablet (5 mg total) by mouth at bedtime as needed for sleep.   Reviewed prior external information including notes and imaging from  primary care provider As well as notes that were available from care everywhere and other healthcare systems.  Past medical history, social, surgical and family history all reviewed in electronic  medical record.  No pertanent information unless stated regarding to the chief complaint.   Review of Systems:  No headache, visual changes, nausea, vomiting, diarrhea, constipation, dizziness, abdominal pain, skin rash, fevers, chills, night sweats, weight loss, swollen lymph nodes, body aches,  chest pain, shortness of breath, mood changes. POSITIVE muscle aches, joint swelling  Objective  Blood pressure 102/66, pulse 60, height 5\' 5"  (1.651 m), weight 186 lb (84.4 kg), SpO2 97 %.   General: No apparent distress alert and oriented x3 mood and affect normal, dressed appropriately.  HEENT: Pupils equal, extraocular movements intact  Respiratory: Patient's speak in full sentences and does not appear short of breath  Cardiovascular: No lower extremity edema, non tender, no erythema  Gait normal with good balance and coordination.  MSK: Bilateral knee exam show the patient does have some mild crepitus noted.  No significant swelling of the right knee noted.  Lateral tracking of the left knee.  Mild positive patellar grind test noted.  Limited muscular skeletal ultrasound was performed and interpreted by , M  Limited ultrasound of the knees bilaterally show the patient has significant decrease in hypoechoic changes of the right patellofemoral joint.  Patient's left the patellofemoral joint has a mild increase in the hypoechoic changes consistent with a small effusion.  Does have narrowing of the joint space bilaterally. Impression: Improvement in the right knee effusion but worsening of the left knee effusion.    Impression and Recommendations:     The above documentation has been reviewed and is accurate and complete Antoine Primas, DO

## 2021-12-03 ENCOUNTER — Ambulatory Visit: Payer: Self-pay

## 2021-12-03 ENCOUNTER — Other Ambulatory Visit: Payer: Self-pay

## 2021-12-03 ENCOUNTER — Ambulatory Visit: Payer: BC Managed Care – PPO | Admitting: Family Medicine

## 2021-12-03 ENCOUNTER — Encounter: Payer: Self-pay | Admitting: Family Medicine

## 2021-12-03 VITALS — BP 102/66 | HR 60 | Ht 65.0 in | Wt 186.0 lb

## 2021-12-03 DIAGNOSIS — M1710 Unilateral primary osteoarthritis, unspecified knee: Secondary | ICD-10-CM

## 2021-12-03 DIAGNOSIS — G8929 Other chronic pain: Secondary | ICD-10-CM | POA: Diagnosis not present

## 2021-12-03 DIAGNOSIS — M25561 Pain in right knee: Secondary | ICD-10-CM

## 2021-12-03 DIAGNOSIS — M25562 Pain in left knee: Secondary | ICD-10-CM | POA: Diagnosis not present

## 2021-12-03 MED ORDER — MELOXICAM 15 MG PO TABS: 15.0000 mg | ORAL_TABLET | Freq: Every day | ORAL | 0 refills | Status: DC

## 2021-12-03 NOTE — Patient Instructions (Addendum)
Meloxicam 15mg   Take daily for one week at at time Do not use NSAIDS such as Advil or Aleve when taking Meloxicam It is ok to use Tylenol for additional pain relief Ice after activity See me in 2 months

## 2021-12-03 NOTE — Assessment & Plan Note (Signed)
Bilateral but now having worse on the right than the left.  We will refill patient's meloxicam.  Given 15 mg to take daily for 7-day burst.  Patient will do this and icing regimen, give more time for the PRP to work.  Follow-up with me again in 4 to 6 weeks otherwise.

## 2021-12-23 ENCOUNTER — Telehealth: Payer: Self-pay

## 2021-12-23 NOTE — Telephone Encounter (Signed)
Patient called states that she had the PRP done in both knees and she paid 475 per knee. Once when she arrived and once when she left. Patient received a bill that states that she owes another 475. Patient is confused to why that is since we do not bill it through insurance.  ?

## 2021-12-25 NOTE — Telephone Encounter (Signed)
Patient called back. ?Gave her Lindsay's message. ? ?

## 2021-12-25 NOTE — Telephone Encounter (Signed)
Lmovm for pt to return call.  ?Pt was billed an extra $475 in error and this has been corrected.  ?

## 2022-01-07 ENCOUNTER — Other Ambulatory Visit: Payer: Self-pay

## 2022-01-07 DIAGNOSIS — Z1231 Encounter for screening mammogram for malignant neoplasm of breast: Secondary | ICD-10-CM

## 2022-01-13 ENCOUNTER — Other Ambulatory Visit: Payer: Self-pay | Admitting: Family Medicine

## 2022-01-13 ENCOUNTER — Telehealth: Payer: Self-pay | Admitting: *Deleted

## 2022-01-13 DIAGNOSIS — F5109 Other insomnia not due to a substance or known physiological condition: Secondary | ICD-10-CM

## 2022-01-13 NOTE — Telephone Encounter (Signed)
PA for Saxenda-In Process ? ?Key: [id number] ? ?[id number] ? ? ?Your information has been submitted to [employer]. To check for an updated outcome later, reopen this PA request from your dashboard. ? ?If [employer] has not responded to your request within 24 hours, contact [employer] at [phone]. If you think there may be a problem with your PA request, use our live chat feature at the bottom right. ?

## 2022-01-14 ENCOUNTER — Ambulatory Visit: Payer: BC Managed Care – PPO | Admitting: Family Medicine

## 2022-01-17 ENCOUNTER — Encounter: Payer: Self-pay | Admitting: Nurse Practitioner

## 2022-01-17 ENCOUNTER — Ambulatory Visit: Payer: BC Managed Care – PPO | Admitting: Nurse Practitioner

## 2022-01-17 ENCOUNTER — Encounter: Payer: Self-pay | Admitting: Family Medicine

## 2022-01-17 VITALS — BP 100/63 | HR 56 | Temp 98.6°F | Ht 65.0 in | Wt 179.0 lb

## 2022-01-17 DIAGNOSIS — E782 Mixed hyperlipidemia: Secondary | ICD-10-CM | POA: Diagnosis not present

## 2022-01-17 DIAGNOSIS — K2100 Gastro-esophageal reflux disease with esophagitis, without bleeding: Secondary | ICD-10-CM | POA: Diagnosis not present

## 2022-01-17 LAB — CBC WITH DIFFERENTIAL/PLATELET
Basophils Absolute: 0 10*3/uL (ref 0.0–0.2)
Basos: 1 %
EOS (ABSOLUTE): 0.1 10*3/uL (ref 0.0–0.4)
Eos: 2 %
Hematocrit: 41.8 % (ref 34.0–46.6)
Hemoglobin: 13.4 g/dL (ref 11.1–15.9)
Immature Grans (Abs): 0 10*3/uL (ref 0.0–0.1)
Immature Granulocytes: 0 %
Lymphocytes Absolute: 1.3 10*3/uL (ref 0.7–3.1)
Lymphs: 32 %
MCH: 26.3 pg — ABNORMAL LOW (ref 26.6–33.0)
MCHC: 32.1 g/dL (ref 31.5–35.7)
MCV: 82 fL (ref 79–97)
Monocytes Absolute: 0.4 10*3/uL (ref 0.1–0.9)
Monocytes: 9 %
Neutrophils Absolute: 2.3 10*3/uL (ref 1.4–7.0)
Neutrophils: 56 %
Platelets: 305 10*3/uL (ref 150–450)
RBC: 5.1 x10E6/uL (ref 3.77–5.28)
RDW: 12.9 % (ref 11.7–15.4)
WBC: 4.2 10*3/uL (ref 3.4–10.8)

## 2022-01-17 LAB — COMPREHENSIVE METABOLIC PANEL
ALT: 12 IU/L (ref 0–32)
AST: 18 IU/L (ref 0–40)
Albumin/Globulin Ratio: 2.1 (ref 1.2–2.2)
Albumin: 4.7 g/dL (ref 3.8–4.9)
Alkaline Phosphatase: 56 IU/L (ref 44–121)
BUN/Creatinine Ratio: 14 (ref 9–23)
BUN: 12 mg/dL (ref 6–24)
Bilirubin Total: 0.3 mg/dL (ref 0.0–1.2)
CO2: 26 mmol/L (ref 20–29)
Calcium: 9.8 mg/dL (ref 8.7–10.2)
Chloride: 104 mmol/L (ref 96–106)
Creatinine, Ser: 0.84 mg/dL (ref 0.57–1.00)
Globulin, Total: 2.2 g/dL (ref 1.5–4.5)
Glucose: 86 mg/dL (ref 70–99)
Potassium: 4.5 mmol/L (ref 3.5–5.2)
Sodium: 142 mmol/L (ref 134–144)
Total Protein: 6.9 g/dL (ref 6.0–8.5)
eGFR: 82 mL/min/{1.73_m2} (ref >59)

## 2022-01-17 LAB — LIPID PANEL
Chol/HDL Ratio: 2.7 ratio (ref 0.0–4.4)
Cholesterol, Total: 176 mg/dL (ref 100–199)
HDL: 66 mg/dL (ref >39)
LDL Chol Calc (NIH): 94 mg/dL (ref 0–99)
Triglycerides: 90 mg/dL (ref 0–149)
VLDL Cholesterol Cal: 16 mg/dL (ref 5–40)

## 2022-01-17 NOTE — Assessment & Plan Note (Signed)
Symptoms well managed on current medication no changes necessary. ?

## 2022-01-17 NOTE — Progress Notes (Signed)
? ?Established Patient Office Visit ? ?Subjective:  ?Patient ID: Robin Harrington, female    DOB: May 27, 1966  Age: 56 y.o. MRN: [id number] ? ?CC:  ?Chief Complaint  ?Patient presents with  ? chronic disease management  ? ? ?HPI ?Robin Harrington presents for Mixed hyperlipidemia  ?Pt presents with hyperlipidemia. Patient was diagnosed in 09/07/2016. Compliance with treatment has been good The patient is compliant with medications, maintains a low cholesterol diet , follows up as directed , and maintains an exercise regimen . The patient denies experiencing any hypercholesterolemia related symptoms.   ? ?GERD, Follow up: ? ?The patient was last seen for GERD 1 years ago. ?Changes made since that visit include omeprazole 20 mg capsule twice daily.. ? ?She reports good compliance with treatment. ?She is not having side effects. . ? ?She IS experiencing  no new symptoms . ?She is NOT experiencing belching and eructation, bilious reflux, chest pain, choking on food, cough, or deep pressure at base of neck ? ?-----------------------------------------------------------------------------------------  ? ? ?Past Medical History:  ?Diagnosis Date  ? Acne   ? Allergy   ? Hyperlipidemia   ? ? ?History reviewed. No pertinent surgical history. ? ?Family History  ?Problem Relation Age of Onset  ? Hypertension Mother   ? Atrial fibrillation Mother   ? Cancer Mother   ?     melanoma   ? Cancer Father   ?     colon   ? Heart disease Father   ? ? ?Social History  ? ?Socioeconomic History  ? Marital status: Married  ?  Spouse name: Not on file  ? Number of children: Not on file  ? Years of education: Not on file  ? Highest education level: Not on file  ?Occupational History  ? Not on file  ?Tobacco Use  ? Smoking status: Never  ? Smokeless tobacco: Never  ?Vaping Use  ? Vaping Use: Never used  ?Substance and Sexual Activity  ? Alcohol use: No  ? Drug use: No  ? Sexual activity: Not on file  ?Other Topics Concern  ? Not on file  ?Social  History Narrative  ? Not on file  ? ?Social Determinants of Health  ? ?Financial Resource Strain: Not on file  ?Food Insecurity: Not on file  ?Transportation Needs: Not on file  ?Physical Activity: Not on file  ?Stress: Not on file  ?Social Connections: Not on file  ?Intimate Partner Violence: Not on file  ? ? ?Outpatient Medications Prior to Visit  ?Medication Sig Dispense Refill  ? doxycycline (ORACEA) 40 MG capsule TAKE ONE CAPSULE BY MOUTH DAILY 30 capsule 6  ? fluticasone (FLONASE) 50 MCG/ACT nasal spray Place 2 sprays into both nostrils daily. 16 g 1  ? meloxicam (MOBIC) 15 MG tablet Take 1 tablet (15 mg total) by mouth daily. 90 tablet 0  ? omeprazole (PRILOSEC) 20 MG capsule Take 1 capsule (20 mg total) by mouth 2 (two) times daily before a meal. 90 capsule 0  ? rosuvastatin (CRESTOR) 40 MG tablet TAKE ONE TABLET BY MOUTH DAILY 90 tablet 0  ? valACYclovir (VALTREX) 1000 MG tablet Take 1 tablet (1,000 mg total) by mouth daily. 30 tablet 11  ? zolpidem (AMBIEN) 5 MG tablet Take 1 tablet (5 mg total) by mouth at bedtime as needed for sleep. 15 tablet 1  ? Liraglutide -Weight Management (SAXENDA) 18 MG/3ML SOPN Inject 0.6 mg into the skin daily for days 1-7, then increase to 1.2 mg into the skin daily  for days 8-14, then increase to 1.8 mg daily for days 15-21, then increase to 2.4 mg daily for days 22-28, then increase to 3.0 mg daily for days 29-35. (Patient not taking: Reported on 01/17/2022) 3 mL 0  ? Semaglutide-Weight Management 0.25 MG/0.5ML SOAJ Inject 0.25 mg into the skin once a week. (Patient not taking: Reported on 01/17/2022) 2 mL 0  ? traZODone (DESYREL) 50 MG tablet Take 0.5-1 tablets (25-50 mg total) by mouth at bedtime as needed for sleep. (Patient not taking: Reported on 01/17/2022) 30 tablet 3  ? ?No facility-administered medications prior to visit.  ? ? ?No Known Allergies ? ?ROS ?Review of Systems  ?Constitutional: Negative.   ?HENT: Negative.    ?Respiratory: Negative.    ?Cardiovascular:  Negative.   ?Gastrointestinal: Negative.   ?Genitourinary: Negative.   ?All other systems reviewed and are negative. ? ?  ?Objective:  ?  ?Physical Exam ?Vitals and nursing note reviewed.  ?Constitutional:   ?   Appearance: She is overweight.  ?HENT:  ?   Head: Normocephalic.  ?   Right Ear: External ear normal.  ?   Left Ear: Ear canal normal.  ?   Nose: Nose normal.  ?Cardiovascular:  ?   Rate and Rhythm: Normal rate and regular rhythm.  ?   Pulses: Normal pulses.  ?   Heart sounds: Normal heart sounds.  ?Pulmonary:  ?   Effort: Pulmonary effort is normal.  ?   Breath sounds: Normal breath sounds.  ?Skin: ?   General: Skin is warm.  ?   Findings: No rash.  ?Neurological:  ?   Mental Status: She is alert.  ?Psychiatric:     ?   Behavior: Behavior is cooperative.  ? ? ?BP 100/63   Pulse (!) 56   Temp 98.6 ?F (37 ?C)   Ht _0  (1.651 m)   Wt 179 lb (81.2 kg)   SpO2 97%   BMI 29.79 kg/m?  ?Wt Readings from Last 3 Encounters:  ?01/17/22 179 lb (81.2 kg)  ?12/03/21 186 lb (84.4 kg)  ?11/04/21 184 lb (83.5 kg)  ? ? ? ?Health Maintenance Due  ?Topic Date Due  ? MAMMOGRAM  12/25/2015  ? ? ?There are no preventive care reminders to display for this patient. ? ?Lab Results  ?Component Value Date  ? TSH 1.710 06/18/2021  ? ?Lab Results  ?Component Value Date  ? WBC 5.4 06/18/2021  ? HGB 12.8 06/18/2021  ? HCT 39.9 06/18/2021  ? MCV 80 06/18/2021  ? PLT 312 06/18/2021  ? ?Lab Results  ?Component Value Date  ? NA 137 06/18/2021  ? K 4.2 06/18/2021  ? CO2 29 06/18/2021  ? GLUCOSE 89 06/18/2021  ? BUN 15 06/18/2021  ? CREATININE 0.78 06/18/2021  ? BILITOT 0.3 06/18/2021  ? ALKPHOS 51 06/18/2021  ? AST 19 06/18/2021  ? ALT 8 06/18/2021  ? PROT 6.7 06/18/2021  ? ALBUMIN 4.8 06/18/2021  ? CALCIUM 10.1 06/18/2021  ? EGFR 90 06/18/2021  ? ?Lab Results  ?Component Value Date  ? CHOL 160 06/18/2021  ? ?Lab Results  ?Component Value Date  ? HDL 52 06/18/2021  ? ?Lab Results  ?Component Value Date  ? [place] 86 06/18/2021  ? ?Lab  Results  ?Component Value Date  ? TRIG 121 06/18/2021  ? ?Lab Results  ?Component Value Date  ? CHOLHDL 3.1 06/18/2021  ? ?No results found for: HGBA1C ? ?  ?Assessment & Plan:  ? ?Problem List Items Addressed This  Visit   ? ?  ? Digestive  ? Gastroesophageal reflux disease - Primary  ?  Symptoms well managed on current medication no changes necessary. ?  ?  ? Relevant Orders  ? CBC with Differential  ? Comprehensive metabolic panel  ?  ? Other  ? Mixed hyperlipidemia  ?  Symptoms well managed on current medication dose no changes necessary. ?  ?  ? Relevant Orders  ? Lipid Panel  ? ? ?No orders of the defined types were placed in this encounter. ? ? ?Follow-up: Return in about 3 months (around 04/18/2022) for chroninc disease mgt with PCP.  ? ? ?Ivy Lynn, NP ?

## 2022-01-17 NOTE — Patient Instructions (Signed)
Calorie Counting for Weight Loss ?Calories are units of energy. Your body needs a certain number of calories from food to keep going throughout the day. When you eat or drink more calories than your body needs, your body stores the extra calories mostly as fat. When you eat or drink fewer calories than your body needs, your body burns fat to get the energy it needs. ?Calorie counting means keeping track of how many calories you eat and drink each day. Calorie counting can be helpful if you need to lose weight. If you eat fewer calories than your body needs, you should lose weight. Ask your health care provider what a healthy weight is for you. ?For calorie counting to work, you will need to eat the right number of calories each day to lose a healthy amount of weight per week. A dietitian can help you figure out how many calories you need in a day and will suggest ways to reach your calorie goal. ?A healthy amount of weight to lose each week is usually 1-2 lb (0.5-0.9 kg). This usually means that your daily calorie intake should be reduced by 500-750 calories. ?Eating 1,200-1,500 calories a day can help most women lose weight. ?Eating 1,500-1,800 calories a day can help most men lose weight. ?What do I need to know about calorie counting? ?Work with your health care provider or dietitian to determine how many calories you should get each day. To meet your daily calorie goal, you will need to: ?Find out how many calories are in each food that you would like to eat. Try to do this before you eat. ?Decide how much of the food you plan to eat. ?Keep a food log. Do this by writing down what you ate and how many calories it had. ?To successfully lose weight, it is important to balance calorie counting with a healthy lifestyle that includes regular activity. ?Where do I find calorie information? ?The number of calories in a food can be found on a Nutrition Facts label. If a food does not have a Nutrition Facts label, try to  look up the calories online or ask your dietitian for help. ?Remember that calories are listed per serving. If you choose to have more than one serving of a food, you will have to multiply the calories per serving by the number of servings you plan to eat. For example, the label on a package of bread might say that a serving size is 1 slice and that there are 90 calories in a serving. If you eat 1 slice, you will have eaten 90 calories. If you eat 2 slices, you will have eaten 180 calories. ?How do I keep a food log? ?After each time that you eat, record the following in your food log as soon as possible: ?What you ate. Be sure to include toppings, sauces, and other extras on the food. ?How much you ate. This can be measured in cups, ounces, or number of items. ?How many calories were in each food and drink. ?The total number of calories in the food you ate. ?Keep your food log near you, such as in a pocket-sized notebook or on an app or website on your mobile phone. Some programs will calculate calories for you and show you how many calories you have left to meet your daily goal. ?What are some portion-control tips? ?Know how many calories are in a serving. This will help you know how many servings you can have of a certain food. ?  Use a measuring cup to measure serving sizes. You could also try weighing out portions on a kitchen scale. With time, you will be able to estimate serving sizes for some foods. ?Take time to put servings of different foods on your favorite plates or in your favorite bowls and cups so you know what a serving looks like. ?Try not to eat straight from a food's packaging, such as from a bag or box. Eating straight from the package makes it hard to see how much you are eating and can lead to overeating. Put the amount you would like to eat in a cup or on a plate to make sure you are eating the right portion. ?Use smaller plates, glasses, and bowls for smaller portions and to prevent  overeating. ?Try not to multitask. For example, avoid watching TV or using your computer while eating. If it is time to eat, sit down at a table and enjoy your food. This will help you recognize when you are full. It will also help you be more mindful of what and how much you are eating. ?What are tips for following this plan? ?Reading food labels ?Check the calorie count compared with the serving size. The serving size may be smaller than what you are used to eating. ?Check the source of the calories. Try to choose foods that are high in protein, fiber, and vitamins, and low in saturated fat, trans fat, and sodium. ?Shopping ?Read nutrition labels while you shop. This will help you make healthy decisions about which foods to buy. ?Pay attention to nutrition labels for low-fat or fat-free foods. These foods sometimes have the same number of calories or more calories than the full-fat versions. They also often have added sugar, starch, or salt to make up for flavor that was removed with the fat. ?Make a grocery list of lower-calorie foods and stick to it. ?Cooking ?Try to cook your favorite foods in a healthier way. For example, try baking instead of frying. ?Use low-fat dairy products. ?Meal planning ?Use more fruits and vegetables. One-half of your plate should be fruits and vegetables. ?Include lean proteins, such as chicken, turkey, and fish. ?Lifestyle ?Each week, aim to do one of the following: ?150 minutes of moderate exercise, such as walking. ?75 minutes of vigorous exercise, such as running. ?General information ?Know how many calories are in the foods you eat most often. This will help you calculate calorie counts faster. ?Find a way of tracking calories that works for you. Get creative. Try different apps or programs if writing down calories does not work for you. ?What foods should I eat? ? ?Eat nutritious foods. It is better to have a nutritious, high-calorie food, such as an avocado, than a food with  few nutrients, such as a bag of potato chips. ?Use your calories on foods and drinks that will fill you up and will not leave you hungry soon after eating. ?Examples of foods that fill you up are nuts and nut butters, vegetables, lean proteins, and high-fiber foods such as whole grains. High-fiber foods are foods with more than 5 g of fiber per serving. ?Pay attention to calories in drinks. Low-calorie drinks include water and unsweetened drinks. ?The items listed above may not be a complete list of foods and beverages you can eat. Contact a dietitian for more information. ?What foods should I limit? ?Limit foods or drinks that are not good sources of vitamins, minerals, or protein or that are high in unhealthy fats. These include: ?  Candy. ?Other sweets. ?Sodas, specialty coffee drinks, alcohol, and juice. ?The items listed above may not be a complete list of foods and beverages you should avoid. Contact a dietitian for more information. ?How do I count calories when eating out? ?Pay attention to portions. Often, portions are much larger when eating out. Try these tips to keep portions smaller: ?Consider sharing a meal instead of getting your own. ?If you get your own meal, eat only half of it. Before you start eating, ask for a container and put half of your meal into it. ?When available, consider ordering smaller portions from the menu instead of full portions. ?Pay attention to your food and drink choices. Knowing the way food is cooked and what is included with the meal can help you eat fewer calories. ?If calories are listed on the menu, choose the lower-calorie options. ?Choose dishes that include vegetables, fruits, whole grains, low-fat dairy products, and lean proteins. ?Choose items that are boiled, broiled, grilled, or steamed. Avoid items that are buttered, battered, fried, or served with cream sauce. Items labeled as crispy are usually fried, unless stated otherwise. ?Choose water, low-fat milk,  unsweetened iced tea, or other drinks without added sugar. If you want an alcoholic beverage, choose a lower-calorie option, such as a glass of wine or light beer. ?Ask for dressings, sauces, and syrups on the side. T

## 2022-01-17 NOTE — Assessment & Plan Note (Signed)
Symptoms well managed on current medication dose no changes necessary. ?

## 2022-01-17 NOTE — Progress Notes (Signed)
Pt gave me her prescription bottle of trazodone to dispose of, I immediately took entire bottle to pharmacist Vanice Sarah to dispose. ?

## 2022-01-28 NOTE — Telephone Encounter (Signed)
Your prior authorization for Saxenda has been approved! °MORE INFO °For eligible patients, copay assistance may be available. To learn more and be redirected to the Saxenda® website, click on the "More Info" button to the right. Please also note that you may need to schedule a follow-up visit with your patient prior to the expiration of this prior authorization, as updated patient weight may be required for reauthorization. ° °Message from plan: Your PA request has been approved. Additional information will be provided in the approval communication. (Message [id number]) ° ° °Pharmacy aware.  °

## 2022-02-20 ENCOUNTER — Other Ambulatory Visit: Payer: Self-pay | Admitting: Family Medicine

## 2022-02-20 DIAGNOSIS — K219 Gastro-esophageal reflux disease without esophagitis: Secondary | ICD-10-CM

## 2022-03-18 ENCOUNTER — Ambulatory Visit
Admission: RE | Admit: 2022-03-18 | Discharge: 2022-03-18 | Disposition: A | Discharge status: Home / Self Care | Payer: BC Managed Care – PPO | Source: Ambulatory Visit | Attending: Family Medicine | Admitting: Family Medicine

## 2022-03-18 ENCOUNTER — Ambulatory Visit (INDEPENDENT_AMBULATORY_CARE_PROVIDER_SITE_OTHER): Payer: BC Managed Care – PPO

## 2022-03-18 ENCOUNTER — Ambulatory Visit: Payer: BC Managed Care – PPO

## 2022-03-18 DIAGNOSIS — Z1231 Encounter for screening mammogram for malignant neoplasm of breast: Secondary | ICD-10-CM

## 2022-03-18 DIAGNOSIS — Z111 Encounter for screening for respiratory tuberculosis: Secondary | ICD-10-CM

## 2022-03-18 NOTE — Progress Notes (Signed)
Patient came in for ppd placement - placed left forearm - patient tolerated well

## 2022-03-20 ENCOUNTER — Ambulatory Visit (INDEPENDENT_AMBULATORY_CARE_PROVIDER_SITE_OTHER): Payer: BC Managed Care – PPO | Admitting: *Deleted

## 2022-03-20 LAB — TB SKIN TEST
Induration: 0 mm
TB Skin Test: NEGATIVE

## 2022-03-24 ENCOUNTER — Ambulatory Visit: Payer: BC Managed Care – PPO

## 2022-03-26 ENCOUNTER — Ambulatory Visit: Payer: BC Managed Care – PPO

## 2022-03-27 ENCOUNTER — Other Ambulatory Visit: Payer: Self-pay | Admitting: Family Medicine

## 2022-03-27 DIAGNOSIS — L700 Acne vulgaris: Secondary | ICD-10-CM

## 2022-04-01 NOTE — Progress Notes (Unsigned)
[name] [address] [place] [address] Phone: [phone] Subjective:   Robin Harrington, am serving as a scribe for Dr. Antoine Primas.   I'm seeing this patient by the request  of:  [name], FNP  CC: bilateral knee pain    SWN:[id number]  12/03/2021 Bilateral but now having worse on the right than the left.  We will refill patient's meloxicam.  Given 15 mg to take daily for 7-day burst.  Patient will do this and icing regimen, give more time for the PRP to work.  Follow-up with me again in 4 to 6 weeks otherwise.  Update 04/02/2022 Robin Harrington is a 56 y.o. female coming in with complaint of B knee pain. Patient states that she had increase in pain in R knee. Pain will be so bad that she is unable to stand on knee. Pain will immediately subside. Patient will hyperflex knee to chest and this will "reset' knee and she is able to continue activity. Playing more now that school is out. L knee is also bothering patient.        Past Medical History:  Diagnosis Date   Acne    Allergy    Hyperlipidemia    No past surgical history on file. Social History   Socioeconomic History   Marital status: Married    Spouse name: Not on file   Number of children: Not on file   Years of education: Not on file   Highest education level: Not on file  Occupational History   Not on file  Tobacco Use   Smoking status: Never   Smokeless tobacco: Never  Vaping Use   Vaping Use: Never used  Substance and Sexual Activity   Alcohol use: No   Drug use: No   Sexual activity: Not on file  Other Topics Concern   Not on file  Social History Narrative   Not on file   Social Determinants of Health   Financial Resource Strain: Not on file  Food Insecurity: Not on file  Transportation Needs: Not on file  Physical Activity: Not on file  Stress: Not on file  Social Connections: Not on file   No Known Allergies Family History  Problem Relation  Age of Onset   Hypertension Mother    Atrial fibrillation Mother    Cancer Mother        melanoma    Cancer Father        colon    Heart disease Father    Breast cancer Neg Hx      Current Outpatient Medications (Cardiovascular):    rosuvastatin (CRESTOR) 40 MG tablet, TAKE ONE TABLET BY MOUTH DAILY  Current Outpatient Medications (Respiratory):    fluticasone (FLONASE) 50 MCG/ACT nasal spray, Place 2 sprays into both nostrils daily.  Current Outpatient Medications (Analgesics):    meloxicam (MOBIC) 15 MG tablet, Take 1 tablet (15 mg total) by mouth daily.   Current Outpatient Medications (Other):    doxycycline (ORACEA) 40 MG capsule, TAKE ONE CAPSULE BY MOUTH DAILY   Liraglutide -Weight Management (SAXENDA) 18 MG/3ML SOPN, Inject 0.6 mg into the skin daily for days 1-7, then increase to 1.2 mg into the skin daily for days 8-14, then increase to 1.8 mg daily for days 15-21, then increase to 2.4 mg daily for days 22-28, then increase to 3.0 mg daily for days 29-35.   omeprazole (PRILOSEC) 20 MG capsule, TAKE ONE CAPSULE BY MOUTH TWICE DAILY BEFORE  A MEAL   Semaglutide-Weight Management 0.25 MG/0.5ML SOAJ, Inject 0.25 mg into the skin once a week.   traZODone (DESYREL) 50 MG tablet, Take 0.5-1 tablets (25-50 mg total) by mouth at bedtime as needed for sleep.   valACYclovir (VALTREX) 1000 MG tablet, Take 1 tablet (1,000 mg total) by mouth daily.   zolpidem (AMBIEN) 5 MG tablet, Take 1 tablet (5 mg total) by mouth at bedtime as needed for sleep.   Reviewed prior external information including notes and imaging from  primary care provider As well as notes that were available from care everywhere and other healthcare systems.  Past medical history, social, surgical and family history all reviewed in electronic medical record.  No pertanent information unless stated regarding to the chief complaint.   Review of Systems:  No headache, visual changes, nausea, vomiting, diarrhea,  constipation, dizziness, abdominal pain, skin rash, fevers, chills, night sweats, weight loss, swollen lymph nodes, body aches, chest pain, shortness of breath, mood changes. POSITIVE muscle aches, joint swelling  Objective  Blood pressure 114/76, pulse (!) 103, height 5\' 5"  (1.651 m), weight 179 lb (81.2 kg), SpO2 98 %.   General: No apparent distress alert and oriented x3 mood and affect normal, dressed appropriately.  HEENT: Pupils equal, extraocular movements intact  Respiratory: Patient's speak in full sentences and does not appear short of breath  Cardiovascular: No lower extremity edema, non tender, no erythema  Bilateral knees do have trace effusion noted.  Patient does have crepitus noted to the patellofemoral joint with a positive grind test right greater than left.  Patient does have full range of motion noted at the moment.  Limited muscular skeletal ultrasound was performed and interpreted by , M  Limited ultrasound does show the patient does have a trace effusion of the patellofemoral joint bilaterally.  Narrowing of the patellofemoral joint bilaterally. Impression: Patellofemoral arthritis  After informed written and verbal consent, patient was seated on exam table. Right knee was prepped with alcohol swab and utilizing anterolateral approach, patient's right knee space was injected with 4:1  marcaine 0.5%: Kenalog 40mg /dL. Patient tolerated the procedure well without immediate complications.  After informed written and verbal consent, patient was seated on exam table. Left knee was prepped with alcohol swab and utilizing anterolateral approach, patient's left knee space was injected with 4:1  marcaine 0.5%: Kenalog 40mg /dL. Patient tolerated the procedure well without immediate complications.    Impression and Recommendations:     The above documentation has been reviewed and is accurate and complete Antoine Primas, DO

## 2022-04-02 ENCOUNTER — Encounter: Payer: Self-pay | Admitting: Family Medicine

## 2022-04-02 ENCOUNTER — Ambulatory Visit: Payer: BC Managed Care – PPO | Admitting: Family Medicine

## 2022-04-02 ENCOUNTER — Ambulatory Visit: Payer: Self-pay

## 2022-04-02 VITALS — BP 114/76 | HR 103 | Ht 65.0 in | Wt 179.0 lb

## 2022-04-02 DIAGNOSIS — M1710 Unilateral primary osteoarthritis, unspecified knee: Secondary | ICD-10-CM | POA: Diagnosis not present

## 2022-04-02 DIAGNOSIS — M25561 Pain in right knee: Secondary | ICD-10-CM | POA: Diagnosis not present

## 2022-04-02 DIAGNOSIS — M222X2 Patellofemoral disorders, left knee: Secondary | ICD-10-CM

## 2022-04-02 DIAGNOSIS — G8929 Other chronic pain: Secondary | ICD-10-CM | POA: Diagnosis not present

## 2022-04-02 DIAGNOSIS — M25562 Pain in left knee: Secondary | ICD-10-CM | POA: Diagnosis not present

## 2022-04-02 DIAGNOSIS — M222X1 Patellofemoral disorders, right knee: Secondary | ICD-10-CM | POA: Diagnosis not present

## 2022-04-02 NOTE — Patient Instructions (Signed)
Injected both knees Good luck at nationals See me again in 2-3 months

## 2022-04-02 NOTE — Assessment & Plan Note (Signed)
Chronic problem with worsening symptoms.  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  Patient elected to go back to the steroid injections and see if this will be more beneficial than the viscosupplementation or the PRP.  Warned that otherwise patient would need to consider the possibility of surgical intervention but hopefully that will not be necessary.  Can consider the possibility of repeating the PRP as well.  Follow-up again in 6 to 8 weeks

## 2022-04-07 ENCOUNTER — Encounter: Payer: Self-pay | Admitting: Family Medicine

## 2022-04-17 ENCOUNTER — Encounter: Payer: Self-pay | Admitting: Family Medicine

## 2022-04-17 ENCOUNTER — Ambulatory Visit: Payer: BC Managed Care – PPO | Admitting: Family Medicine

## 2022-04-17 VITALS — BP 116/78 | HR 78 | Temp 97.9°F | Ht 65.0 in | Wt 175.0 lb

## 2022-04-17 DIAGNOSIS — M1710 Unilateral primary osteoarthritis, unspecified knee: Secondary | ICD-10-CM | POA: Diagnosis not present

## 2022-04-17 DIAGNOSIS — F5109 Other insomnia not due to a substance or known physiological condition: Secondary | ICD-10-CM

## 2022-04-17 DIAGNOSIS — L700 Acne vulgaris: Secondary | ICD-10-CM | POA: Diagnosis not present

## 2022-04-17 MED ORDER — ZOLPIDEM TARTRATE 5 MG PO TABS: 5.0000 mg | ORAL_TABLET | Freq: Every evening | ORAL | 0 refills | Status: DC | PRN

## 2022-04-17 MED ORDER — MELOXICAM 15 MG PO TABS: 15.0000 mg | ORAL_TABLET | Freq: Every day | ORAL | 0 refills | Status: DC

## 2022-04-17 MED ORDER — ORACEA 40 MG PO CPDR: 40.0000 mg | DELAYED_RELEASE_CAPSULE | Freq: Every day | ORAL | 6 refills | Status: DC

## 2022-04-17 NOTE — Progress Notes (Signed)
Established Patient Office Visit  Subjective   Patient ID: Robin Harrington, female    DOB: 09/22/1966  Age: 56 y.o. MRN: [id number]  Chief Complaint  Patient presents with   Medical Management of Chronic Issues    HPI Robin Harrington is here for refills on doxycyline for her acne. She takes this daily with good control. Denies side effects.   She also needs refills on meloxicam for the arthritis in her knees. She has been doing more activity recently getting ready the pickleball nationals so she has been taking it daily recently with good relief.   She also would like a refill on [place] today. She takes this as needed for situational insomnia. She struggles with sleep when she is not at home. She has an upcoming beach trip and then the trip for pickleball nationals.   Past Medical History:  Diagnosis Date   Acne    Allergy    Hyperlipidemia       ROS Negative unless specially indicated above in HPI.   Objective:     BP 116/78   Pulse 78   Temp 97.9 F (36.6 C) (Temporal)   Ht 5\' 5"  (1.651 m)   Wt 175 lb (79.4 kg)   SpO2 96%   BMI 29.12 kg/m    Physical Exam Vitals and nursing note reviewed.  Constitutional:      General: She is not in acute distress.    Appearance: She is not ill-appearing, toxic-appearing or diaphoretic.  Cardiovascular:     Rate and Rhythm: Normal rate and regular rhythm.     Pulses: Normal pulses.     Heart sounds: No murmur heard. Pulmonary:     Effort: Pulmonary effort is normal. No respiratory distress.     Breath sounds: Normal breath sounds.  Musculoskeletal:     Right knee: No swelling, deformity, erythema or ecchymosis.     Left knee: No swelling, deformity, erythema or ecchymosis.     Right lower leg: No edema.     Left lower leg: No edema.  Skin:    General: Skin is warm and dry.  Neurological:     General: No focal deficit present.     Mental Status: She is alert and oriented to person, place, and time.  Psychiatric:        Mood and  Affect: Mood normal.        Behavior: Behavior normal.        Thought Content: Thought content normal.        Judgment: Judgment normal.      No results found for any visits on 04/17/22.    The 10-year ASCVD risk score (Arnett DK, et al., 2019) is: 1.4%    Assessment & Plan:   Chauntae was seen today for medical management of chronic issues.  Diagnoses and all orders for this visit:  Acne vulgaris Chronic. Well controlled on current regimen. Refills provided.  -     doxycycline (ORACEA) 40 MG capsule; Take 1 capsule (40 mg total) by mouth daily.  Situational insomnia PDMP reviewed, no red flags. Refill provided for upcoming travel.  -     zolpidem (AMBIEN) 5 MG tablet; Take 1 tablet (5 mg total) by mouth at bedtime as needed for sleep.  Patellofemoral arthritis Chronic. Well controlled on current regimen.  -     meloxicam (MOBIC) 15 MG tablet; Take 1 tablet (15 mg total) by mouth daily.  Return in about 6 months (around 10/17/2022) for CPE.  The patient indicates understanding of these issues and agrees with the plan.  Gwenlyn Perking, FNP

## 2022-05-21 ENCOUNTER — Other Ambulatory Visit: Payer: Self-pay | Admitting: Family Medicine

## 2022-05-21 DIAGNOSIS — F5109 Other insomnia not due to a substance or known physiological condition: Secondary | ICD-10-CM

## 2022-05-21 DIAGNOSIS — E782 Mixed hyperlipidemia: Secondary | ICD-10-CM

## 2022-05-21 DIAGNOSIS — K219 Gastro-esophageal reflux disease without esophagitis: Secondary | ICD-10-CM

## 2022-06-18 ENCOUNTER — Ambulatory Visit: Payer: BC Managed Care – PPO | Admitting: Family Medicine

## 2022-06-27 ENCOUNTER — Other Ambulatory Visit: Payer: Self-pay | Admitting: Family Medicine

## 2022-06-27 DIAGNOSIS — F5109 Other insomnia not due to a substance or known physiological condition: Secondary | ICD-10-CM

## 2022-07-21 ENCOUNTER — Telehealth: Payer: Self-pay | Admitting: Family Medicine

## 2022-07-21 DIAGNOSIS — M1710 Unilateral primary osteoarthritis, unspecified knee: Secondary | ICD-10-CM

## 2022-07-21 MED ORDER — MELOXICAM 15 MG PO TABS: 15.0000 mg | ORAL_TABLET | Freq: Every day | ORAL | 0 refills | Status: DC

## 2022-07-21 NOTE — Telephone Encounter (Signed)
  Prescription Request  07/21/2022   What is the name of the medication or equipment? MELOXICAM  Have you contacted your pharmacy to request a refill? YES  Which pharmacy would you like this sent to? [facility] [place]

## 2022-07-21 NOTE — Telephone Encounter (Signed)
Pt aware refill sent to pharmacy 

## 2022-08-11 ENCOUNTER — Encounter: Payer: Self-pay | Admitting: Family Medicine

## 2022-08-11 ENCOUNTER — Ambulatory Visit: Payer: BC Managed Care – PPO | Admitting: Family Medicine

## 2022-08-11 ENCOUNTER — Other Ambulatory Visit: Payer: Self-pay | Admitting: Family Medicine

## 2022-08-11 VITALS — BP 121/60 | HR 57 | Temp 98.3°F | Ht 65.0 in | Wt 182.5 lb

## 2022-08-11 DIAGNOSIS — F419 Anxiety disorder, unspecified: Secondary | ICD-10-CM | POA: Diagnosis not present

## 2022-08-11 DIAGNOSIS — M1710 Unilateral primary osteoarthritis, unspecified knee: Secondary | ICD-10-CM

## 2022-08-11 DIAGNOSIS — K219 Gastro-esophageal reflux disease without esophagitis: Secondary | ICD-10-CM

## 2022-08-11 DIAGNOSIS — R4189 Other symptoms and signs involving cognitive functions and awareness: Secondary | ICD-10-CM

## 2022-08-11 DIAGNOSIS — R42 Dizziness and giddiness: Secondary | ICD-10-CM

## 2022-08-11 DIAGNOSIS — R0789 Other chest pain: Secondary | ICD-10-CM | POA: Diagnosis not present

## 2022-08-11 MED ORDER — PANTOPRAZOLE SODIUM 20 MG PO TBEC: 20.0000 mg | DELAYED_RELEASE_TABLET | Freq: Every day | ORAL | 1 refills | Status: DC

## 2022-08-11 NOTE — Patient Instructions (Signed)
Food Choices for Gastroesophageal Reflux Disease, Adult When you have gastroesophageal reflux disease (GERD), the foods you eat and your eating habits are very important. Choosing the right foods can help ease the discomfort of GERD. Consider working with a dietitian to help you make healthy food choices. What are tips for following this plan? Reading food labels Look for foods that are low in saturated fat. Foods that have less than 5% of daily value (DV) of fat and 0 g of trans fats may help with your symptoms. Cooking Cook foods using methods other than frying. This may include baking, steaming, grilling, or broiling. These are all methods that do not need a lot of fat for cooking. To add flavor, try to use herbs that are low in spice and acidity. Meal planning  Choose healthy foods that are low in fat, such as fruits, vegetables, whole grains, low-fat dairy products, lean meats, fish, and poultry. Eat frequent, small meals instead of three large meals each day. Eat your meals slowly, in a relaxed setting. Avoid bending over or lying down until 2-3 hours after eating. Limit high-fat foods such as fatty meats or fried foods. Limit your intake of fatty foods, such as oils, butter, and shortening. Avoid the following as told by your health care provider: Foods that cause symptoms. These may be different for different people. Keep a food diary to keep track of foods that cause symptoms. Alcohol. Drinking large amounts of liquid with meals. Eating meals during the 2-3 hours before bed. Lifestyle Maintain a healthy weight. Ask your health care provider what weight is healthy for you. If you need to lose weight, work with your health care provider to do so safely. Exercise for at least 30 minutes on 5 or more days each week, or as told by your health care provider. Avoid wearing clothes that fit tightly around your waist and chest. Do not use any products that contain nicotine or tobacco. These  products include cigarettes, chewing tobacco, and vaping devices, such as e-cigarettes. If you need help quitting, ask your health care provider. Sleep with the head of your bed raised. Use a wedge under the mattress or blocks under the bed frame to raise the head of the bed. Chew sugar-free gum after mealtimes. What foods should I eat?  Eat a healthy, well-balanced diet of fruits, vegetables, whole grains, low-fat dairy products, lean meats, fish, and poultry. Each person is different. Foods that may trigger symptoms in one person may not trigger any symptoms in another person. Work with your health care provider to identify foods that are safe for you. The items listed above may not be a complete list of recommended foods and beverages. Contact a dietitian for more information. What foods should I avoid? Limiting some of these foods may help manage the symptoms of GERD. Everyone is different. Consult a dietitian or your health care provider to help you identify the exact foods to avoid, if any. Fruits Any fruits prepared with added fat. Any fruits that cause symptoms. For some people this may include citrus fruits, such as oranges, grapefruit, pineapple, and lemons. Vegetables Deep-fried vegetables. French fries. Any vegetables prepared with added fat. Any vegetables that cause symptoms. For some people, this may include tomatoes and tomato products, chili peppers, onions and garlic, and horseradish. Grains Pastries or quick breads with added fat. Meats and other proteins High-fat meats, such as fatty beef or pork, hot dogs, ribs, ham, sausage, salami, and bacon. Fried meat or protein, including   fried fish and fried chicken. Nuts and nut butters, in large amounts. Dairy Whole milk and chocolate milk. Sour cream. Cream. Ice cream. Cream cheese. Milkshakes. Fats and oils Butter. Margarine. Shortening. Ghee. Beverages Coffee and tea, with or without caffeine. Carbonated beverages. Sodas. Energy  drinks. Fruit juice made with acidic fruits, such as orange or grapefruit. Tomato juice. Alcoholic drinks. Sweets and desserts Chocolate and cocoa. Donuts. Seasonings and condiments Pepper. Peppermint and spearmint. Added salt. Any condiments, herbs, or seasonings that cause symptoms. For some people, this may include curry, hot sauce, or vinegar-based salad dressings. The items listed above may not be a complete list of foods and beverages to avoid. Contact a dietitian for more information. Questions to ask your health care provider Diet and lifestyle changes are usually the first steps that are taken to manage symptoms of GERD. If diet and lifestyle changes do not improve your symptoms, talk with your health care provider about taking medicines. Where to find more information International Foundation for Gastrointestinal Disorders: aboutgerd.org Summary When you have gastroesophageal reflux disease (GERD), food and lifestyle choices may be very helpful in easing the discomfort of GERD. Eat frequent, small meals instead of three large meals each day. Eat your meals slowly, in a relaxed setting. Avoid bending over or lying down until 2-3 hours after eating. Limit high-fat foods such as fatty meats or fried foods. This information is not intended to replace advice given to you by your health care provider. Make sure you discuss any questions you have with your health care provider. Document Revised: 04/16/2020 Document Reviewed: 04/16/2020 Elsevier Patient Education  2023 Elsevier Inc.  

## 2022-08-11 NOTE — Progress Notes (Signed)
Acute Office Visit  Subjective:     Patient ID: Robin Harrington, female    DOB: 02/27/1966, 56 y.o.   MRN: [id number]  Chief Complaint  Patient presents with   Gastroesophageal Reflux    HPI Patient is in today for chest pain/pressure for the last few weeks. This is intermittent. She also reports episodes that feel like she can't breathe all of a sudden. Belching relieves this when it happens. These symptoms are also worse after drinking coffee or eating chocolate. She reports occasional nausea, heartburn, and epigastric pain. She sometimes has a globus sensation. Denies dysphagia or vomiting. She stopped taking prilosec for a few days because she read that prilosec can cause brain fog. She reports that she had very intense pain in her chest with eating or drinking anything when she stopped taking prilosec. She has since restarted this and symptoms have improved. She denies any chest pain or dyspnea when playing pickleball which she does regularly. Denies orthopnea, edema, visual disturbances, fatigue, or weakness. She sometimes has episodes of dizziness and palpitations. These do not last long and resolve with deep breathing. She has had increased stress lately. She currently is working and completing a Web designer. She reports brain fog and increased anxiety. She is worried about dementia. She is doing well in her program.    ROS As per HPI.      Objective:    BP 121/60   Pulse (!) 57   Temp 98.3 F (36.8 C) (Temporal)   Ht $R'5\' 5"'Lj$  (1.651 m)   Wt 182 lb 8 oz (82.8 kg)   SpO2 97%   BMI 30.37 kg/m    Physical Exam Vitals and nursing note reviewed.  Constitutional:      General: She is not in acute distress.    Appearance: She is not ill-appearing, toxic-appearing or diaphoretic.  HENT:     Head: Normocephalic and atraumatic.     Mouth/Throat:     Mouth: Mucous membranes are moist.     Pharynx: Oropharynx is clear.  Eyes:     General:        Right eye: No discharge.         Left eye: No discharge.     Conjunctiva/sclera: Conjunctivae normal.     Pupils: Pupils are equal, round, and reactive to light.  Cardiovascular:     Rate and Rhythm: Normal rate and regular rhythm.     Heart sounds: Normal heart sounds. No murmur heard. Pulmonary:     Effort: Pulmonary effort is normal.     Breath sounds: Normal breath sounds.  Abdominal:     General: Bowel sounds are normal. There is no distension.     Palpations: Abdomen is soft.     Tenderness: There is no abdominal tenderness. There is no guarding or rebound.  Musculoskeletal:     Right lower leg: No edema.     Left lower leg: No edema.  Skin:    General: Skin is warm and dry.  Neurological:     General: No focal deficit present.     Mental Status: She is alert and oriented to person, place, and time.  Psychiatric:        Mood and Affect: Mood normal.        Behavior: Behavior normal.     No results found for any visits on 08/11/22.      Assessment & Plan:   Charity was seen today for gastroesophageal reflux.  Diagnoses and all orders  for this visit:  Gastroesophageal reflux disease without esophagitis Uncontrolled. Discussed history is consistent with uncontrolled GERD, not cardiac. Switch from omeprazole to protonix. Pepcid BID prn. Discussed diet.  -   pantoprazole (PROTONIX) 20 MG tablet; Take 1 tablet (20 mg total) by mouth daily.  Atypical chest pain EKG with sinus rhythm. Labs pending as below. Discussed GERD and/or anxiety as etiology.  -     Anemia Profile B -     TSH -     CMP14+EGFR -     EKG 12-Lead  Dizziness Labs pending. Discussed likely due to anxiety.  -     Anemia Profile B -     TSH -     CMP14+EGFR -     EKG 12-Lead  Anxiety Uncontrolled. She declined medications or referral today.  -     Anemia Profile B -     TSH -     CMP14+EGFR  Brain fog Discussed uncontrolled anxiety as etiology.     Return in about 4 weeks (around 09/08/2022) for GERD follow up. Sooner  for new or worsening symptoms.   Gwenlyn Perking, FNP

## 2022-08-12 LAB — CMP14+EGFR
ALT: 13 IU/L (ref 0–32)
AST: 19 IU/L (ref 0–40)
Albumin/Globulin Ratio: 2.5 — ABNORMAL HIGH (ref 1.2–2.2)
Albumin: 4.9 g/dL (ref 3.8–4.9)
Alkaline Phosphatase: 55 IU/L (ref 44–121)
BUN/Creatinine Ratio: 21 (ref 9–23)
BUN: 16 mg/dL (ref 6–24)
Bilirubin Total: <0.2 mg/dL (ref 0.0–1.2)
CO2: 25 mmol/L (ref 20–29)
Calcium: 9.7 mg/dL (ref 8.7–10.2)
Chloride: 102 mmol/L (ref 96–106)
Creatinine, Ser: 0.77 mg/dL (ref 0.57–1.00)
Globulin, Total: 2 g/dL (ref 1.5–4.5)
Glucose: 84 mg/dL (ref 70–99)
Potassium: 4.2 mmol/L (ref 3.5–5.2)
Sodium: 139 mmol/L (ref 134–144)
Total Protein: 6.9 g/dL (ref 6.0–8.5)
eGFR: 90 mL/min/{1.73_m2} (ref >59)

## 2022-08-12 LAB — ANEMIA PROFILE B
Basophils Absolute: 0 10*3/uL (ref 0.0–0.2)
Basos: 1 %
EOS (ABSOLUTE): 0.1 10*3/uL (ref 0.0–0.4)
Eos: 2 %
Ferritin: 65 ng/mL (ref 15–150)
Folate: 8.8 ng/mL (ref >3.0)
Hematocrit: 40.3 % (ref 34.0–46.6)
Hemoglobin: 13.2 g/dL (ref 11.1–15.9)
Immature Grans (Abs): 0 10*3/uL (ref 0.0–0.1)
Immature Granulocytes: 0 %
Iron Saturation: 23 % (ref 15–55)
Iron: 66 ug/dL (ref 27–159)
Lymphocytes Absolute: 1.8 10*3/uL (ref 0.7–3.1)
Lymphs: 34 %
MCH: 26.6 pg (ref 26.6–33.0)
MCHC: 32.8 g/dL (ref 31.5–35.7)
MCV: 81 fL (ref 79–97)
Monocytes Absolute: 0.5 10*3/uL (ref 0.1–0.9)
Monocytes: 9 %
Neutrophils Absolute: 3 10*3/uL (ref 1.4–7.0)
Neutrophils: 54 %
Platelets: 281 10*3/uL (ref 150–450)
RBC: 4.97 x10E6/uL (ref 3.77–5.28)
RDW: 13.2 % (ref 11.7–15.4)
Retic Ct Pct: 1.8 % (ref 0.6–2.6)
Total Iron Binding Capacity: 289 ug/dL (ref 250–450)
UIBC: 223 ug/dL (ref 131–425)
Vitamin B-12: 580 pg/mL (ref 232–1245)
WBC: 5.5 10*3/uL (ref 3.4–10.8)

## 2022-08-12 LAB — TSH: TSH: 2.29 u[IU]/mL (ref 0.450–4.500)

## 2022-08-14 ENCOUNTER — Encounter: Payer: Self-pay | Admitting: Family Medicine

## 2022-08-14 ENCOUNTER — Other Ambulatory Visit: Payer: Self-pay | Admitting: Family Medicine

## 2022-08-14 DIAGNOSIS — K219 Gastro-esophageal reflux disease without esophagitis: Secondary | ICD-10-CM

## 2022-08-14 MED ORDER — PANTOPRAZOLE SODIUM 40 MG PO TBEC: 40.0000 mg | DELAYED_RELEASE_TABLET | Freq: Every day | ORAL | 1 refills | Status: DC

## 2022-09-05 ENCOUNTER — Encounter: Payer: Self-pay | Admitting: Family Medicine

## 2022-09-05 ENCOUNTER — Ambulatory Visit: Payer: BC Managed Care – PPO | Admitting: Family Medicine

## 2022-09-05 VITALS — BP 116/60 | HR 63 | Temp 98.2°F | Ht 65.0 in | Wt 180.4 lb

## 2022-09-05 DIAGNOSIS — F419 Anxiety disorder, unspecified: Secondary | ICD-10-CM | POA: Diagnosis not present

## 2022-09-05 DIAGNOSIS — J0140 Acute pansinusitis, unspecified: Secondary | ICD-10-CM | POA: Diagnosis not present

## 2022-09-05 DIAGNOSIS — K219 Gastro-esophageal reflux disease without esophagitis: Secondary | ICD-10-CM | POA: Diagnosis not present

## 2022-09-05 MED ORDER — AMOXICILLIN-POT CLAVULANATE 875-125 MG PO TABS: 1.0000 | ORAL_TABLET | Freq: Two times a day (BID) | ORAL | 0 refills | Status: AC

## 2022-09-05 MED ORDER — CHLORPHEN-PE-ACETAMINOPHEN 4-10-325 MG PO TABS: 1.0000 | ORAL_TABLET | Freq: Four times a day (QID) | ORAL | 0 refills | Status: DC | PRN

## 2022-09-05 NOTE — Progress Notes (Signed)
Established Patient Office Visit  Subjective   Patient ID: Robin Harrington, female    DOB: [birth year]  Age: 56 y.o. MRN: [id number]  Chief Complaint  Patient presents with   Medical Management of Chronic Issues   Gastroesophageal Reflux    Gastroesophageal Reflux   GERD Compliant with medications - Yes Current medications - protonix 40 mg daily Voice change - No Hemoptysis - No Dysphagia or dyspepsia - No Water brash - No Red Flags (weight loss, hematochezia, melena, weight loss, early satiety, fevers, odynophagia, or persistent vomiting) - No  2. URI She reports cough congestion, postnasal drip, sore throat for 10 days. She has a HA with pressure around her eyes and cheeks. Symptoms have been unchanged.  Cough is dry. She denies fever, shortness of breath, or chest pain. She does report chills. She has not been taking anything for her symptoms.   3. Anxiety She reports that anxiety is now manageable.      09/05/2022    4:26 PM 04/17/2022    4:57 PM 01/17/2022    8:20 AM  Depression screen PHQ 2/9  Decreased Interest 0 0 0  Down, Depressed, Hopeless 1 0 0  PHQ - 2 Score 1 0 0  Altered sleeping 1 0 2  Tired, decreased energy 1 0 1  Change in appetite 1 2 0  Feeling bad or failure about yourself  0 0 0  Trouble concentrating 0 0 0  Moving slowly or fidgety/restless 0 0 0  Suicidal thoughts 0 0 0  PHQ-9 Score 4 2 3   Difficult doing work/chores Not difficult at all Somewhat difficult Not difficult at all      09/05/2022    4:27 PM 04/17/2022    4:58 PM 01/17/2022    8:21 AM 10/09/2021    8:40 AM  GAD 7 : Generalized Anxiety Score  Nervous, Anxious, on Edge 1 0 1 1  Control/stop worrying 1 1 1 2   Worry too much - different things 1 1 1 2   Trouble relaxing 1 1 1 2   Restless 0 1 0 0  Easily annoyed or irritable 0 0 1 0  Afraid - awful might happen 1 0 1 0  Total GAD 7 Score 5 4 6 7   Anxiety Difficulty Not difficult at all Not difficult at all Not difficult at all Not  difficult at all     Past Medical History:  Diagnosis Date   Acne    Allergy    Hyperlipidemia       ROS As per HPI.    Objective:     BP 116/60   Pulse 63   Temp 98.2 F (36.8 C) (Temporal)   Ht 5\' 5"  (1.651 m)   Wt 180 lb 6 oz (81.8 kg)   SpO2 97%   BMI 30.02 kg/m  Wt Readings from Last 3 Encounters:  09/05/22 180 lb 6 oz (81.8 kg)  08/11/22 182 lb 8 oz (82.8 kg)  04/17/22 175 lb (79.4 kg)      Physical Exam Vitals and nursing note reviewed.  Constitutional:      General: She is not in acute distress.    Appearance: She is ill-appearing. She is not toxic-appearing or diaphoretic.  HENT:     Head: Normocephalic and atraumatic.     Right Ear: Tympanic membrane, ear canal and external ear normal.     Left Ear: Tympanic membrane, ear canal and external ear normal.     Nose: Congestion present.  Right Sinus: Maxillary sinus tenderness and frontal sinus tenderness present.     Left Sinus: Maxillary sinus tenderness and frontal sinus tenderness present.     Mouth/Throat:     Mouth: Mucous membranes are moist.     Pharynx: Posterior oropharyngeal erythema present. No pharyngeal swelling or uvula swelling.     Tonsils: No tonsillar exudate or tonsillar abscesses. 0 on the right. 0 on the left.  Eyes:     General:        Right eye: No discharge.        Left eye: No discharge.     Conjunctiva/sclera: Conjunctivae normal.  Cardiovascular:     Rate and Rhythm: Normal rate and regular rhythm.     Heart sounds: Normal heart sounds. No murmur heard. Pulmonary:     Effort: Pulmonary effort is normal. No respiratory distress.     Breath sounds: Normal breath sounds.  Abdominal:     General: Bowel sounds are normal. There is no distension.     Palpations: Abdomen is soft.     Tenderness: There is no abdominal tenderness. There is no guarding or rebound.  Musculoskeletal:     Cervical back: Neck supple. No rigidity.     Right lower leg: No edema.     Left lower  leg: No edema.  Skin:    General: Skin is warm and dry.  Neurological:     General: No focal deficit present.     Mental Status: She is alert and oriented to person, place, and time.  Psychiatric:        Mood and Affect: Mood normal.        Behavior: Behavior normal.        Thought Content: Thought content normal.        Judgment: Judgment normal.      No results found for any visits on 09/05/22.    The 10-year ASCVD risk score (Arnett DK, et al., 2019) is: 1.4%    Assessment & Plan:   Cyla was seen today for medical management of chronic issues and gastroesophageal reflux.  Diagnoses and all orders for this visit:  Gastroesophageal reflux disease without esophagitis Well controlled on current regimen. Continue protonix.   Anxiety Improved. Reports now manageable.   Acute non-recurrent pansinusitis Augmentin as below. Hold doxycyline while on augmentin. Norel AD prn. Discussed symptomatic care and return precautions.  -     amoxicillin-clavulanate (AUGMENTIN) 875-125 MG tablet; Take 1 tablet by mouth 2 (two) times daily for 7 days. -     Chlorphen-PE-Acetaminophen 4-10-325 MG TABS; Take 1 tablet by mouth every 6 (six) hours as needed.  Return in about 3 months (around 12/06/2022) for chronic follow up.   The patient indicates understanding of these issues and agrees with the plan.  [name], FNP

## 2022-09-05 NOTE — Patient Instructions (Signed)

## 2022-09-08 ENCOUNTER — Telehealth: Payer: Self-pay | Admitting: Family Medicine

## 2022-09-08 DIAGNOSIS — J0140 Acute pansinusitis, unspecified: Secondary | ICD-10-CM

## 2022-09-08 MED ORDER — PREDNISONE 10 MG (21) PO TBPK: ORAL_TABLET | ORAL | 0 refills | Status: DC

## 2022-09-08 NOTE — Telephone Encounter (Signed)
Patient is calling back because her sinus infection is no better. She was seen 11/17 for this and she said the over the counter medication is helping the cough but she does not feel like the infection is going away. Wants to know if anything else can be called in or what else she can do about it. Please call back and advise.

## 2022-09-08 NOTE — Telephone Encounter (Signed)
Pt aware of provider feedback and voiced understanding. 

## 2022-09-08 NOTE — Telephone Encounter (Signed)
I've sent in a prednisone pack. She can also try flonase OTC.

## 2022-09-10 ENCOUNTER — Ambulatory Visit: Payer: BC Managed Care – PPO | Admitting: Family Medicine

## 2022-09-17 ENCOUNTER — Other Ambulatory Visit: Payer: Self-pay | Admitting: *Deleted

## 2022-09-17 ENCOUNTER — Telehealth: Payer: BC Managed Care – PPO | Admitting: Family Medicine

## 2022-09-17 ENCOUNTER — Encounter: Payer: Self-pay | Admitting: Family Medicine

## 2022-09-17 DIAGNOSIS — J069 Acute upper respiratory infection, unspecified: Secondary | ICD-10-CM

## 2022-09-17 DIAGNOSIS — E782 Mixed hyperlipidemia: Secondary | ICD-10-CM

## 2022-09-17 MED ORDER — ROSUVASTATIN CALCIUM 40 MG PO TABS: 40.0000 mg | ORAL_TABLET | Freq: Every day | ORAL | 0 refills | Status: DC

## 2022-09-17 MED ORDER — DOXYCYCLINE HYCLATE 100 MG PO TABS: 100.0000 mg | ORAL_TABLET | Freq: Two times a day (BID) | ORAL | 0 refills | Status: AC

## 2022-09-17 MED ORDER — GUAIFENESIN ER 600 MG PO TB12: 600.0000 mg | ORAL_TABLET | Freq: Two times a day (BID) | ORAL | 0 refills | Status: AC

## 2022-09-17 MED ORDER — ALBUTEROL SULFATE HFA 108 (90 BASE) MCG/ACT IN AERS: 2.0000 | INHALATION_SPRAY | Freq: Four times a day (QID) | RESPIRATORY_TRACT | 2 refills | Status: DC | PRN

## 2022-09-17 NOTE — Progress Notes (Signed)
Virtual Visit via MyChart Video Note Due to COVID-19 pandemic this visit was conducted virtually. This visit type was conducted due to national recommendations for restrictions regarding the COVID-19 Pandemic (e.g. social distancing, sheltering in place) in an effort to limit this patient's exposure and mitigate transmission in our community. All issues noted in this document were discussed and addressed.  A physical exam was not performed with this format.   I connected with Robin Harrington on 09/17/2022 at 1315 by MyChart Video and verified that I am speaking with the correct person using two identifiers. Robin Harrington is currently located at home and patient is currently with them during visit. The provider, Kari Baars, FNP is located in their office at time of visit.  I discussed the limitations, risks, security and privacy concerns of performing an evaluation and management service by virtual visit and the availability of in person appointments. I also discussed with the patient that there may be a patient responsible charge related to this service. The patient expressed understanding and agreed to proceed.  Subjective:  Patient ID: Robin Harrington Clear, female    DOB: [birth year], 56 y.o.   MRN: [id number]  Chief Complaint:  URI   HPI: Robin Harrington is a 56 y.o. female presenting on 09/17/2022 for URI   Pt reports ongoing URI symptoms for over 2 weeks. Worsening over the last week. Was recently in [place] for vacation and symptoms worsened after returning home. Has been treating symptomatically without relief of symptoms.   URI  This is a recurrent problem. Episode onset: 2.5 weeks ago. The problem has been gradually worsening. Associated symptoms include congestion, coughing, ear pain, headaches, rhinorrhea and a sore throat. Pertinent negatives include no abdominal pain, chest pain, diarrhea, dysuria, joint pain, joint swelling, nausea, neck pain, plugged ear sensation, rash, sinus pain,  sneezing, swollen glands, vomiting or wheezing. She has tried decongestant, antihistamine and acetaminophen for the symptoms. The treatment provided no relief.     Relevant past medical, surgical, family, and social history reviewed and updated as indicated.  Allergies and medications reviewed and updated.   Past Medical History:  Diagnosis Date   Acne    Allergy    Hyperlipidemia     History reviewed. No pertinent surgical history.  Social History   Socioeconomic History   Marital status: Married    Spouse name: Not on file   Number of children: Not on file   Years of education: Not on file   Highest education level: Not on file  Occupational History   Not on file  Tobacco Use   Smoking status: Never   Smokeless tobacco: Never  Vaping Use   Vaping Use: Never used  Substance and Sexual Activity   Alcohol use: No   Drug use: No   Sexual activity: Not on file  Other Topics Concern   Not on file  Social History Narrative   Not on file   Social Determinants of Health   Financial Resource Strain: Not on file  Food Insecurity: Not on file  Transportation Needs: Not on file  Physical Activity: Not on file  Stress: Not on file  Social Connections: Not on file  Intimate Partner Violence: Not on file    Outpatient Encounter Medications as of 09/17/2022  Medication Sig   albuterol (VENTOLIN HFA) 108 (90 Base) MCG/ACT inhaler Inhale 2 puffs into the lungs every 6 (six) hours as needed for wheezing or shortness of breath.   doxycycline (VIBRA-TABS) 100  MG tablet Take 1 tablet (100 mg total) by mouth 2 (two) times daily for 10 days. 1 po bid   guaiFENesin (MUCINEX) 600 MG 12 hr tablet Take 1 tablet (600 mg total) by mouth 2 (two) times daily for 10 days.   Chlorphen-PE-Acetaminophen 4-10-325 MG TABS Take 1 tablet by mouth every 6 (six) hours as needed.   doxycycline (ORACEA) 40 MG capsule Take 1 capsule (40 mg total) by mouth daily.   fluticasone (FLONASE) 50 MCG/ACT  nasal spray Place 2 sprays into both nostrils daily.   meloxicam (MOBIC) 15 MG tablet Take 1 tablet (15 mg total) by mouth daily.   pantoprazole (PROTONIX) 40 MG tablet Take 1 tablet (40 mg total) by mouth daily.   predniSONE (STERAPRED UNI-PAK 21 TAB) 10 MG (21) TBPK tablet Use as directed on back of pill pack   rosuvastatin (CRESTOR) 40 MG tablet TAKE ONE TABLET BY MOUTH DAILY   valACYclovir (VALTREX) 1000 MG tablet Take 1 tablet (1,000 mg total) by mouth daily.   zolpidem (AMBIEN) 5 MG tablet TAKE ONE TABLET BY MOUTH AT BEDTIME AS NEEDED FOR SLEEP   No facility-administered encounter medications on file as of 09/17/2022.    No Known Allergies  Review of Systems  Constitutional:  Positive for activity change, appetite change, chills and fatigue. Negative for diaphoresis, fever and unexpected weight change.  HENT:  Positive for congestion, ear pain, postnasal drip, rhinorrhea and sore throat. Negative for dental problem, drooling, ear discharge, facial swelling, hearing loss, mouth sores, nosebleeds, sinus pressure, sinus pain, sneezing, tinnitus, trouble swallowing and voice change.   Eyes:  Negative for photophobia and visual disturbance.  Respiratory:  Positive for cough. Negative for apnea, choking, chest tightness, shortness of breath, wheezing and stridor.   Cardiovascular:  Negative for chest pain, palpitations and leg swelling.  Gastrointestinal:  Negative for abdominal distention, abdominal pain, diarrhea, nausea and vomiting.  Endocrine: Negative for polydipsia, polyphagia and polyuria.  Genitourinary:  Negative for decreased urine volume, difficulty urinating and dysuria.  Musculoskeletal:  Negative for joint pain and neck pain.  Skin:  Negative for rash.  Neurological:  Positive for headaches. Negative for dizziness, tremors, seizures, syncope, facial asymmetry, speech difficulty, weakness, light-headedness and numbness.  Psychiatric/Behavioral:  Negative for confusion.   All  other systems reviewed and are negative.        Observations/Objective: No vital signs or physical exam, this was a virtual health encounter.  Pt alert and oriented, answers all questions appropriately, and able to speak in full sentences.    Assessment and Plan: Lynleigh was seen today for uri.  Diagnoses and all orders for this visit:  URI with cough and congestion Ongoing and worsening symptoms despite symptomatic care at home. Will add below to regimen. Report new, worsening, or persistent symptoms.  -     guaiFENesin (MUCINEX) 600 MG 12 hr tablet; Take 1 tablet (600 mg total) by mouth 2 (two) times daily for 10 days. -     doxycycline (VIBRA-TABS) 100 MG tablet; Take 1 tablet (100 mg total) by mouth 2 (two) times daily for 10 days. 1 po bid -     albuterol (VENTOLIN HFA) 108 (90 Base) MCG/ACT inhaler; Inhale 2 puffs into the lungs every 6 (six) hours as needed for wheezing or shortness of breath.     Follow Up Instructions: Return if symptoms worsen or fail to improve.    I discussed the assessment and treatment plan with the patient. The patient was provided an opportunity  to ask questions and all were answered. The patient agreed with the plan and demonstrated an understanding of the instructions.   The patient was advised to call back or seek an in-person evaluation if the symptoms worsen or if the condition fails to improve as anticipated.  The above assessment and management plan was discussed with the patient. The patient verbalized understanding of and has agreed to the management plan. Patient is aware to call the clinic if they develop any new symptoms or if symptoms persist or worsen. Patient is aware when to return to the clinic for a follow-up visit. Patient educated on when it is appropriate to go to the emergency department.    I provided 15 minutes of time during this MyChart Video encounter.   Kari Baars, FNP-C [facility] [address] [place], [place] [address] [phone] 09/17/2022

## 2022-09-18 ENCOUNTER — Telehealth: Payer: BC Managed Care – PPO

## 2022-10-01 ENCOUNTER — Telehealth: Payer: Self-pay | Admitting: Family Medicine

## 2022-10-01 DIAGNOSIS — R55 Syncope and collapse: Secondary | ICD-10-CM

## 2022-10-01 NOTE — Telephone Encounter (Deleted)
REFERRAL REQUEST Telephone Note  Have you been seen at our office for this problem? YES PT has seen Tiffany for these issues before, she has gone to eye doctor (Advise that they may need an appointment with their PCP before a referral can be done)  Reason for Referral: vision goes dark, eye dr told pt she should have carotid artery ultrasound Referral discussed with patient: yes  Best contact number of patient for referral team: [phone]    Has patient been seen by a specialist for this issue before: pt has seen eye dr  Patient provider preference for referral: no Patient location preference for referral: no    Patient notified that referrals can take up to a week or longer to process. If they haven't heard anything within a week they should call back and speak with the referral department.

## 2022-10-02 ENCOUNTER — Other Ambulatory Visit: Payer: Self-pay | Admitting: Family Medicine

## 2022-10-02 NOTE — Telephone Encounter (Signed)
Carotid US ordered. 

## 2022-10-02 NOTE — Telephone Encounter (Signed)
Left VM stating Carotid Artery ultrasound ordered and no need to come to appt which has been cancelled and to call back with any questions or concerns.

## 2022-10-03 ENCOUNTER — Ambulatory Visit: Payer: BC Managed Care – PPO | Admitting: Family Medicine

## 2022-10-22 ENCOUNTER — Encounter: Payer: BC Managed Care – PPO | Admitting: Family Medicine

## 2022-12-11 ENCOUNTER — Other Ambulatory Visit: Payer: Self-pay | Admitting: *Deleted

## 2022-12-11 DIAGNOSIS — L700 Acne vulgaris: Secondary | ICD-10-CM

## 2022-12-11 DIAGNOSIS — K219 Gastro-esophageal reflux disease without esophagitis: Secondary | ICD-10-CM

## 2022-12-11 DIAGNOSIS — M1710 Unilateral primary osteoarthritis, unspecified knee: Secondary | ICD-10-CM

## 2022-12-11 MED ORDER — ORACEA 40 MG PO CPDR: 40.0000 mg | DELAYED_RELEASE_CAPSULE | Freq: Every day | ORAL | 1 refills | Status: DC

## 2022-12-11 MED ORDER — MELOXICAM 15 MG PO TABS: 15.0000 mg | ORAL_TABLET | Freq: Every day | ORAL | 0 refills | Status: DC

## 2022-12-11 MED ORDER — PANTOPRAZOLE SODIUM 40 MG PO TBEC: 40.0000 mg | DELAYED_RELEASE_TABLET | Freq: Every day | ORAL | 0 refills | Status: DC

## 2022-12-29 ENCOUNTER — Ambulatory Visit (INDEPENDENT_AMBULATORY_CARE_PROVIDER_SITE_OTHER): Payer: BC Managed Care – PPO | Admitting: Nurse Practitioner

## 2022-12-29 ENCOUNTER — Encounter: Payer: Self-pay | Admitting: Nurse Practitioner

## 2022-12-29 VITALS — BP 109/66 | HR 103 | Temp 97.6°F | Resp 20 | Ht 65.0 in | Wt 176.0 lb

## 2022-12-29 DIAGNOSIS — R300 Dysuria: Secondary | ICD-10-CM

## 2022-12-29 LAB — URINALYSIS, COMPLETE
Bilirubin, UA: NEGATIVE
Glucose, UA: NEGATIVE
Ketones, UA: NEGATIVE
Leukocytes,UA: NEGATIVE
Nitrite, UA: NEGATIVE
Protein,UA: NEGATIVE
RBC, UA: NEGATIVE
Specific Gravity, UA: 1.015 (ref 1.005–1.030)
Urobilinogen, Ur: 0.2 mg/dL (ref 0.2–1.0)
pH, UA: 6 (ref 5.0–7.5)

## 2022-12-29 LAB — MICROSCOPIC EXAMINATION
Bacteria, UA: NONE SEEN
Epithelial Cells (non renal): NONE SEEN /hpf (ref 0–10)
RBC, Urine: NONE SEEN /hpf (ref 0–2)
Renal Epithel, UA: NONE SEEN /hpf
WBC, UA: NONE SEEN /hpf (ref 0–5)

## 2022-12-29 MED ORDER — DOXYCYCLINE HYCLATE 100 MG PO TABS: 100.0000 mg | ORAL_TABLET | Freq: Two times a day (BID) | ORAL | 0 refills | Status: DC

## 2022-12-29 NOTE — Patient Instructions (Signed)
Take medication as prescribe Cotton underwear Take shower not bath Cranberry juice, yogurt Force fluids AZO over the counter X2 days Culture pending RTO prn  

## 2022-12-29 NOTE — Progress Notes (Signed)
Subjective:    Patient ID: Robin Harrington, female    DOB: 22-Feb-1966, 57 y.o.   MRN: [id number]   Chief Complaint: dysuria  Dysuria  This is a new problem. The current episode started yesterday. The problem occurs every urination. The quality of the pain is described as burning. The pain is at a severity of 3/10. The pain is mild. There has been no fever. She is Sexually active. There is No history of pyelonephritis. Associated symptoms include flank pain, hematuria and urgency. Treatments tried: had 2 dosesof doxycycline at home so she has taken  those. The treatment provided mild relief. Her past medical history is significant for recurrent UTIs.    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   Respiratory: Negative.    Cardiovascular: Negative.   Genitourinary:  Positive for dysuria, flank pain, hematuria and urgency.  Neurological: Negative.   Psychiatric/Behavioral: Negative.         Objective:   Physical Exam Constitutional:      Appearance: Normal appearance.  Cardiovascular:     Rate and Rhythm: Normal rate and regular rhythm.     Heart sounds: Normal heart sounds.  Pulmonary:     Effort: Pulmonary effort is normal.     Breath sounds: Normal breath sounds.  Abdominal:     Tenderness: There is abdominal tenderness (suprapubic pressure). There is no right CVA tenderness or left CVA tenderness.  Skin:    General: Skin is warm.  Neurological:     General: No focal deficit present.      Mental Status: She is alert and oriented to person, place, and time.  Psychiatric:        Mood and Affect: Mood normal.        Behavior: Behavior normal.     BP 109/66   Pulse (!) 103   Temp 97.6 F (36.4 C) (Temporal)   Resp 20   Ht '5\' 5"'$  (1.651 m)   Wt 176 lb (79.8 kg)   SpO2 98%   BMI 29.29 kg/m        Assessment & Plan:   Robin Harrington in today with chief complaint of Dysuria   1. Dysuria Urine negative which may be due to the doxycycline she took at home Will finish full dose of doxycycline Take medication as prescribe Cotton underwear Take shower not bath Cranberry juice, yogurt Force fluids AZO over the counter X2 days Culture pending RTO prn  - Urinalysis, Complete - Urine Culture  Meds ordered this encounter  Medications   doxycycline (VIBRA-TABS) 100 MG tablet    Sig: Take 1 tablet (100 mg total) by mouth 2 (two) times daily. 1 po bid    Dispense:  20 tablet    Refill:  0    Order Specific Question:   Supervising Provider    Answer:   Caryl Pina A [id number]     The above assessment and management plan was discussed with the patient. The patient verbalized understanding of and has agreed to  the management plan. Patient is aware to call the clinic if symptoms persist or worsen. Patient is aware when to return to the clinic for a follow-up visit. Patient educated on when it is appropriate to go to the emergency department.   Mary-Margaret Hassell Done, FNP

## 2022-12-31 LAB — URINE CULTURE

## 2023-01-14 ENCOUNTER — Telehealth: Payer: Self-pay

## 2023-01-14 NOTE — Telephone Encounter (Signed)
Attempted to do PA on [employer] and this was result    Message from [employer] Your PA has been resolved, no additional PA is required. For further inquiries please contact the number on the back of the member prescription card. (Message [phone]

## 2023-01-22 ENCOUNTER — Telehealth: Payer: Self-pay | Admitting: Family Medicine

## 2023-01-22 NOTE — Telephone Encounter (Signed)
Spoke with patient, rescheduled for 03/18/23 at 9:20 am with Marjorie Smolder.

## 2023-01-23 ENCOUNTER — Encounter: Payer: BC Managed Care – PPO | Admitting: Family Medicine

## 2023-02-18 ENCOUNTER — Other Ambulatory Visit: Payer: Self-pay | Admitting: Family Medicine

## 2023-02-18 DIAGNOSIS — F5109 Other insomnia not due to a substance or known physiological condition: Secondary | ICD-10-CM

## 2023-03-04 NOTE — Progress Notes (Signed)
[facility] [address] [place] [address] Phone: [phone] Subjective:   Robin Harrington, am serving as a scribe for Dr. Antoine Primas.  I'm seeing this patient by the request  of:  [name], FNP  CC: Right foot pain  BJY:NWGNFAOZHY  Robin Harrington is a 57 y.o. female coming in with complaint of R foot pain. Last seen for knee pain in June 2023. Patient states that 3 weeks ago she developed a knot on top of midfoot. Painful in this area. Pain worse at end of day and when shoe is pressing on this area. Patient has had this before but it goes away.        Past Medical History:  Diagnosis Date   Acne    Allergy    Hyperlipidemia    No past surgical history on file. Social History   Socioeconomic History   Marital status: Married    Spouse name: Not on file   Number of children: Not on file   Years of education: Not on file   Highest education level: Not on file  Occupational History   Not on file  Tobacco Use   Smoking status: Never   Smokeless tobacco: Never  Vaping Use   Vaping Use: Never used  Substance and Sexual Activity   Alcohol use: No   Drug use: No   Sexual activity: Not on file  Other Topics Concern   Not on file  Social History Narrative   Not on file   Social Determinants of Health   Financial Resource Strain: Not on file  Food Insecurity: Not on file  Transportation Needs: Not on file  Physical Activity: Not on file  Stress: Not on file  Social Connections: Not on file   No Known Allergies Family History  Problem Relation Age of Onset   Hypertension Mother    Atrial fibrillation Mother    Cancer Mother        melanoma    Cancer Father        colon    Heart disease Father    Breast cancer Neg Hx      Current Outpatient Medications (Cardiovascular):    rosuvastatin (CRESTOR) 40 MG tablet, Take 1 tablet (40 mg total) by mouth daily.  Current Outpatient Medications (Respiratory):     albuterol (VENTOLIN HFA) 108 (90 Base) MCG/ACT inhaler, Inhale 2 puffs into the lungs every 6 (six) hours as needed for wheezing or shortness of breath.   Chlorphen-PE-Acetaminophen 4-10-325 MG TABS, Take 1 tablet by mouth every 6 (six) hours as needed.   fluticasone (FLONASE) 50 MCG/ACT nasal spray, Place 2 sprays into both nostrils daily.  Current Outpatient Medications (Analgesics):    meloxicam (MOBIC) 15 MG tablet, Take 1 tablet (15 mg total) by mouth daily.   Current Outpatient Medications (Other):    doxycycline (VIBRA-TABS) 100 MG tablet, Take 1 tablet (100 mg total) by mouth 2 (two) times daily. 1 po bid   Doxycycline, Rosacea, (ORACEA) 40 MG CPDR, Take 40 mg by mouth daily.   pantoprazole (PROTONIX) 40 MG tablet, Take 1 tablet (40 mg total) by mouth daily.   valACYclovir (VALTREX) 1000 MG tablet, Take 1 tablet (1,000 mg total) by mouth daily.   zolpidem (AMBIEN) 5 MG tablet, TAKE ONE TABLET BY MOUTH AT BEDTIME AS NEEDED FOR SLEEP   Reviewed prior external information including notes and imaging from  primary care provider As well as notes that were available from  care everywhere and other healthcare systems.  Past medical history, social, surgical and family history all reviewed in electronic medical record.  No pertanent information unless stated regarding to the chief complaint.   Review of Systems:  No headache, visual changes, nausea, vomiting, diarrhea, constipation, dizziness, abdominal pain, skin rash, fevers, chills, night sweats, weight loss, swollen lymph nodes, body aches, joint swelling, chest pain, shortness of breath, mood changes. POSITIVE muscle aches  Objective  Blood pressure 110/72, pulse 64, height 5\' 5"  (1.651 m), weight 176 lb (79.8 kg), SpO2 98 %.   General: No apparent distress alert and oriented x3 mood and affect normal, dressed appropriately.  HEENT: Pupils equal, extraocular movements intact  Respiratory: Patient's speak in full sentences and does  not appear short of breath  Cardiovascular: No lower extremity edema, non tender, no erythema  Right foot exam over the midfoot does have some swelling noted.  Tender to palpation in this area.  Seems to be closer to the second and third metatarsal proximally.  Limited muscular skeletal ultrasound was performed and interpreted by Antoine Primas, M  Limited ultrasound shows the patient does have a cyst formation noted and hypoechoic changes noted with mild loculation.  No abnormal vascularity.  Moderate underlying arthritic changes of the midfoot. 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  Verbal informed consent obtained.  Time-out conducted.  Noted no overlying erythema, induration, or other signs of local infection.  Skin prepped in a sterile fashion.  Local anesthesia: Topical Ethyl chloride.  With sterile technique and under real time ultrasound guidance: With a 25-gauge half inch needle injected with 0.5 cc of 0.5% Marcaine and 0.5 cc of Kenalog 40 mg/mL Completed without difficulty  Pain immediately resolved suggesting accurate placement of the medication.  Advised to call if fevers/chills, erythema, induration, drainage, or persistent bleeding.  Impression: Technically successful ultrasound guided injection.    Impression and Recommendations:    The above documentation has been reviewed and is accurate and complete Judi Saa, DO

## 2023-03-05 ENCOUNTER — Ambulatory Visit: Payer: BC Managed Care – PPO | Admitting: Family Medicine

## 2023-03-05 ENCOUNTER — Encounter: Payer: Self-pay | Admitting: Family Medicine

## 2023-03-05 ENCOUNTER — Ambulatory Visit (INDEPENDENT_AMBULATORY_CARE_PROVIDER_SITE_OTHER): Payer: BC Managed Care – PPO

## 2023-03-05 ENCOUNTER — Other Ambulatory Visit: Payer: Self-pay

## 2023-03-05 VITALS — BP 110/72 | HR 64 | Ht 65.0 in | Wt 176.0 lb

## 2023-03-05 DIAGNOSIS — M79672 Pain in left foot: Secondary | ICD-10-CM | POA: Diagnosis not present

## 2023-03-05 DIAGNOSIS — M19072 Primary osteoarthritis, left ankle and foot: Secondary | ICD-10-CM | POA: Diagnosis not present

## 2023-03-05 DIAGNOSIS — M79671 Pain in right foot: Secondary | ICD-10-CM

## 2023-03-05 DIAGNOSIS — M71372 Other bursal cyst, left ankle and foot: Secondary | ICD-10-CM

## 2023-03-05 NOTE — Assessment & Plan Note (Addendum)
Injection given today and tolerated the procedure well, discussed icing regimen and home exercises.  Discussed the arthritic changes of the midfoot that is also contributing at this time.  Increase activity slowly otherwise.  Follow-up with me again 6 to 8 weeks discussed proper shoes, topical anti-inflammatories.

## 2023-03-05 NOTE — Assessment & Plan Note (Signed)
Midfoot arthritis noted.  Discussed icing regimen and home exercises, discussed which activities to do and which ones to avoid.  Follow-up again in 6 to 8 weeks

## 2023-03-05 NOTE — Patient Instructions (Signed)
HOKA or Gravity Defyer shoes Carbon Fiber plate Vit D 1610RU Diet higher in Omega 3s than Omega 6s Xray R foot on way out See me again in 5 weeks

## 2023-03-11 IMAGING — MG MM DIGITAL SCREENING BILAT W/ TOMO AND CAD
8 series · 9 of 24 positions shown · non-contrast
Comparison: Previous exam(s).

CLINICAL DATA: Screening.

EXAM:
DIGITAL SCREENING BILATERAL MAMMOGRAM WITH TOMOSYNTHESIS AND CAD
TECHNIQUE: Bilateral screening digital craniocaudal and mediolateral oblique
mammograms were obtained. Bilateral screening digital breast
tomosynthesis was performed. The images were evaluated with
computer-aided detection.

[L MLO synth-2D]
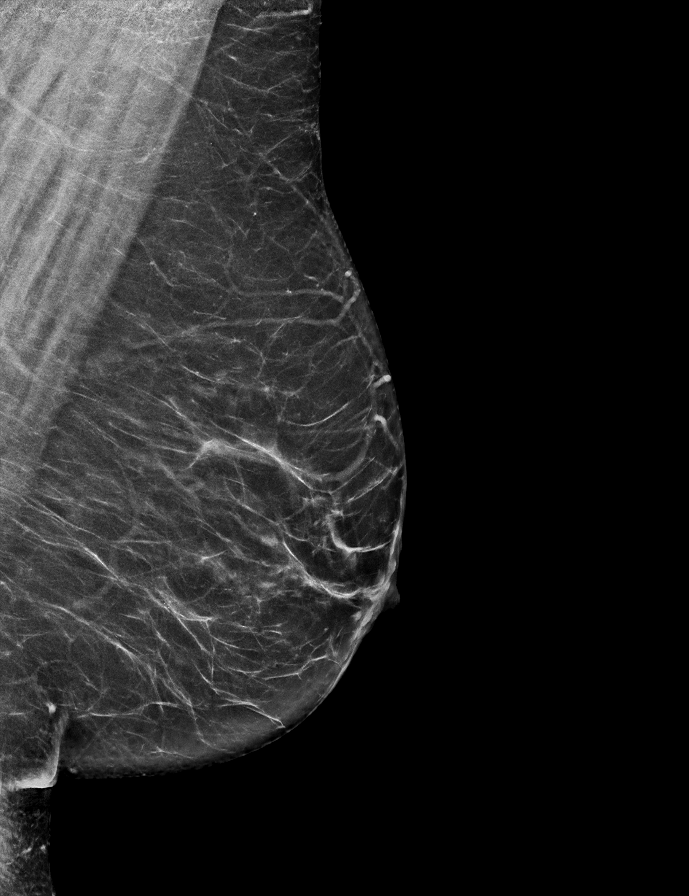

[R MLO synth-2D]
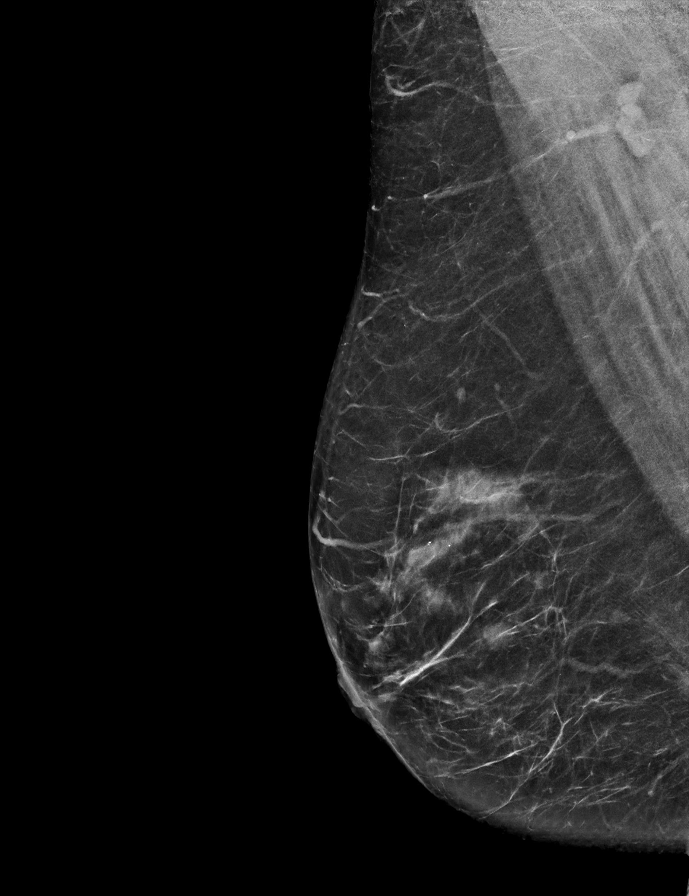

[L CC synth-2D]
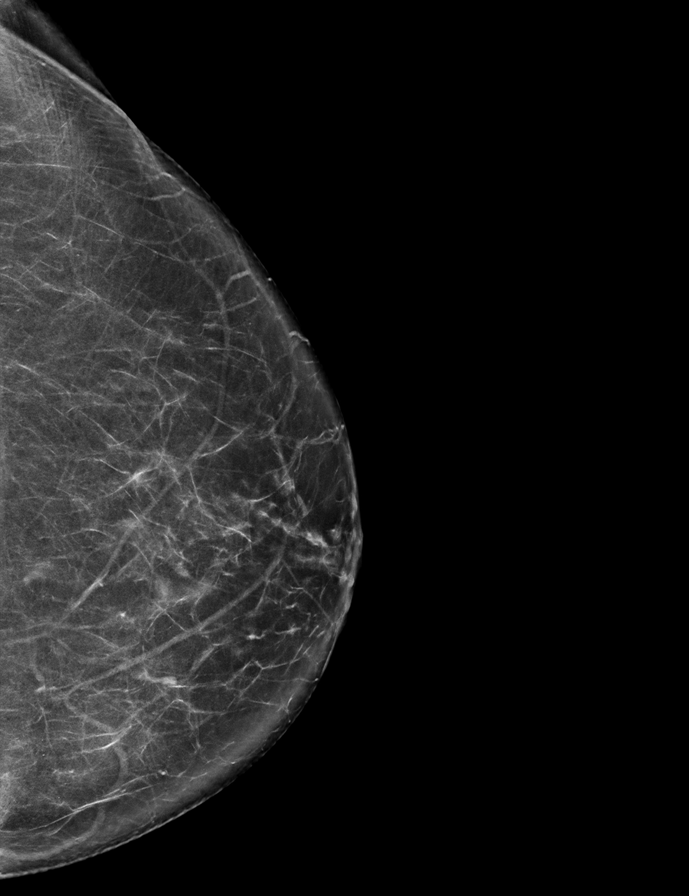

[R CC synth-2D]
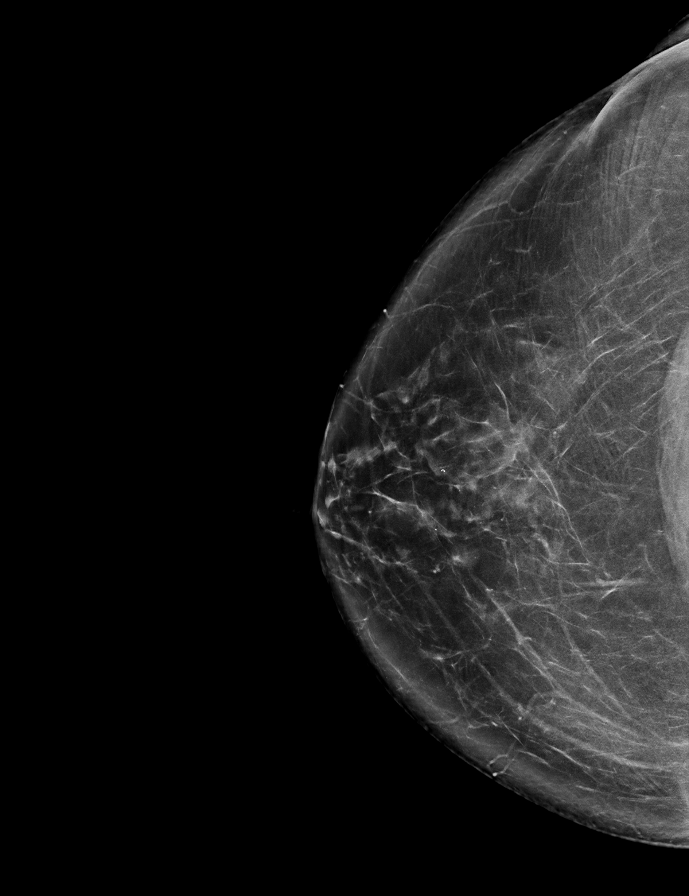

[R CC tomo · 2 of 76 frames shown]
[frame 25/76]
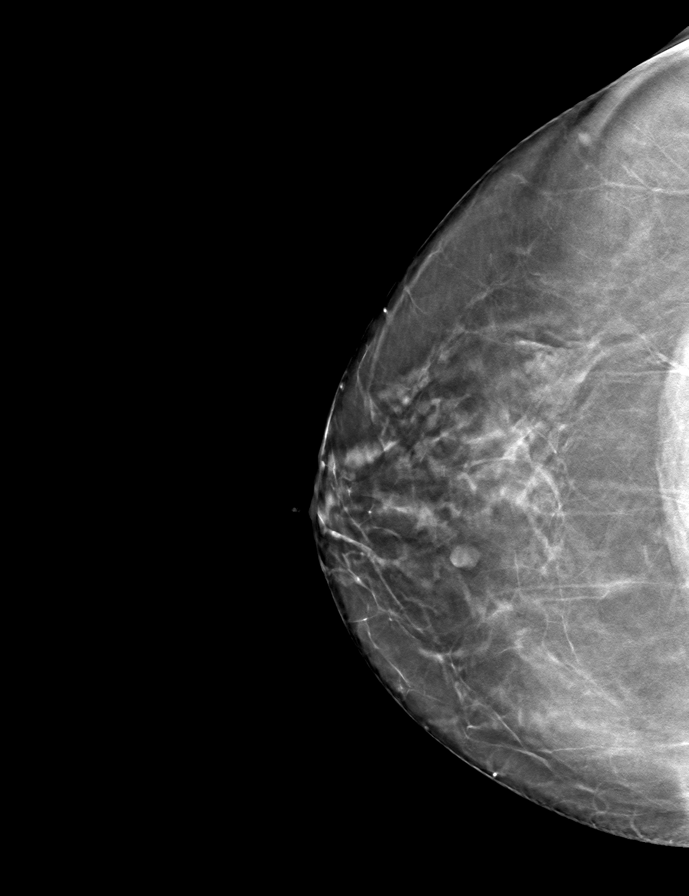
[frame 39/76]
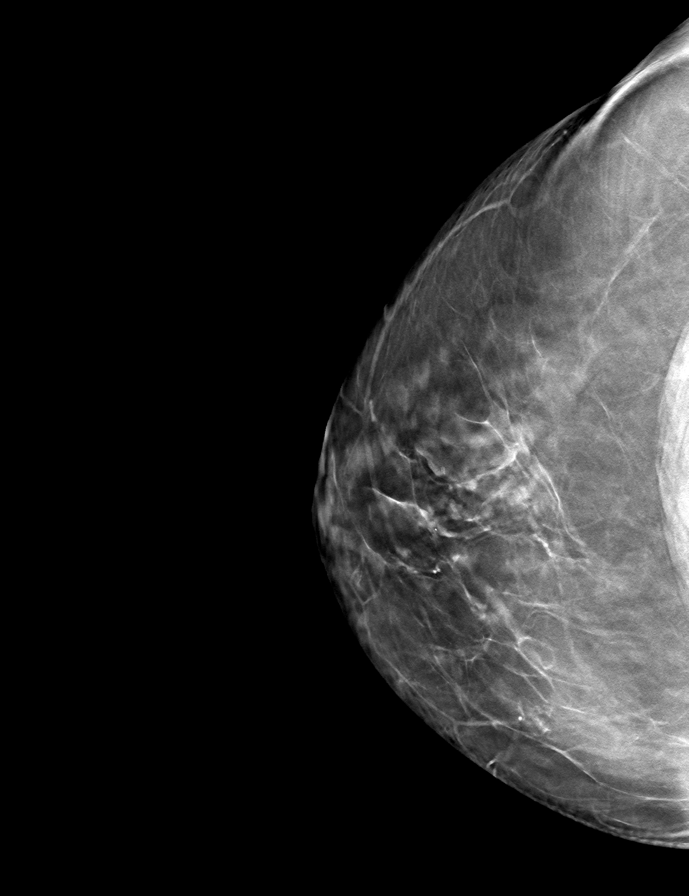

[L MLO tomo · tomo slice 35/69.0]
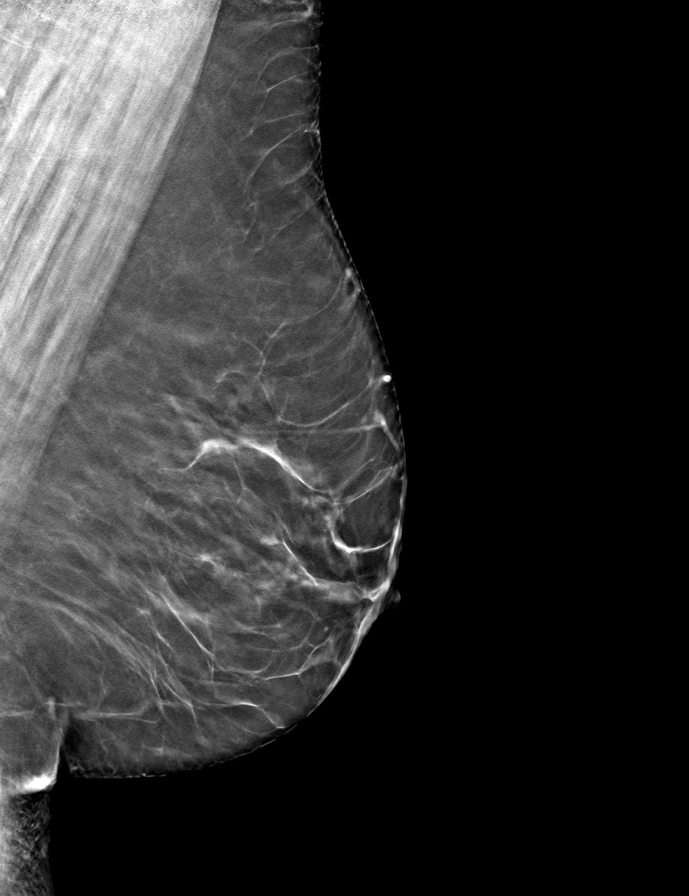

[L CC tomo · tomo slice 35/70.0]
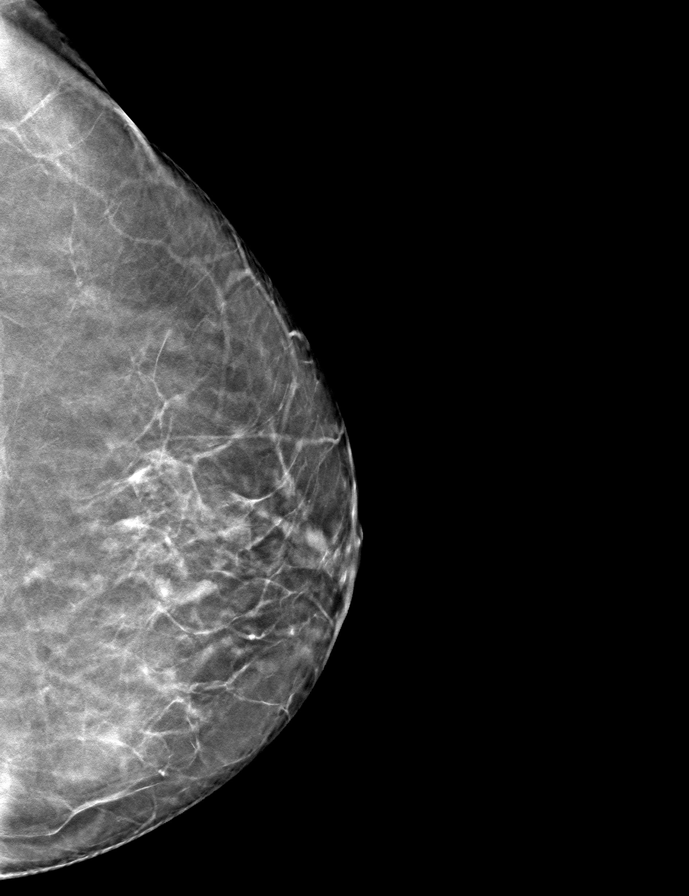

[R MLO tomo · tomo slice 37/72.0]
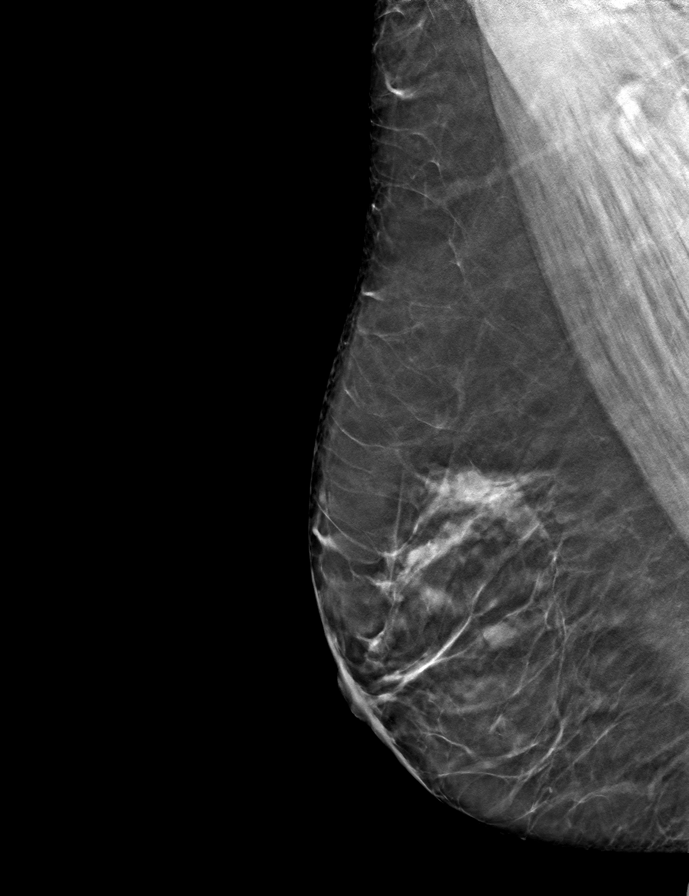

[9 of 24 positions shown; findings below may reference images not displayed]

ACR Breast Density Category b: There are scattered areas of
fibroglandular density.
FINDINGS: There are no findings suspicious for malignancy.
IMPRESSION: No mammographic evidence of malignancy. A result letter of this
screening mammogram will be mailed directly to the patient.

RECOMMENDATION:
Screening mammogram in one year. (Code:51-O-LD2)

BI-RADS CATEGORY  1: Negative.

## 2023-03-18 ENCOUNTER — Ambulatory Visit: Payer: BC Managed Care – PPO | Admitting: Family Medicine

## 2023-03-18 ENCOUNTER — Encounter: Payer: Self-pay | Admitting: Family Medicine

## 2023-03-18 VITALS — BP 94/54 | HR 59 | Temp 97.8°F | Ht 65.0 in | Wt 174.1 lb

## 2023-03-18 DIAGNOSIS — F5109 Other insomnia not due to a substance or known physiological condition: Secondary | ICD-10-CM

## 2023-03-18 DIAGNOSIS — E782 Mixed hyperlipidemia: Secondary | ICD-10-CM | POA: Diagnosis not present

## 2023-03-18 DIAGNOSIS — Z1159 Encounter for screening for other viral diseases: Secondary | ICD-10-CM

## 2023-03-18 DIAGNOSIS — Z0000 Encounter for general adult medical examination without abnormal findings: Secondary | ICD-10-CM

## 2023-03-18 DIAGNOSIS — M1710 Unilateral primary osteoarthritis, unspecified knee: Secondary | ICD-10-CM | POA: Diagnosis not present

## 2023-03-18 DIAGNOSIS — E559 Vitamin D deficiency, unspecified: Secondary | ICD-10-CM | POA: Insufficient documentation

## 2023-03-18 DIAGNOSIS — Z0001 Encounter for general adult medical examination with abnormal findings: Secondary | ICD-10-CM

## 2023-03-18 DIAGNOSIS — K219 Gastro-esophageal reflux disease without esophagitis: Secondary | ICD-10-CM | POA: Diagnosis not present

## 2023-03-18 DIAGNOSIS — B001 Herpesviral vesicular dermatitis: Secondary | ICD-10-CM | POA: Insufficient documentation

## 2023-03-18 DIAGNOSIS — Z114 Encounter for screening for human immunodeficiency virus [HIV]: Secondary | ICD-10-CM

## 2023-03-18 LAB — CMP14+EGFR
AST: 16 IU/L (ref 0–40)
eGFR: 91 mL/min/{1.73_m2} (ref >59)

## 2023-03-18 LAB — CBC WITH DIFFERENTIAL/PLATELET
Basos: 1 %
Hematocrit: 39.1 % (ref 34.0–46.6)
Immature Grans (Abs): 0 10*3/uL (ref 0.0–0.1)
Immature Granulocytes: 0 %
Lymphs: 26 %
Neutrophils Absolute: 2.7 10*3/uL (ref 1.4–7.0)

## 2023-03-18 LAB — LIPID PANEL
Triglycerides: 107 mg/dL (ref 0–149)
VLDL Cholesterol Cal: 19 mg/dL (ref 5–40)

## 2023-03-18 LAB — HEPATITIS C ANTIBODY

## 2023-03-18 MED ORDER — ZOLPIDEM TARTRATE 5 MG PO TABS
5.0000 mg | ORAL_TABLET | Freq: Every evening | ORAL | 0 refills | Status: DC | PRN
Start: 2023-03-18 — End: 2023-09-16

## 2023-03-18 MED ORDER — MELOXICAM 15 MG PO TABS
15.0000 mg | ORAL_TABLET | Freq: Every day | ORAL | 1 refills | Status: DC
Start: 2023-03-18 — End: 2023-09-16

## 2023-03-18 MED ORDER — ROSUVASTATIN CALCIUM 40 MG PO TABS: 40.0000 mg | ORAL_TABLET | Freq: Every day | ORAL | 3 refills | Status: DC

## 2023-03-18 MED ORDER — VALACYCLOVIR HCL 1 G PO TABS
1000.0000 mg | ORAL_TABLET | Freq: Every day | ORAL | 11 refills | Status: DC
Start: 2023-03-18 — End: 2024-01-20

## 2023-03-18 MED ORDER — PANTOPRAZOLE SODIUM 40 MG PO TBEC: 40.0000 mg | DELAYED_RELEASE_TABLET | Freq: Every day | ORAL | 3 refills | Status: DC

## 2023-03-18 NOTE — Progress Notes (Unsigned)
Complete physical exam  Patient: Robin Harrington   DOB: Jan 29, 1966   57 y.o. Female  MRN: [id number]  Subjective:    Chief Complaint  Patient presents with   Annual Exam    Robin Harrington is a 57 y.o. female who presents today for a complete physical exam. She reports consuming a general diet. Home exercise routine includes pickleball. She generally feels well. She reports sleeping well. She does not have additional problems to discuss today.   She would like a refill on [place]. She has an upcoming vacation. She has situational insomnia and really struggles when she is sleeping away from home. She had one 15 tablet fill last year that has lasted until now.   She reports GERD is stable. Denies dysphagia, nausea, or vomiting. She does sometime have heartburn depending on diet. She is compliant with medications.   She has been compliant with crestor. Denies side effects. She is fasting for labs today.   She would like a refill on valtrex for fever blisters. She typically gets this around this time of the year due to increased stress with her job. Does not currently have a fever blister.   Would also like refill on mobic for arthritis. Stable symptoms. Uses prn with good relief.   Most recent fall risk assessment:    12/29/2022    3:26 PM  Fall Risk   Falls in the past year? 0     Most recent depression screenings:    03/18/2023    9:31 AM 12/29/2022    3:26 PM  PHQ 2/9 Scores  PHQ - 2 Score 0 0  PHQ- 9 Score 3 4    Vision:Within last year and Dental: No current dental problems and Receives regular dental care  Past Medical History:  Diagnosis Date   Acne    Allergy    Hyperlipidemia       Patient Care Team: [name], FNP as PCP - General (Family Medicine)   Outpatient Medications Prior to Visit  Medication Sig   albuterol (VENTOLIN HFA) 108 (90 Base) MCG/ACT inhaler Inhale 2 puffs into the lungs every 6 (six) hours as needed for wheezing or shortness of breath.    Chlorphen-PE-Acetaminophen 4-10-325 MG TABS Take 1 tablet by mouth every 6 (six) hours as needed.   Doxycycline, Rosacea, (ORACEA) 40 MG CPDR Take 40 mg by mouth daily.   fluticasone (FLONASE) 50 MCG/ACT nasal spray Place 2 sprays into both nostrils daily.   [DISCONTINUED] meloxicam (MOBIC) 15 MG tablet Take 1 tablet (15 mg total) by mouth daily.   [DISCONTINUED] pantoprazole (PROTONIX) 40 MG tablet Take 1 tablet (40 mg total) by mouth daily.   [DISCONTINUED] rosuvastatin (CRESTOR) 40 MG tablet Take 1 tablet (40 mg total) by mouth daily.   [DISCONTINUED] valACYclovir (VALTREX) 1000 MG tablet Take 1 tablet (1,000 mg total) by mouth daily.   [DISCONTINUED] zolpidem (AMBIEN) 5 MG tablet TAKE ONE TABLET BY MOUTH AT BEDTIME AS NEEDED FOR SLEEP   [DISCONTINUED] doxycycline (VIBRA-TABS) 100 MG tablet Take 1 tablet (100 mg total) by mouth 2 (two) times daily. 1 po bid   No facility-administered medications prior to visit.    ROS Negative unless specially indicated above in HPI.      Objective:     BP (!) 94/54   Pulse (!) 59   Temp 97.8 F (36.6 C) (Temporal)   Ht 5\' 5"  (1.651 m)   Wt 174 lb 2 oz (79 kg)   SpO2 97%  BMI 28.98 kg/m    Physical Exam Vitals and nursing note reviewed.  Constitutional:      General: She is not in acute distress.    Appearance: Normal appearance. She is not ill-appearing, toxic-appearing or diaphoretic.  HENT:     Head: Normocephalic.     Right Ear: Tympanic membrane, ear canal and external ear normal.     Left Ear: Tympanic membrane, ear canal and external ear normal.     Nose: Nose normal.     Mouth/Throat:     Mouth: Mucous membranes are moist.     Pharynx: Oropharynx is clear.  Eyes:     Extraocular Movements: Extraocular movements intact.     Conjunctiva/sclera: Conjunctivae normal.     Pupils: Pupils are equal, round, and reactive to light.  Neck:     Thyroid: No thyroid mass, thyromegaly or thyroid tenderness.     Vascular: No carotid  bruit.  Cardiovascular:     Rate and Rhythm: Normal rate and regular rhythm.     Pulses: Normal pulses.     Heart sounds: Normal heart sounds. No murmur heard.    No friction rub. No gallop.  Pulmonary:     Effort: Pulmonary effort is normal.     Breath sounds: Normal breath sounds.  Abdominal:     General: Bowel sounds are normal. There is no distension.     Palpations: Abdomen is soft. There is no mass.     Tenderness: There is no abdominal tenderness. There is no guarding.  Musculoskeletal:        General: Normal range of motion.     Cervical back: Normal range of motion and neck supple. No tenderness.     Right lower leg: No edema.     Left lower leg: No edema.  Skin:    General: Skin is warm and dry.     Capillary Refill: Capillary refill takes less than 2 seconds.     Findings: No lesion or rash.  Neurological:     General: No focal deficit present.     Mental Status: She is alert and oriented to person, place, and time.     Cranial Nerves: No cranial nerve deficit.     Motor: No weakness.     Coordination: Coordination normal.     Gait: Gait normal.  Psychiatric:        Mood and Affect: Mood normal.        Behavior: Behavior normal.        Thought Content: Thought content normal.        Judgment: Judgment normal.      No results found for any visits on 03/18/23.     Assessment & Plan:    Routine Health Maintenance and Physical Exam  Scarlette was seen today for annual exam.  Diagnoses and all orders for this visit:  Routine general medical examination at a health care facility  Mixed hyperlipidemia Labs pending. Continue crestor, diet, exercise.  -     rosuvastatin (CRESTOR) 40 MG tablet; Take 1 tablet (40 mg total) by mouth daily. -     CBC with Differential/Platelet -     CMP14+EGFR -     Lipid panel  Gastroesophageal reflux disease without esophagitis Well controlled on current regimen.  -     pantoprazole (PROTONIX) 40 MG tablet; Take 1 tablet (40 mg  total) by mouth daily.  Patellofemoral arthritis Continue prn.  -     meloxicam (MOBIC) 15 MG tablet; Take 1  tablet (15 mg total) by mouth daily.  Vitamin D deficiency -     Vitamin D, 25-hydroxy  Situational insomnia One time refill provided.  -     zolpidem (AMBIEN) 5 MG tablet; Take 1 tablet (5 mg total) by mouth at bedtime as needed. for sleep  Fever blister Well controlled on current regimen.  -     valACYclovir (VALTREX) 1000 MG tablet; Take 1 tablet (1,000 mg total) by mouth daily.  Need for hepatitis C screening test -     Hepatitis C antibody  Encounter for screening for HIV -     HIV Antibody (routine testing w rflx)   Immunization History  Administered Date(s) Administered   Influenza,inj,Quad PF,6+ Mos 07/14/2015, 05/28/2019, 07/12/2020   Influenza-Unspecified 09/09/2016   PFIZER(Purple Top)SARS-COV-2 Vaccination 01/04/2020, 01/30/2020   PPD Test 03/18/2022   Tdap 07/12/2020    Health Maintenance  Topic Date Due   HIV Screening  Never done   Hepatitis C Screening  Never done   Zoster Vaccines- Shingrix (1 of 2) 03/31/2023 (Originally 12/25/2015)   COVID-19 Vaccine (3 - 2023-24 season) 08/28/2023 (Originally 06/20/2022)   INFLUENZA VACCINE  05/21/2023   PAP SMEAR-Modifier  01/12/2024   MAMMOGRAM  03/18/2024   DTaP/Tdap/Td (2 - Td or Tdap) 07/12/2030   Colonoscopy  03/26/2031   HPV VACCINES  Aged Out    Discussed health benefits of physical activity, and encouraged her to engage in regular exercise appropriate for her age and condition.  Problem List Items Addressed This Visit       Digestive   Gastroesophageal reflux disease   Relevant Medications   pantoprazole (PROTONIX) 40 MG tablet   Fever blister   Relevant Medications   valACYclovir (VALTREX) 1000 MG tablet     Musculoskeletal and Integument   Patellofemoral arthritis   Relevant Medications   meloxicam (MOBIC) 15 MG tablet     Other   Mixed hyperlipidemia   Relevant Medications    rosuvastatin (CRESTOR) 40 MG tablet   Other Relevant Orders   CBC with Differential/Platelet (Completed)   CMP14+EGFR (Completed)   Lipid panel (Completed)   Vitamin D deficiency   Relevant Orders   Vitamin D, 25-hydroxy (Completed)   Other Visit Diagnoses     Routine general medical examination at a health care facility    -  Primary   Situational insomnia       Relevant Medications   zolpidem (AMBIEN) 5 MG tablet   Need for hepatitis C screening test       Relevant Orders   Hepatitis C antibody (Completed)   Encounter for screening for HIV       Relevant Orders   HIV Antibody (routine testing w rflx) (Completed)      Return in about 6 months (around 09/18/2023) for chronic follow up.   The patient indicates understanding of these issues and agrees with the plan.  [name], FNP

## 2023-03-18 NOTE — Patient Instructions (Signed)

## 2023-03-19 ENCOUNTER — Other Ambulatory Visit: Payer: Self-pay | Admitting: Family Medicine

## 2023-03-19 DIAGNOSIS — R768 Other specified abnormal immunological findings in serum: Secondary | ICD-10-CM

## 2023-03-19 LAB — CBC WITH DIFFERENTIAL/PLATELET
Basophils Absolute: 0 10*3/uL (ref 0.0–0.2)
EOS (ABSOLUTE): 0.1 10*3/uL (ref 0.0–0.4)
Eos: 2 %
Hemoglobin: 12.8 g/dL (ref 11.1–15.9)
Lymphocytes Absolute: 1.1 10*3/uL (ref 0.7–3.1)
MCH: 26.6 pg (ref 26.6–33.0)
MCHC: 32.7 g/dL (ref 31.5–35.7)
MCV: 81 fL (ref 79–97)
Monocytes Absolute: 0.4 10*3/uL (ref 0.1–0.9)
Monocytes: 8 %
Neutrophils: 63 %
Platelets: 323 10*3/uL (ref 150–450)
RBC: 4.81 x10E6/uL (ref 3.77–5.28)
RDW: 13.2 % (ref 11.7–15.4)
WBC: 4.3 10*3/uL (ref 3.4–10.8)

## 2023-03-19 LAB — LIPID PANEL
Chol/HDL Ratio: 3.1 ratio (ref 0.0–4.4)
Cholesterol, Total: 190 mg/dL (ref 100–199)
HDL: 62 mg/dL (ref >39)
LDL Chol Calc (NIH): 109 mg/dL — ABNORMAL HIGH (ref 0–99)

## 2023-03-19 LAB — HIV ANTIBODY (ROUTINE TESTING W REFLEX): HIV Screen 4th Generation wRfx: NONREACTIVE

## 2023-03-19 LAB — CMP14+EGFR
ALT: 10 IU/L (ref 0–32)
Albumin/Globulin Ratio: 2 (ref 1.2–2.2)
Albumin: 4.2 g/dL (ref 3.8–4.9)
Alkaline Phosphatase: 56 IU/L (ref 44–121)
BUN/Creatinine Ratio: 20 (ref 9–23)
BUN: 15 mg/dL (ref 6–24)
Bilirubin Total: 0.4 mg/dL (ref 0.0–1.2)
CO2: 22 mmol/L (ref 20–29)
Calcium: 9.5 mg/dL (ref 8.7–10.2)
Chloride: 104 mmol/L (ref 96–106)
Creatinine, Ser: 0.76 mg/dL (ref 0.57–1.00)
Globulin, Total: 2.1 g/dL (ref 1.5–4.5)
Glucose: 87 mg/dL (ref 70–99)
Potassium: 4.4 mmol/L (ref 3.5–5.2)
Sodium: 140 mmol/L (ref 134–144)
Total Protein: 6.3 g/dL (ref 6.0–8.5)

## 2023-03-19 LAB — VITAMIN D 25 HYDROXY (VIT D DEFICIENCY, FRACTURES): Vit D, 25-Hydroxy: 30 ng/mL (ref 30.0–100.0)

## 2023-03-20 ENCOUNTER — Other Ambulatory Visit: Payer: BC Managed Care – PPO

## 2023-03-20 ENCOUNTER — Other Ambulatory Visit: Payer: Self-pay | Admitting: Family Medicine

## 2023-03-20 ENCOUNTER — Ambulatory Visit: Payer: BC Managed Care – PPO | Admitting: Family Medicine

## 2023-03-20 DIAGNOSIS — R768 Other specified abnormal immunological findings in serum: Secondary | ICD-10-CM

## 2023-03-21 LAB — HCV RNA QUANT: Hepatitis C Quantitation: NOT DETECTED IU/mL

## 2023-04-08 NOTE — Progress Notes (Signed)
[facility] [address] [place] [address] Phone: [phone] Subjective:   Robin Harrington, am serving as a scribe for Dr. Antoine Primas.  I'm seeing this patient by the request  of:  [name], FNP  CC: Follow-up on foot pain  BJY:NWGNFAOZHY  03/05/2023 Midfoot arthritis noted. Discussed icing regimen and home exercises, discussed which activities to do and which ones to avoid. Follow-up again in 6 to 8 weeks   Injection given today and tolerated the procedure well, discussed icing regimen and home exercises.  Discussed the arthritic changes of the midfoot that is also contributing at this time.  Increase activity slowly otherwise.  Follow-up with me again 6 to 8 weeks discussed proper shoes, topical anti-inflammatories      Update 04/14/2023 Robin Harrington is a 57 y.o. female coming in with complaint of B foot and ankle and R shoulder pain. Patient states that pain in feet is intermittent. Concerned about cyst that is on R foot. Cyst injected today  C/o R shoulder pain for past couple of months. Working on posture with wall exercise. Pain anterior.   Pain radiates down to her wrist. Did fall but is unsure if she had FOOSH.       Past Medical History:  Diagnosis Date   Acne    Allergy    Hyperlipidemia    No past surgical history on file. Social History   Socioeconomic History   Marital status: Married    Spouse name: Not on file   Number of children: Not on file   Years of education: Not on file   Highest education level: Not on file  Occupational History   Not on file  Tobacco Use   Smoking status: Never   Smokeless tobacco: Never  Vaping Use   Vaping Use: Never used  Substance and Sexual Activity   Alcohol use: No   Drug use: No   Sexual activity: Not on file  Other Topics Concern   Not on file  Social History Narrative   Not on file   Social Determinants of Health   Financial Resource Strain: Not on file   Food Insecurity: Not on file  Transportation Needs: Not on file  Physical Activity: Not on file  Stress: Not on file  Social Connections: Not on file   No Known Allergies Family History  Problem Relation Age of Onset   Hypertension Mother    Atrial fibrillation Mother    Cancer Mother        melanoma    Cancer Father        colon    Heart disease Father    Breast cancer Neg Hx      Current Outpatient Medications (Cardiovascular):    rosuvastatin (CRESTOR) 40 MG tablet, Take 1 tablet (40 mg total) by mouth daily.  Current Outpatient Medications (Respiratory):    albuterol (VENTOLIN HFA) 108 (90 Base) MCG/ACT inhaler, Inhale 2 puffs into the lungs every 6 (six) hours as needed for wheezing or shortness of breath.   Chlorphen-PE-Acetaminophen 4-10-325 MG TABS, Take 1 tablet by mouth every 6 (six) hours as needed.   fluticasone (FLONASE) 50 MCG/ACT nasal spray, Place 2 sprays into both nostrils daily.  Current Outpatient Medications (Analgesics):    meloxicam (MOBIC) 15 MG tablet, Take 1 tablet (15 mg total) by mouth daily.   Current Outpatient Medications (Other):    Doxycycline, Rosacea, (ORACEA) 40 MG CPDR, Take 40 mg by mouth daily.  pantoprazole (PROTONIX) 40 MG tablet, Take 1 tablet (40 mg total) by mouth daily.   valACYclovir (VALTREX) 1000 MG tablet, Take 1 tablet (1,000 mg total) by mouth daily.   zolpidem (AMBIEN) 5 MG tablet, Take 1 tablet (5 mg total) by mouth at bedtime as needed. for sleep   Reviewed prior external information including notes and imaging from  primary care provider As well as notes that were available from care everywhere and other healthcare systems.  Past medical history, social, surgical and family history all reviewed in electronic medical record.  No pertanent information unless stated regarding to the chief complaint.   Review of Systems:  No headache, visual changes, nausea, vomiting, diarrhea, constipation, dizziness, abdominal pain,  skin rash, fevers, chills, night sweats, weight loss, swollen lymph nodes, body aches, joint swelling, chest pain, shortness of breath, mood changes. POSITIVE muscle aches  Objective  Blood pressure 118/84, pulse 62, height 5\' 5"  (1.651 m), weight 174 lb (78.9 kg), SpO2 98 %.   General: No apparent distress alert and oriented x3 mood and affect normal, dressed appropriately.  HEENT: Pupils equal, extraocular movements intact  Respiratory: Patient's speak in full sentences and does not appear short of breath  Cardiovascular: No lower extremity edema, non tender, no erythema  Right shoulder exam shows the patient does have positive impingement noted.  Positive Hawking's test.  Negative empty can.  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  Breakdown of the transverse arch noted as well.  Limited muscular skeletal ultrasound was performed and interpreted by Antoine Primas, M  Limited ultrasound of patient's right shoulder shows that patient does have hypoechoic changes in the subscapularis space.  Consistent with more of a bursitis.  Rotator cuff tendon though appears to be unremarkable of the subscapularis and the supraspinatus.  Bicep tendon is unremarkable. Impression: Bursitis of the shoulder   97110; 15 additional minutes spent for Therapeutic exercises as stated in above notes.  This included exercises focusing on stretching, strengthening, with significant focus on eccentric aspects.   Long term goals include an improvement in range of motion, strength, endurance as well as avoiding reinjury. Patient's frequency would include in 1-2 times a day, 3-5 times a week for a duration of 6-12 weeks.  Shoulder Exercises that included:  Basic scapular stabilization to include adduction and depression of scapula Scaption, focusing on proper movement and good control Internal and External rotation utilizing a theraband, with elbow tucked at side entire time Rows with theraband  Proper  technique shown and discussed handout in great detail with ATC.  All questions were discussed and answered.     Impression and Recommendations:     The above documentation has been reviewed and is accurate and complete Judi Saa, DO

## 2023-04-14 ENCOUNTER — Other Ambulatory Visit: Payer: Self-pay

## 2023-04-14 ENCOUNTER — Ambulatory Visit (INDEPENDENT_AMBULATORY_CARE_PROVIDER_SITE_OTHER): Payer: BC Managed Care – PPO

## 2023-04-14 ENCOUNTER — Ambulatory Visit: Payer: BC Managed Care – PPO | Admitting: Family Medicine

## 2023-04-14 ENCOUNTER — Encounter: Payer: Self-pay | Admitting: Family Medicine

## 2023-04-14 ENCOUNTER — Other Ambulatory Visit: Payer: Self-pay | Admitting: *Deleted

## 2023-04-14 VITALS — BP 118/84 | HR 62 | Ht 65.0 in | Wt 174.0 lb

## 2023-04-14 DIAGNOSIS — M71372 Other bursal cyst, left ankle and foot: Secondary | ICD-10-CM | POA: Diagnosis not present

## 2023-04-14 DIAGNOSIS — L700 Acne vulgaris: Secondary | ICD-10-CM

## 2023-04-14 DIAGNOSIS — M25511 Pain in right shoulder: Secondary | ICD-10-CM

## 2023-04-14 DIAGNOSIS — M7551 Bursitis of right shoulder: Secondary | ICD-10-CM | POA: Diagnosis not present

## 2023-04-14 DIAGNOSIS — M79672 Pain in left foot: Secondary | ICD-10-CM | POA: Diagnosis not present

## 2023-04-14 NOTE — Patient Instructions (Signed)
Shoulder exercises Xray today Thicker grip  Voltaren to shoulder and foot after playing Court shoes or Altra-No HOKA on the court See me in 2-3 months

## 2023-04-14 NOTE — Assessment & Plan Note (Signed)
Impingement noted on the subacromial view.  Discussed with patient about repetitive motions, icing and topical anti-inflammatories.  Increase activity slowly.  Follow-up with me again in 6 to 8 weeks worsening pain consider injections or formal physical therapy.  Discussed the good grip as well.

## 2023-04-14 NOTE — Assessment & Plan Note (Signed)
Mild increase again on the cyst.  Will continue to monitor.  Does have the midfoot arthritis that we will monitor.  Worsening pain will consider injection.  Follow-up with me again 2 to 3 months

## 2023-04-15 MED ORDER — ORACEA 40 MG PO CPDR
40.0000 mg | DELAYED_RELEASE_CAPSULE | Freq: Every day | ORAL | 1 refills | Status: DC
Start: 2023-04-15 — End: 2023-09-16

## 2023-05-06 ENCOUNTER — Ambulatory Visit: Payer: BC Managed Care – PPO | Admitting: Family Medicine

## 2023-05-06 ENCOUNTER — Encounter: Payer: Self-pay | Admitting: Family Medicine

## 2023-05-06 ENCOUNTER — Other Ambulatory Visit: Payer: Self-pay

## 2023-05-06 VITALS — BP 122/70 | HR 86 | Ht 65.0 in

## 2023-05-06 DIAGNOSIS — M7551 Bursitis of right shoulder: Secondary | ICD-10-CM

## 2023-05-06 DIAGNOSIS — M25511 Pain in right shoulder: Secondary | ICD-10-CM | POA: Diagnosis not present

## 2023-05-06 DIAGNOSIS — M25552 Pain in left hip: Secondary | ICD-10-CM

## 2023-05-06 NOTE — Progress Notes (Signed)
[name] [address] [place] [address] Phone: [phone] Subjective:   Robin Harrington, am serving as a scribe for Dr. Antoine Primas.  I'm seeing this patient by the request  of:  [name], FNP  CC: Right shoulder pain follow-up  [id number]  04/14/2023 Impingement noted on the subacromial view.  Discussed with patient about repetitive motions, icing and topical anti-inflammatories.  Increase activity slowly.  Follow-up with me again in 6 to 8 weeks worsening pain consider injections or formal physical therapy.  Discussed the good grip as well.     Mild increase again on the cyst.  Will continue to monitor.  Does have the midfoot arthritis that we will monitor.  Worsening pain will consider injection.  Follow-up with me again 2 to 3 months   Robin Harrington is a 57 y.o. female coming in with complaint of R shoulder pain. Pain in shoulder is worse. Hurts constantly. Isn't able to play pickleball. Patient states that when she starts that seems to do relatively okay but throughout the time that seems to worsen.  Usually the night a lot of activity has significant difficulty.      Past Medical History:  Diagnosis Date   Acne    Allergy    Hyperlipidemia    No past surgical history on file. Social History   Socioeconomic History   Marital status: Married    Spouse name: Not on file   Number of children: Not on file   Years of education: Not on file   Highest education level: Not on file  Occupational History   Not on file  Tobacco Use   Smoking status: Never   Smokeless tobacco: Never  Vaping Use   Vaping status: Never Used  Substance and Sexual Activity   Alcohol use: No   Drug use: No   Sexual activity: Not on file  Other Topics Concern   Not on file  Social History Narrative   Not on file   Social Determinants of Health   Financial Resource Strain: Not on file  Food Insecurity: Not on file  Transportation  Needs: Not on file  Physical Activity: Not on file  Stress: Not on file  Social Connections: Not on file   No Known Allergies Family History  Problem Relation Age of Onset   Hypertension Mother    Atrial fibrillation Mother    Cancer Mother        melanoma    Cancer Father        colon    Heart disease Father    Breast cancer Neg Hx      Current Outpatient Medications (Cardiovascular):    rosuvastatin (CRESTOR) 40 MG tablet, Take 1 tablet (40 mg total) by mouth daily.  Current Outpatient Medications (Respiratory):    albuterol (VENTOLIN HFA) 108 (90 Base) MCG/ACT inhaler, Inhale 2 puffs into the lungs every 6 (six) hours as needed for wheezing or shortness of breath.   Chlorphen-PE-Acetaminophen 4-10-325 MG TABS, Take 1 tablet by mouth every 6 (six) hours as needed.   fluticasone (FLONASE) 50 MCG/ACT nasal spray, Place 2 sprays into both nostrils daily.  Current Outpatient Medications (Analgesics):    meloxicam (MOBIC) 15 MG tablet, Take 1 tablet (15 mg total) by mouth daily.   Current Outpatient Medications (Other):    Doxycycline, Rosacea, (ORACEA) 40 MG CPDR, Take 40 mg by mouth daily.   pantoprazole (PROTONIX) 40 MG tablet, Take 1 tablet (40 mg total)  by mouth daily.   valACYclovir (VALTREX) 1000 MG tablet, Take 1 tablet (1,000 mg total) by mouth daily.   zolpidem (AMBIEN) 5 MG tablet, Take 1 tablet (5 mg total) by mouth at bedtime as needed. for sleep   Reviewed prior external information including notes and imaging from  primary care provider As well as notes that were available from care everywhere and other healthcare systems.  Past medical history, social, surgical and family history all reviewed in electronic medical record.  No pertanent information unless stated regarding to the chief complaint.   Review of Systems:  No headache, visual changes, nausea, vomiting, diarrhea, constipation, dizziness, abdominal pain, skin rash, fevers, chills, night sweats, weight  loss, swollen lymph nodes, body aches, joint swelling, chest pain, shortness of breath, mood changes. POSITIVE muscle aches  Objective  Blood pressure 122/70, pulse 86, height 5\' 5"  (1.651 m), SpO2 95%.   General: No apparent distress alert and oriented x3 mood and affect normal, dressed appropriately.  HEENT: Pupils equal, extraocular movements intact  Respiratory: Patient's speak in full sentences and does not appear short of breath  Cardiovascular: No lower extremity edema, non tender, no erythema  Right shoulder exam does still have impingement noted with Neer and Hawkins.  No significant weakness noted on exam.  Does have some more voluntary guarding than usual well.  Limited muscular skeletal ultrasound was performed and interpreted by Antoine Primas, M   Limited ultrasound shows the patient does have hypoechoic changes in the subscapularis area.  Fairly significant.  Does have some degenerative changes noted of the supraspinatus. Impression: Worsening shoulder  Procedure: Real-time Ultrasound Guided Injection of right subacromial space Device: GE Logiq Q7  Ultrasound guided injection is preferred based studies that show increased duration, increased effect, greater accuracy, decreased procedural pain, increased response rate with ultrasound guided versus blind injection.  Verbal informed consent obtained.  Time-out conducted.  Noted no overlying erythema, induration, or other signs of local infection.  Skin prepped in a sterile fashion.  Local anesthesia: Topical Ethyl chloride.  With sterile technique and under real time ultrasound guidance:  Joint visualized.  23g 1  inch needle inserted lateral approach. Pictures taken for needle placement. Patient did have injection of , 2 cc of 0.5% Marcaine, and 1.0 cc of Kenalog 40 mg/dL. Completed without difficulty  Pain immediately improved suggesting accurate placement of the medication.  Advised to call if fevers/chills, erythema,  induration, drainage, or persistent bleeding.  Impression: Technically successful ultrasound guided injection.    Impression and Recommendations:     The above documentation has been reviewed and is accurate and complete Judi Saa, DO

## 2023-05-06 NOTE — Patient Instructions (Addendum)
Injection in shoulder today See you at next appointment

## 2023-05-06 NOTE — Assessment & Plan Note (Signed)
Worsening symptoms at this point.  Ultrasound did show that there was a potential for patient having a rotator cuff injury.  We do need to monitor closely.  Worsening pain advanced imaging would be warranted with patient failing formal physical therapy, injections, icing regimen and oral anti-inflammatories.  Follow-up with me again otherwise in 6 to 8 weeks.

## 2023-05-18 ENCOUNTER — Telehealth: Payer: Self-pay

## 2023-05-18 ENCOUNTER — Other Ambulatory Visit: Payer: Self-pay

## 2023-05-18 DIAGNOSIS — M25511 Pain in right shoulder: Secondary | ICD-10-CM

## 2023-05-18 NOTE — Telephone Encounter (Signed)
Patient called stating that the injection she had and the regimen she has been following has not been helping and would like to go through with the MRI of the shoulder. Can you let patient know through mychart once it has been ordered.

## 2023-05-27 NOTE — Telephone Encounter (Signed)
Patient called asking if there was somewhere else she could go for the MRI that would be sooner than currently scheduled?  Please advise.  *It is an MRI Arthrogram*

## 2023-06-02 ENCOUNTER — Ambulatory Visit (INDEPENDENT_AMBULATORY_CARE_PROVIDER_SITE_OTHER): Payer: BC Managed Care – PPO

## 2023-06-02 ENCOUNTER — Ambulatory Visit: Payer: BC Managed Care – PPO | Admitting: Sports Medicine

## 2023-06-02 DIAGNOSIS — M25511 Pain in right shoulder: Secondary | ICD-10-CM | POA: Diagnosis not present

## 2023-06-02 DIAGNOSIS — M7551 Bursitis of right shoulder: Secondary | ICD-10-CM

## 2023-06-02 MED ORDER — TRIAMCINOLONE ACETONIDE 40 MG/ML IJ SUSP
40.0000 mg | Freq: Once | INTRAMUSCULAR | Status: AC
Start: 2023-06-02 — End: 2023-06-02
  Administered 2023-06-02: 40 mg via INTRAMUSCULAR

## 2023-06-02 NOTE — Progress Notes (Signed)
    Procedures performed today:    Procedure: Real-time Ultrasound Guided gadolinium contrast injection of right glenohumeral joint Device: Samsung HS60  Verbal informed consent obtained.  Time-out conducted.  Noted no overlying erythema, induration, or other signs of local infection.  Skin prepped in a sterile fashion.  Local anesthesia: Topical Ethyl chloride.  With sterile technique and under real time ultrasound guidance: Noted infraspinatus tendinosis, I advanced a 22-gauge spinal needle into the glenohumeral joint from a posterior approach, I then injected 1 cc kenalog 40, 2 cc lidocaine, 2 cc bupivacaine, syringe switched and 0.1 cc gadolinium injected, syringe again switched and 10 cc sterile saline used to fully distend the joint. Joint visualized and capsule seen distending confirming intra-articular placement of contrast material and medication. Completed without difficulty  Advised to call if fevers/chills, erythema, induration, drainage, or persistent bleeding.  Images permanently stored in PACS Impression: Technically successful ultrasound guided gadolinium contrast injection for MR arthrography.  Please see separate MR arthrogram report.  Independent interpretation of notes and tests performed by another provider:   None.  Brief History, Exam, Impression, and Recommendations:    Acute shoulder bursitis, right Injection performed today for MR arthrography, further management per primary treating provider.    ____________________________________________ Ihor Austin. Benjamin Stain, M.D., ABFM., CAQSM., AME. Primary Care and Sports Medicine [facility]  Adjunct Professor of Family Medicine  [employer] [facility] [employer]  Restaurant manager, fast food

## 2023-06-02 NOTE — Addendum Note (Signed)
Addended by: Carren Rang A on: 06/02/2023 04:29 PM   Modules accepted: Orders

## 2023-06-02 NOTE — Assessment & Plan Note (Signed)
Injection performed today for MR arthrography, further management per primary treating provider. 

## 2023-06-18 NOTE — Progress Notes (Signed)
[facility] [address] [place] [address] Phone: [phone] Subjective:   Robin Harrington, am serving as a scribe for Dr. Antoine Primas.  I'm seeing this patient by the request  of:  [name], FNP  CC: Shoulder pain  [id number]  05/06/2023 Worsening symptoms at this point. Ultrasound did show that there was a potential for patient having a rotator cuff injury. We do need to monitor closely. Worsening pain advanced imaging would be warranted with patient failing formal physical therapy, injections, icing regimen and oral anti-inflammatories. Follow-up with me again otherwise in 6 to 8 weeks.   Updated 06/24/2023 Robin Harrington is a 57 y.o. female coming in with complaint of shoulder pain, was found to have a rotator cuff injury and given an injection nearly 2 months ago.  Patient was to do home exercises.  Patient states that injection did not help last visit. Backhand increases her pain.   Patient previously did have the MRI done on August 13.  It showed to have moderate tendinosis with a high-grade partial-thickness near complete articular surface tear of the supraspinatus with and moderate tearing of the infraspinatus as well as a fairly significant tendinosis with what appears to be a very small intra-articular long head of the bicep tendon tear     Past Medical History:  Diagnosis Date   Acne    Allergy    Hyperlipidemia    No past surgical history on file. Social History   Socioeconomic History   Marital status: Divorced    Spouse name: Not on file   Number of children: Not on file   Years of education: Not on file   Highest education level: Master's degree (e.g., MA, MS, MEng, MEd, MSW, MBA)  Occupational History   Not on file  Tobacco Use   Smoking status: Never   Smokeless tobacco: Never  Vaping Use   Vaping status: Never Used  Substance and Sexual Activity   Alcohol use: No   Drug use: No   Sexual activity: Not  on file  Other Topics Concern   Not on file  Social History Narrative   Not on file   Social Determinants of Health   Financial Resource Strain: Low Risk  (06/01/2023)   Overall Financial Resource Strain (CARDIA)    Difficulty of Paying Living Expenses: Not very hard  Food Insecurity: No Food Insecurity (06/01/2023)   Hunger Vital Sign    Worried About Running Out of Food in the Last Year: Never true    Ran Out of Food in the Last Year: Never true  Transportation Needs: No Transportation Needs (06/01/2023)   PRAPARE - Administrator, Civil Service (Medical): No    Lack of Transportation (Non-Medical): No  Physical Activity: Sufficiently Active (06/01/2023)   Exercise Vital Sign    Days of Exercise per Week: 4 days    Minutes of Exercise per Session: 90 min  Stress: No Stress Concern Present (06/01/2023)   [employer] of Occupational Health - Occupational Stress Questionnaire    Feeling of Stress : Only a little  Social Connections: Moderately Integrated (06/01/2023)   Social Connection and Isolation Panel [NHANES]    Frequency of Communication with Friends and Family: More than three times a week    Frequency of Social Gatherings with Friends and Family: More than three times a week    Attends Religious Services: More than 4 times per year    Active Member of  Clubs or Organizations: Yes    Attends Engineer, structural: More than 4 times per year    Marital Status: Divorced   No Known Allergies Family History  Problem Relation Age of Onset   Hypertension Mother    Atrial fibrillation Mother    Cancer Mother        melanoma    Cancer Father        colon    Heart disease Father    Breast cancer Neg Hx      Current Outpatient Medications (Cardiovascular):    rosuvastatin (CRESTOR) 40 MG tablet, Take 1 tablet (40 mg total) by mouth daily.  Current Outpatient Medications (Respiratory):    albuterol (VENTOLIN HFA) 108 (90 Base) MCG/ACT inhaler, Inhale  2 puffs into the lungs every 6 (six) hours as needed for wheezing or shortness of breath.   Chlorphen-PE-Acetaminophen 4-10-325 MG TABS, Take 1 tablet by mouth every 6 (six) hours as needed.   fluticasone (FLONASE) 50 MCG/ACT nasal spray, Place 2 sprays into both nostrils daily.  Current Outpatient Medications (Analgesics):    meloxicam (MOBIC) 15 MG tablet, Take 1 tablet (15 mg total) by mouth daily.   Current Outpatient Medications (Other):    Doxycycline, Rosacea, (ORACEA) 40 MG CPDR, Take 40 mg by mouth daily.   pantoprazole (PROTONIX) 40 MG tablet, Take 1 tablet (40 mg total) by mouth daily.   valACYclovir (VALTREX) 1000 MG tablet, Take 1 tablet (1,000 mg total) by mouth daily.   zolpidem (AMBIEN) 5 MG tablet, Take 1 tablet (5 mg total) by mouth at bedtime as needed. for sleep   Reviewed prior external information including notes and imaging from  primary care provider As well as notes that were available from care everywhere and other healthcare systems.  Past medical history, social, surgical and family history all reviewed in electronic medical record.  No pertanent information unless stated regarding to the chief complaint.   Review of Systems:  No headache, visual changes, nausea, vomiting, diarrhea, constipation, dizziness, abdominal pain, skin rash, fevers, chills, night sweats, weight loss, swollen lymph nodes, body aches, joint swelling, chest pain, shortness of breath, mood changes. POSITIVE muscle aches  Objective  Blood pressure 128/88, pulse 60, height 5\' 5"  (1.651 m), weight 166 lb (75.3 kg), SpO2 98%.   General: No apparent distress alert and oriented x3 mood and affect normal, dressed appropriately.  HEENT: Pupils equal, extraocular movements intact  Respiratory: Patient's speak in full sentences and does not appear short of breath  Cardiovascular: No lower extremity edema, non tender, no erythema  Shoulder exam shows the patient does have some tightness  noted. Patient does still have some weakness noted of the rotator cuff as well.    Impression and Recommendations:     The above documentation has been reviewed and is accurate and complete Judi Saa, DO

## 2023-06-24 ENCOUNTER — Other Ambulatory Visit: Payer: Self-pay

## 2023-06-24 ENCOUNTER — Ambulatory Visit: Payer: BC Managed Care – PPO | Admitting: Family Medicine

## 2023-06-24 ENCOUNTER — Encounter: Payer: Self-pay | Admitting: Family Medicine

## 2023-06-24 VITALS — BP 128/88 | HR 60 | Ht 65.0 in | Wt 166.0 lb

## 2023-06-24 DIAGNOSIS — M75121 Complete rotator cuff tear or rupture of right shoulder, not specified as traumatic: Secondary | ICD-10-CM

## 2023-06-24 DIAGNOSIS — M25511 Pain in right shoulder: Secondary | ICD-10-CM | POA: Diagnosis not present

## 2023-06-24 DIAGNOSIS — M75101 Unspecified rotator cuff tear or rupture of right shoulder, not specified as traumatic: Secondary | ICD-10-CM | POA: Insufficient documentation

## 2023-06-24 NOTE — Assessment & Plan Note (Signed)
Patient's MRI unfortunately does show that she has a near complete tear noted with no significant retraction.  Patient is highly active and would like to continue to do so.  I think at this point secondary to patient having multiple different tears including the supraspinatus, infraspinatus as well as the bicep tendon when reviewing the MRI I would like patient to check with the surgeon to see if this would be beneficial.  Failed all other conservative therapy at this time.

## 2023-06-24 NOTE — Patient Instructions (Signed)
Dr. Kristeen Miss Ortho See where you end up after speaking with Dr. Ave Filter Write [place] with questions

## 2023-06-30 ENCOUNTER — Telehealth: Payer: BC Managed Care – PPO

## 2023-06-30 ENCOUNTER — Encounter: Payer: Self-pay | Admitting: Family

## 2023-06-30 DIAGNOSIS — R109 Unspecified abdominal pain: Secondary | ICD-10-CM

## 2023-06-30 NOTE — Progress Notes (Signed)
Virtual Visit Consent   Robin Harrington, you are scheduled for a virtual visit with a  provider today. Just as with appointments in the office, your consent must be obtained to participate. Your consent will be active for this visit and any virtual visit you may have with one of our providers in the next 365 days. If you have a MyChart account, a copy of this consent can be sent to you electronically.  As this is a virtual visit, video technology does not allow for your provider to perform a traditional examination. This may limit your provider's ability to fully assess your condition. If your provider identifies any concerns that need to be evaluated in person or the need to arrange testing (such as labs, EKG, etc.), we will make arrangements to do so. Although advances in technology are sophisticated, we cannot ensure that it will always work on either your end or our end. If the connection with a video visit is poor, the visit may have to be switched to a telephone visit. With either a video or telephone visit, we are not always able to ensure that we have a secure connection.  By engaging in this virtual visit, you consent to the provision of healthcare and authorize for your insurance to be billed (if applicable) for the services provided during this visit. Depending on your insurance coverage, you may receive a charge related to this service.  I need to obtain your verbal consent now. Are you willing to proceed with your visit today? Robin Harrington has provided verbal consent on 06/30/2023 for a virtual visit (video or telephone). Jannifer Rodney, FNP  Date: 06/30/2023 12:26 PM  Virtual Visit via Video Note   I, Jannifer Rodney, connected with  Robin Harrington  ([id number], 05-01-1966) on 06/30/23 at  5:00 PM EDT by a video-enabled telemedicine application and verified that I am speaking with the correct person using two identifiers.  Location: Patient: Virtual Visit Location Patient: Other:  work Provider: Pharmacist, community: Office/Clinic   I discussed the limitations of evaluation and management by telemedicine and the availability of in person appointments. The patient expressed understanding and agreed to proceed.    History of Present Illness: Robin Harrington is a 57 y.o. who identifies as a female who was assigned female at birth, and is being seen today for abdominal pressure that comes and goes over the last few months. Denies any fevers, dysuria, frequency, or hematuria.   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   Mixed hyperlipidemia 09/07/2016   Body mass index 30.0-30.9, adult 09/07/2016   Acne 09/07/2016    Allergies: No Known Allergies Medications:  Current Outpatient Medications:    albuterol (VENTOLIN HFA) 108 (90 Base) MCG/ACT inhaler, Inhale 2 puffs into the lungs every 6 (six) hours as needed for wheezing or shortness of breath., Disp: 8 g, Rfl: 2   Chlorphen-PE-Acetaminophen  4-10-325 MG TABS, Take 1 tablet by mouth every 6 (six) hours as needed., Disp: 20 tablet, Rfl: 0   Doxycycline, Rosacea, (ORACEA) 40 MG CPDR, Take 40 mg by mouth daily., Disp: 90 capsule, Rfl: 1   fluticasone (FLONASE) 50 MCG/ACT nasal spray, Place 2 sprays into both nostrils daily., Disp: 16 g, Rfl: 1   meloxicam (MOBIC) 15 MG tablet, Take 1 tablet (15 mg total) by mouth daily., Disp: 90  tablet, Rfl: 1   pantoprazole (PROTONIX) 40 MG tablet, Take 1 tablet (40 mg total) by mouth daily., Disp: 90 tablet, Rfl: 3   rosuvastatin (CRESTOR) 40 MG tablet, Take 1 tablet (40 mg total) by mouth daily., Disp: 90 tablet, Rfl: 3   valACYclovir (VALTREX) 1000 MG tablet, Take 1 tablet (1,000 mg total) by mouth daily., Disp: 30 tablet, Rfl: 11   zolpidem (AMBIEN) 5 MG tablet, Take 1 tablet (5 mg total) by mouth at bedtime as needed. for sleep, Disp: 15 tablet, Rfl: 0  Observations/Objective: Patient is well-developed, well-nourished in no acute distress.  Resting comfortably   Head is normocephalic, atraumatic.  No labored breathing.  Speech is clear and coherent with logical content.  Patient is alert and oriented at baseline.    Assessment and Plan: 1. Abdominal pressure - Urinalysis, Complete - Urine Culture  Urine pending If negative will do referral to Urologists given symptoms on and off for months.  Force fluids  Limit caffeine  Follow up if symptoms do not improve or worsen  Follow Up Instructions: I discussed the assessment and treatment plan with the patient. The patient was provided an opportunity to ask questions and all were answered. The patient agreed with the plan and demonstrated an understanding of the instructions.  A copy of instructions were sent to the patient via MyChart unless otherwise noted below.     The patient was advised to call back or seek an in-person evaluation if the symptoms worsen or if the condition fails to improve as anticipated.  Time:  I spent 6 minutes with the patient via telehealth technology discussing the above problems/concerns.    Jannifer Rodney, FNP

## 2023-07-01 LAB — URINALYSIS, COMPLETE
Bilirubin, UA: NEGATIVE
Glucose, UA: NEGATIVE
Leukocytes,UA: NEGATIVE
Nitrite, UA: NEGATIVE
RBC, UA: NEGATIVE
Specific Gravity, UA: >1.03 — ABNORMAL HIGH (ref 1.005–1.030)
Urobilinogen, Ur: 0.2 mg/dL (ref 0.2–1.0)
pH, UA: 5.5 (ref 5.0–7.5)

## 2023-07-03 ENCOUNTER — Other Ambulatory Visit: Payer: Self-pay | Admitting: Family

## 2023-07-03 LAB — URINE CULTURE

## 2023-07-03 MED ORDER — CEPHALEXIN 500 MG PO CAPS
500.0000 mg | ORAL_CAPSULE | Freq: Two times a day (BID) | ORAL | 0 refills | Status: DC
Start: 2023-07-03 — End: 2023-09-16

## 2023-07-07 ENCOUNTER — Other Ambulatory Visit: Payer: BC Managed Care – PPO

## 2023-08-05 ENCOUNTER — Ambulatory Visit: Payer: BC Managed Care – PPO | Attending: Orthopedic Surgery

## 2023-08-05 ENCOUNTER — Other Ambulatory Visit: Payer: Self-pay

## 2023-08-05 DIAGNOSIS — M25611 Stiffness of right shoulder, not elsewhere classified: Secondary | ICD-10-CM | POA: Diagnosis present

## 2023-08-05 DIAGNOSIS — M6281 Muscle weakness (generalized): Secondary | ICD-10-CM | POA: Diagnosis present

## 2023-08-05 DIAGNOSIS — M25511 Pain in right shoulder: Secondary | ICD-10-CM | POA: Diagnosis present

## 2023-08-05 NOTE — Therapy (Signed)
OUTPATIENT PHYSICAL THERAPY SHOULDER EVALUATION   Patient Name: Robin Harrington MRN: [id number] DOB:1965-12-09, 57 y.o., female Today's Date: 08/05/2023  END OF SESSION:  PT End of Session - 08/05/23 0801     Visit Number 1    Number of Visits 15    Date for PT Re-Evaluation 09/18/23    PT Start Time 0802    PT Stop Time 0846    PT Time Calculation (min) 44 min    Activity Tolerance Patient tolerated treatment well    Behavior During Therapy [facility] for tasks assessed/performed             Past Medical History:  Diagnosis Date   Acne    Allergy    Hyperlipidemia    History reviewed. No pertinent surgical history. 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   Mixed hyperlipidemia 09/07/2016   Body mass index 30.0-30.9, adult 09/07/2016   Acne 09/07/2016   REFERRING PROVIDER: Jones Broom, MD  REFERRING DIAG: s/p Right Shoulder Scope  THERAPY DIAG:  Acute pain of right shoulder  Stiffness of right shoulder, not elsewhere classified  Muscle weakness (generalized)  Rationale for Evaluation and Treatment: Rehabilitation  ONSET DATE: 07/21/23  SUBJECTIVE:                                                                                                                                                                                      SUBJECTIVE STATEMENT: Patient reports that she had a right shoulder  scope on 07/21/23. She notes that she had one day of really bad pain, but it has been better since then. She has been taking her arm out of the sling to move her elbow and hand about 2-3 times per day. She is having to sleep sitting up to avoid causing pain due to her moving.  Hand dominance: Right  PERTINENT HISTORY: Arthritis and allergies  PAIN:  Are you having pain? Yes: NPRS scale: 3-4/10 Pain location: right shoulder Pain description: intermittent ache Aggravating factors: any arm movement Relieving factors: medication  PRECAUTIONS: Shoulder  RED FLAGS: None   WEIGHT BEARING  RESTRICTIONS: Yes see protocol  FALLS:  Has patient fallen in last 6 months? 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  PATIENT SURVEYS:  FOTO 19.06  COGNITION: Overall cognitive status: Within functional limits for tasks assessed     SENSATION: Patient reports intermittent numbness and tingling   POSTURE: Forward head and rounded shoulders with RUE in sling   UPPER EXTREMITY ROM:   Active ROM Right eval (PROM) Left eval  Shoulder flexion 35 157  Shoulder extension    Shoulder abduction 30 154  Shoulder adduction    Shoulder internal rotation To abdomen To T7  Shoulder external rotation 5 To T4  Elbow flexion    Elbow extension    Wrist flexion    Wrist extension    Wrist ulnar deviation    Wrist radial deviation    Wrist pronation    Wrist supination    (Blank rows = not tested)  UPPER EXTREMITY MMT: Not tested due to surgical condition  SHOULDER SPECIAL TESTS: Not tested due to surgical condition  JOINT MOBILITY TESTING:  Unable to assess due to  pain   PALPATION:  No tenderness to palpation reported   TODAY'S TREATMENT:                                                                                                                                         DATE:                                     EXERCISE LOG  Exercise Repetitions and Resistance Comments  Pendulums   Forward and lateral   Scapular retraction                 Blank cell = exercise not performed today   PATIENT EDUCATION: Education details: HEP, protocol, safety, anatomy, healing, prognosis, plan of care, and goals for physical therapy Person educated: Patient Education method: Explanation Education comprehension: verbalized understanding  HOME EXERCISE PROGRAM: Pendulums and scapular retractions were added to her HEP. She was able to properly demonstrate these interventions. She declined a handout of these interventions.   ASSESSMENT:  CLINICAL IMPRESSION: Patient is a 57 y.o. female who was seen today for physical therapy evaluation and treatment following a right shoulder scope on 07/21/23. She presented with low to moderate pain severity and irritability with right shoulder passive range of motion being the most aggravating to her familiar symptoms. She exhibited reduced right shoulder passive range of motion with pain being her primary limiting factor. She  was provided a home exercise program which included scapular retraction and pendulums which she was able to properly demonstrate. She reported feeling comfortable with these interventions. Recommend that she continue with skilled physical therapy to address her impairments to return to her prior level of function.  OBJECTIVE IMPAIRMENTS: decreased activity tolerance, decreased knowledge of condition, decreased mobility, decreased ROM, decreased strength, hypomobility, impaired sensation, impaired tone, impaired UE functional use, postural dysfunction, and pain.   ACTIVITY LIMITATIONS: carrying, lifting,  sleeping, bathing, toileting, dressing, reach over head, and hygiene/grooming  PARTICIPATION LIMITATIONS: meal prep, cleaning, laundry, driving, shopping, community activity, occupation, and yard work  PERSONAL FACTORS: Behavior pattern and 1-2 comorbidities: Arthritis and allergies  are also affecting patient's functional outcome.   REHAB POTENTIAL: Good  CLINICAL DECISION MAKING: Stable/uncomplicated  EVALUATION COMPLEXITY: Low   GOALS: Goals reviewed with patient? Yes  SHORT TERM GOALS: Target date: 08/26/23  Patient will be independent with her initial HEP. Baseline: Goal status: INITIAL  2.  Patient will improve her right shoulder passive range of motion to at least 90 degrees for improved shoulder mobility. Baseline:  Goal status: INITIAL  3.  Patient will be able to demonstrate at least 20 degrees of passive right shoulder external rotation for improved shoulder mobility Baseline:  Goal status: INITIAL  LONG TERM GOALS: Target date: 09/16/23  Patient will be independent with her advanced HEP. Baseline:  Goal status: INITIAL  2.  Patient will be able to demonstrate at least 90 degrees of active right shoulder flexion for improved functional reaching. Baseline:  Goal status: INITIAL  3.  Patient will report being able to get dressed with minimal to no difficulty. Baseline:  Goal status: INITIAL  4.  Patient will be able to demonstrate at least 90 degrees of active right shoulder abduction for improved function reaching. Baseline:  Goal status: INITIAL  Goals will be updated and modified as needed throughout treatment as appropriate per her surgical protocol  PLAN:  PT FREQUENCY: 2-3x/week  PT DURATION: 6 weeks  PLANNED INTERVENTIONS: 97164- PT Re-evaluation, 97110-Therapeutic exercises, 97530- Therapeutic activity, 97112- Neuromuscular re-education, 97535- Self Care, 16109- Manual therapy, 97014- Electrical stimulation (unattended), 97016- Vasopneumatic  device, Patient/Family education, Taping, Joint mobilization, Cryotherapy, and Moist heat  PLAN FOR NEXT SESSION: see surgical protocol   Granville Lewis, PT 08/05/2023, 4:57 PM

## 2023-08-11 ENCOUNTER — Ambulatory Visit: Payer: BC Managed Care – PPO | Admitting: *Deleted

## 2023-08-11 DIAGNOSIS — M25511 Pain in right shoulder: Secondary | ICD-10-CM | POA: Diagnosis not present

## 2023-08-11 DIAGNOSIS — M6281 Muscle weakness (generalized): Secondary | ICD-10-CM

## 2023-08-11 DIAGNOSIS — M25611 Stiffness of right shoulder, not elsewhere classified: Secondary | ICD-10-CM

## 2023-08-11 NOTE — Therapy (Signed)
OUTPATIENT PHYSICAL THERAPY SHOULDER EVALUATION   Patient Name: Robin Harrington MRN: [id number] DOB:Apr 16, 1966, 57 y.o., female Today's Date: 08/11/2023  END OF SESSION:  PT End of Session - 08/11/23 1638     Visit Number 2    Number of Visits 15    Date for PT Re-Evaluation 09/18/23    PT Start Time 1638    PT Stop Time 1733    PT Time Calculation (min) 55 min             Past Medical History:  Diagnosis Date   Acne    Allergy    Hyperlipidemia    No past surgical history on file. 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   Mixed hyperlipidemia 09/07/2016   Body mass index 30.0-30.9, adult 09/07/2016   Acne 09/07/2016   REFERRING PROVIDER: Jones Broom, MD  REFERRING DIAG: s/p Right Shoulder Scope  THERAPY DIAG:  Acute pain of right shoulder  Stiffness of right shoulder, not elsewhere classified  Muscle weakness (generalized)  Rationale for Evaluation and Treatment: Rehabilitation  ONSET DATE: 07/21/23  SUBJECTIVE:                                                                                                                                                                                      SUBJECTIVE STATEMENT: Patient reports that she had a right shoulder scope on 07/21/23. RT shldr pain today   3-4/10   Hand dominance: Right  PERTINENT HISTORY: Arthritis and allergies  PAIN:  Are  you having pain? Yes: NPRS scale: 3-4/10 Pain location: right shoulder Pain description: intermittent ache Aggravating factors: any arm movement Relieving factors: medication  PRECAUTIONS: Shoulder  RED FLAGS: None   WEIGHT BEARING RESTRICTIONS: Yes see protocol  FALLS:  Has patient fallen in last 6 months? 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  PATIENT SURVEYS:  FOTO 19.06  COGNITION: Overall cognitive status: Within functional limits for tasks assessed     SENSATION: Patient reports intermittent numbness and tingling   POSTURE: Forward head and rounded shoulders with RUE in sling   UPPER EXTREMITY ROM:   Active ROM Right eval (PROM) Left eval  Shoulder flexion 35 157  Shoulder extension    Shoulder abduction 30 154  Shoulder adduction    Shoulder internal rotation To abdomen To T7  Shoulder external rotation 5 To T4  Elbow flexion    Elbow extension    Wrist flexion    Wrist extension    Wrist ulnar deviation    Wrist radial deviation    Wrist pronation    Wrist supination    (Blank rows = not tested)  UPPER EXTREMITY MMT: Not tested due to surgical condition  SHOULDER SPECIAL TESTS: Not tested due to surgical condition  JOINT MOBILITY TESTING:  Unable to assess due to pain   PALPATION:  No tenderness to palpation reported   TODAY'S TREATMENT:                                                                                                                                         DATE:                                                             08/11/23                                    EXERCISE LOG    RT shldr SX  07-21-23  Exercise Repetitions and Resistance Comments  Pendulums   Forward and lateral   Scapular retraction                 Blank cell = exercise not performed today   Manual PROM to RT shldr flexion ABD, and ER   Vaso x 15 mins to RT shldr seated and propped on pillows for comfort    PATIENT EDUCATION: Education details: HEP, protocol, safety, anatomy, healing, prognosis, plan of care, and goals for physical therapy Person educated: Patient Education method: Explanation Education comprehension: verbalized understanding  HOME EXERCISE PROGRAM: Pendulums and scapular retractions were added to her HEP. She was able to properly demonstrate these interventions. She declined a handout of these interventions.   ASSESSMENT:  CLINICAL IMPRESSION: Pt arrived today doing fair with RT shldr pain 3-4/10. Rx focused on PROM for RT shldr elevation and ER. Flexion to 90, ER to 10 degrees today. Pt needing v/c's to relax during PROM.  Vaso end of session tolerated well..  OBJECTIVE IMPAIRMENTS: decreased activity tolerance, decreased knowledge of condition, decreased mobility, decreased ROM, decreased strength, hypomobility, impaired sensation, impaired tone, impaired UE functional use, postural dysfunction, and pain.   ACTIVITY LIMITATIONS: carrying, lifting, sleeping, bathing, toileting, dressing, reach over head, and hygiene/grooming  PARTICIPATION LIMITATIONS: meal prep, cleaning, laundry, driving, shopping, community activity, occupation, and yard work  PERSONAL FACTORS: Behavior pattern and 1-2 comorbidities: Arthritis and allergies  are also affecting patient's functional outcome.   REHAB POTENTIAL: Good  CLINICAL DECISION MAKING: Stable/uncomplicated  EVALUATION COMPLEXITY: Low   GOALS: Goals reviewed with patient? Yes  SHORT TERM GOALS: Target date: 08/26/23  Patient will be independent with her initial HEP. Baseline: Goal status: INITIAL  2.  Patient will improve her  right shoulder passive range of motion to at least 90 degrees for improved shoulder mobility. Baseline:  Goal status: INITIAL  3.  Patient will be able to demonstrate at least 20 degrees of passive right shoulder external rotation for improved shoulder mobility Baseline:  Goal status: INITIAL  LONG TERM GOALS: Target date: 09/16/23  Patient will be independent with her advanced HEP. Baseline:  Goal status: INITIAL  2.  Patient will be able to demonstrate at least 90 degrees of active right shoulder flexion for improved functional reaching. Baseline:  Goal status: INITIAL  3.  Patient will report being able to get dressed with minimal to no difficulty. Baseline:  Goal status: INITIAL  4.  Patient will be able to demonstrate at least 90 degrees of active right shoulder abduction for improved function reaching. Baseline:  Goal status: INITIAL  Goals will be updated and modified as needed throughout treatment as appropriate per her surgical protocol  PLAN:  PT FREQUENCY: 2-3x/week  PT DURATION: 6 weeks  PLANNED INTERVENTIONS: 97164- PT Re-evaluation, 97110-Therapeutic exercises, 97530- Therapeutic activity, 97112- Neuromuscular re-education, 97535- Self Care, 38756- Manual therapy, 97014- Electrical stimulation (unattended), 97016- Vasopneumatic device, Patient/Family education, Taping, Joint mobilization, Cryotherapy, and Moist heat  PLAN FOR NEXT SESSION: see surgical protocol   Pammie Chirino,CHRIS, PTA 08/11/2023, 5:47 PM

## 2023-08-13 ENCOUNTER — Encounter: Payer: Self-pay | Admitting: *Deleted

## 2023-08-13 ENCOUNTER — Ambulatory Visit: Payer: BC Managed Care – PPO | Admitting: *Deleted

## 2023-08-13 DIAGNOSIS — M25511 Pain in right shoulder: Secondary | ICD-10-CM

## 2023-08-13 DIAGNOSIS — M25611 Stiffness of right shoulder, not elsewhere classified: Secondary | ICD-10-CM

## 2023-08-13 DIAGNOSIS — M6281 Muscle weakness (generalized): Secondary | ICD-10-CM

## 2023-08-13 NOTE — Therapy (Signed)
OUTPATIENT PHYSICAL THERAPY SHOULDER EVALUATION   Patient Name: Robin Harrington MRN: [id number] DOB:01-26-1966, 57 y.o., female Today's Date: 08/13/2023  END OF SESSION:  PT End of Session - 08/13/23 1645     Visit Number 3    Number of Visits 15    Date for PT Re-Evaluation 09/18/23    PT Start Time 1645    PT Stop Time 1735    PT Time Calculation (min) 50 min             Past Medical History:  Diagnosis Date   Acne    Allergy    Hyperlipidemia    History reviewed. No pertinent surgical history. 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   Mixed hyperlipidemia 09/07/2016   Body mass index 30.0-30.9, adult 09/07/2016   Acne 09/07/2016   REFERRING PROVIDER: Jones Broom, MD  REFERRING DIAG: s/p Right Shoulder Scope  THERAPY DIAG:  Stiffness of right shoulder, not elsewhere classified  Acute pain of right shoulder  Muscle weakness (generalized)  Rationale for Evaluation and Treatment: Rehabilitation  ONSET DATE: 07/21/23  SUBJECTIVE:                                                                                                                                                                                      SUBJECTIVE STATEMENT: RT shldr pain today   3/10   Hand dominance: Right  PERTINENT HISTORY: Arthritis and allergies  PAIN:  Are you having pain? Yes: NPRS scale: 3/10 Pain  location: right shoulder Pain description: intermittent ache Aggravating factors: any arm movement Relieving factors: medication  PRECAUTIONS: Shoulder  RED FLAGS: None   WEIGHT BEARING RESTRICTIONS: Yes see protocol  FALLS:  Has patient fallen in last 6 months? 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  PATIENT  SURVEYS:  FOTO 19.06  COGNITION: Overall cognitive status: Within functional limits for tasks assessed     SENSATION: Patient reports intermittent numbness and tingling   POSTURE: Forward head and rounded shoulders with RUE in sling   UPPER EXTREMITY ROM:   Active ROM Right eval (PROM) Left eval  Shoulder flexion 35 157  Shoulder extension    Shoulder abduction 30 154  Shoulder adduction    Shoulder internal rotation To abdomen To T7  Shoulder external rotation 5 To T4  Elbow flexion    Elbow extension    Wrist flexion    Wrist extension    Wrist ulnar deviation    Wrist radial deviation    Wrist pronation    Wrist supination    (Blank rows = not tested)  UPPER EXTREMITY MMT: Not tested due to surgical condition  SHOULDER SPECIAL TESTS: Not tested due to surgical condition  JOINT MOBILITY TESTING:  Unable to assess due to pain   PALPATION:  No tenderness to palpation reported   TODAY'S TREATMENT:                                                                                                                                         DATE:                                                             08/13/23                                    EXERCISE LOG    RT shldr SX 07-21-23  Exercise Repetitions and Resistance  Comments  Pendulums   Forward and lateral   Scapular retraction    Supine cane ER stretch   HEP x 10 hold 10 secs            Blank cell = exercise not performed today   Manual PROM to RT shldr flexion ABD, and ER   Vaso x 15 mins to RT shldr seated and propped on pillows for comfort    PATIENT EDUCATION: Education details: See below Person educated: Patient,  Education method: Programmer, multimedia, Demo,  Education comprehension: verbalized understanding, handout  HOME EXERCISE PROGRAM: WAND EXTERNAL ROTATION - SUPINE ER  Lie on your back holding a cane or wand with both hands.  On the affected side, place a small rolled up towel or pillow under your elbow. Maintain approx. 90 degree bend at the elbow with your arm approximately 30-45 degrees away from your side.  Use your other arm to pull the wand/cane to rotate the affected arm back into a stretch. Hold and then  return to starting position and then repeat.   Video # [id number]  Repeat 10 Times Hold 10 Seconds Complete 2 Sets Perform 3 Times a Day   ASSESSMENT:  CLINICAL IMPRESSION: Pt arrived today doing fair with RT shldr pain 3-4/10 and did well after last session. Rx again focused on PROM for RT shldr elevation and ER. Flexion to 90, ER to 25 degrees today. Pt needing v/c's to relax during PROM. Vaso end of session tolerated well..  OBJECTIVE IMPAIRMENTS: decreased activity tolerance, decreased knowledge of condition, decreased mobility, decreased ROM, decreased strength, hypomobility, impaired sensation, impaired tone, impaired UE functional use, postural dysfunction, and pain.   ACTIVITY LIMITATIONS: carrying, lifting, sleeping, bathing, toileting, dressing, reach over head, and hygiene/grooming  PARTICIPATION LIMITATIONS: meal prep, cleaning, laundry, driving, shopping, community activity, occupation, and yard work  PERSONAL FACTORS: Behavior pattern and 1-2 comorbidities: Arthritis and allergies  are also affecting  patient's functional outcome.   REHAB POTENTIAL: Good  CLINICAL DECISION MAKING: Stable/uncomplicated  EVALUATION COMPLEXITY: Low   GOALS: Goals reviewed with patient? Yes  SHORT TERM GOALS: Target date: 08/26/23  Patient will be independent with her initial HEP. Baseline: Goal status: INITIAL  2.  Patient will improve her right shoulder passive range of motion to at least 90 degrees for improved shoulder mobility. Baseline:  Goal status: INITIAL  3.  Patient will be able to demonstrate at least 20 degrees of passive right shoulder external rotation for improved shoulder mobility Baseline:  Goal status: INITIAL  LONG TERM GOALS: Target date: 09/16/23  Patient will be independent with her advanced HEP. Baseline:  Goal status: INITIAL  2.  Patient will be able to demonstrate at least 90 degrees of active right shoulder flexion for improved functional reaching. Baseline:  Goal status: INITIAL  3.  Patient will report being able to get dressed with minimal to no difficulty. Baseline:  Goal status: INITIAL  4.  Patient will be able to demonstrate at least 90 degrees of active right shoulder abduction for improved function reaching. Baseline:  Goal status: INITIAL  Goals will be updated and modified as needed throughout treatment as appropriate per her surgical protocol  PLAN:  PT FREQUENCY: 2-3x/week  PT DURATION: 6 weeks  PLANNED INTERVENTIONS: 97164- PT Re-evaluation, 97110-Therapeutic exercises, 97530- Therapeutic activity, 97112- Neuromuscular re-education, 97535- Self Care, 40981- Manual therapy, 97014- Electrical stimulation (unattended), 97016- Vasopneumatic device, Patient/Family education, Taping, Joint mobilization, Cryotherapy, and Moist heat  PLAN FOR NEXT SESSION: see surgical protocol   James Lafalce,CHRIS, PTA 08/13/2023, 5:49 PM

## 2023-08-18 ENCOUNTER — Ambulatory Visit: Payer: BC Managed Care – PPO | Admitting: *Deleted

## 2023-08-18 ENCOUNTER — Encounter: Payer: Self-pay | Admitting: *Deleted

## 2023-08-18 DIAGNOSIS — M25511 Pain in right shoulder: Secondary | ICD-10-CM

## 2023-08-18 DIAGNOSIS — M6281 Muscle weakness (generalized): Secondary | ICD-10-CM

## 2023-08-18 DIAGNOSIS — M25611 Stiffness of right shoulder, not elsewhere classified: Secondary | ICD-10-CM

## 2023-08-18 NOTE — Therapy (Signed)
OUTPATIENT PHYSICAL THERAPY SHOULDER EVALUATION   Patient Name: Robin Harrington MRN: [id number] DOB:1966/05/08, 57 y.o., female Today's Date: 08/18/2023  END OF SESSION:  PT End of Session - 08/18/23 1724     Visit Number 4    Number of Visits 15    Date for PT Re-Evaluation 09/18/23    PT Start Time 1645    PT Stop Time 1736    PT Time Calculation (min) 51 min             Past Medical History:  Diagnosis Date   Acne    Allergy    Hyperlipidemia    History reviewed. No pertinent surgical history. 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   Mixed hyperlipidemia 09/07/2016   Body mass index 30.0-30.9, adult 09/07/2016   Acne 09/07/2016   REFERRING PROVIDER: Jones Broom, MD  REFERRING DIAG: s/p Right Shoulder Scope  THERAPY DIAG:  Stiffness of right shoulder, not elsewhere classified  Acute pain of right shoulder  Muscle weakness (generalized)  Rationale for Evaluation and Treatment: Rehabilitation  ONSET DATE: 07/21/23  SUBJECTIVE:                                                                                                                                                                                      SUBJECTIVE STATEMENT: RT shldr pain today   3/10   Hand dominance: Right  PERTINENT HISTORY: Arthritis and allergies  PAIN:  Are you having pain? Yes: NPRS scale: 3/10 Pain  location: right shoulder Pain description: intermittent ache Aggravating factors: any arm movement Relieving factors: medication  PRECAUTIONS: Shoulder  RED FLAGS: None   WEIGHT BEARING RESTRICTIONS: Yes see protocol  FALLS:  Has patient fallen in last 6 months? 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  PATIENT  SURVEYS:  FOTO 19.06  COGNITION: Overall cognitive status: Within functional limits for tasks assessed     SENSATION: Patient reports intermittent numbness and tingling   POSTURE: Forward head and rounded shoulders with RUE in sling   UPPER EXTREMITY ROM:   Active ROM Right eval (PROM) Left eval  Shoulder flexion 35 157  Shoulder extension    Shoulder abduction 30 154  Shoulder adduction    Shoulder internal rotation To abdomen To T7  Shoulder external rotation 5 To T4  Elbow flexion    Elbow extension    Wrist flexion    Wrist extension    Wrist ulnar deviation    Wrist radial deviation    Wrist pronation    Wrist supination    (Blank rows = not tested)  UPPER EXTREMITY MMT: Not tested due to surgical condition  SHOULDER SPECIAL TESTS: Not tested due to surgical condition  JOINT MOBILITY TESTING:  Unable to assess due to pain   PALPATION:  No tenderness to palpation reported   TODAY'S TREATMENT:                                                                                                                                         DATE:                                                             08/18/23                                    EXERCISE LOG    RT shldr SX 07-21-23  Exercise Repetitions and Resistance  Comments  Pendulums   Forward and lateral   Scapular retraction    Supine cane ER stretch   HEP x 10 hold 10 secs            Blank cell = exercise not performed today   Manual PROM to RT shldr flexion ABD, and ER   Vaso x 15 mins to RT shldr seated and propped on pillows for comfort    PATIENT EDUCATION: Education details: See below Person educated: Patient,  Education method: Programmer, multimedia, Demo,  Education comprehension: verbalized understanding, handout  HOME EXERCISE PROGRAM: WAND EXTERNAL ROTATION - SUPINE ER  Lie on your back holding a cane or wand with both hands.  On the affected side, place a small rolled up towel or pillow under your elbow. Maintain approx. 90 degree bend at the elbow with your arm approximately 30-45 degrees away from your side.  Use your other arm to pull the wand/cane to rotate the affected arm back into a stretch. Hold and then  return to starting position and then repeat.   Video # [id number]  Repeat 10 Times Hold 10 Seconds Complete 2 Sets Perform 3 Times a Day   ASSESSMENT:  CLINICAL IMPRESSION: Pt arrived today doing fair with RT shldr  and pain 3-4/10  Rx again focused on PROM for RT shldr elevation and ER. Flexion to 100, ER to 30 degrees today. Pt needing v/c's to relax during PROM. Vaso end of session tolerated well..STG's met today  OBJECTIVE IMPAIRMENTS: decreased activity tolerance, decreased knowledge of condition, decreased mobility, decreased ROM, decreased strength, hypomobility, impaired sensation, impaired tone, impaired UE functional use, postural dysfunction, and pain.   ACTIVITY LIMITATIONS: carrying, lifting, sleeping, bathing, toileting, dressing, reach over head, and hygiene/grooming  PARTICIPATION LIMITATIONS: meal prep, cleaning, laundry, driving, shopping, community activity, occupation, and yard work  PERSONAL FACTORS: Behavior pattern and 1-2 comorbidities: Arthritis and allergies  are also affecting patient's  functional outcome.   REHAB POTENTIAL: Good  CLINICAL DECISION MAKING: Stable/uncomplicated  EVALUATION COMPLEXITY: Low   GOALS: Goals reviewed with patient? Yes  SHORT TERM GOALS: Target date: 08/26/23  Patient will be independent with her initial HEP. Baseline: Goal status: MET  2.  Patient will improve her right shoulder passive range of motion to at least 90 degrees for improved shoulder mobility. Baseline:  Goal status: MET  3.  Patient will be able to demonstrate at least 20 degrees of passive right shoulder external rotation for improved shoulder mobility Baseline:  Goal status: MET  LONG TERM GOALS: Target date: 09/16/23  Patient will be independent with her advanced HEP. Baseline:  Goal status: INITIAL  2.  Patient will be able to demonstrate at least 90 degrees of active right shoulder flexion for improved functional reaching. Baseline:  Goal status: INITIAL  3.  Patient will report being able to get dressed with minimal to no difficulty. Baseline:  Goal status: INITIAL  4.  Patient will be able to demonstrate at least 90 degrees of active right shoulder abduction for improved function reaching. Baseline:  Goal status: INITIAL  Goals will be updated and modified as needed throughout treatment as appropriate per her surgical protocol  PLAN:  PT FREQUENCY: 2-3x/week  PT DURATION: 6 weeks  PLANNED INTERVENTIONS: 97164- PT Re-evaluation, 97110-Therapeutic exercises, 97530- Therapeutic activity, 97112- Neuromuscular re-education, 97535- Self Care, 16109- Manual therapy, 97014- Electrical stimulation (unattended), 97016- Vasopneumatic device, Patient/Family education, Taping, Joint mobilization, Cryotherapy, and Moist heat  PLAN FOR NEXT SESSION: see surgical protocol   Roquel Burgin,CHRIS, PTA 08/18/2023, 5:36 PM

## 2023-08-20 ENCOUNTER — Encounter: Payer: Self-pay | Admitting: *Deleted

## 2023-08-20 ENCOUNTER — Ambulatory Visit: Payer: BC Managed Care – PPO | Admitting: *Deleted

## 2023-08-20 DIAGNOSIS — M25611 Stiffness of right shoulder, not elsewhere classified: Secondary | ICD-10-CM

## 2023-08-20 DIAGNOSIS — M25511 Pain in right shoulder: Secondary | ICD-10-CM | POA: Diagnosis not present

## 2023-08-20 DIAGNOSIS — M6281 Muscle weakness (generalized): Secondary | ICD-10-CM

## 2023-08-20 NOTE — Therapy (Signed)
OUTPATIENT PHYSICAL THERAPY SHOULDER EVALUATION   Patient Name: Robin Harrington MRN: [id number] DOB:November 25, 1965, 57 y.o., female Today's Date: 08/20/2023  END OF SESSION:  PT End of Session - 08/20/23 1732     Visit Number 5    Number of Visits 15    Date for PT Re-Evaluation 09/18/23    PT Start Time 1645    PT Stop Time 1734    PT Time Calculation (min) 49 min             Past Medical History:  Diagnosis Date   Acne    Allergy    Hyperlipidemia    History reviewed. No pertinent surgical history. 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   Mixed hyperlipidemia 09/07/2016   Body mass index 30.0-30.9, adult 09/07/2016   Acne 09/07/2016   REFERRING PROVIDER: Jones Broom, MD  REFERRING DIAG: s/p Right Shoulder Scope  THERAPY DIAG:  Stiffness of right shoulder, not elsewhere classified  Acute pain of right shoulder  Muscle weakness (generalized)  Rationale for Evaluation and Treatment: Rehabilitation  ONSET DATE: 07/21/23  SUBJECTIVE:                                                                                                                                                                                      SUBJECTIVE STATEMENT: RT shldr pain today   3/10   Hand dominance: Right  PERTINENT HISTORY: Arthritis and allergies  PAIN:  Are you having pain? Yes: NPRS scale: 3/10 Pain  location: right shoulder Pain description: intermittent ache Aggravating factors: any arm movement Relieving factors: medication  PRECAUTIONS: Shoulder  RED FLAGS: None   WEIGHT BEARING RESTRICTIONS: Yes see protocol  FALLS:  Has patient fallen in last 6 months? 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  PATIENT  SURVEYS:  FOTO 19.06  COGNITION: Overall cognitive status: Within functional limits for tasks assessed     SENSATION: Patient reports intermittent numbness and tingling   POSTURE: Forward head and rounded shoulders with RUE in sling   UPPER EXTREMITY ROM:   Active ROM Right eval (PROM) Left eval  Shoulder flexion 35 157  Shoulder extension    Shoulder abduction 30 154  Shoulder adduction    Shoulder internal rotation To abdomen To T7  Shoulder external rotation 5 To T4  Elbow flexion    Elbow extension    Wrist flexion    Wrist extension    Wrist ulnar deviation    Wrist radial deviation    Wrist pronation    Wrist supination    (Blank rows = not tested)  UPPER EXTREMITY MMT: Not tested due to surgical condition  SHOULDER SPECIAL TESTS: Not tested due to surgical condition  JOINT MOBILITY TESTING:  Unable to assess due to pain   PALPATION:  No tenderness to palpation reported   TODAY'S TREATMENT:                                                                                                                                         DATE:                                                             08/20/23                                    EXERCISE LOG    RT shldr SX 07-21-23  Exercise Repetitions and Resistance  Comments  Pendulums   Forward and lateral   Scapular retraction    Supine cane ER stretch   HEP x 10 hold 10 secs            Blank cell = exercise not performed today   Manual PROM to RT shldr flexion ABD, and ER supine   Vaso x 15 mins to RT shldr seated and propped on pillows for comfort    PATIENT EDUCATION: Education details: See below Person educated: Patient,  Education method: Programmer, multimedia, Demo,  Education comprehension: verbalized understanding, handout  HOME EXERCISE PROGRAM: WAND EXTERNAL ROTATION - SUPINE ER  Lie on your back holding a cane or wand with both hands.  On the affected side, place a small rolled up towel or pillow under your elbow. Maintain approx. 90 degree bend at the elbow with your arm approximately 30-45 degrees away from your side.  Use your other arm to pull the wand/cane to rotate the affected arm back into a stretch. Hold and  then return to starting position and then repeat.   Video # [id number]  Repeat 10 Times Hold 10 Seconds Complete 2 Sets Perform 3 Times a Day   ASSESSMENT:  CLINICAL IMPRESSION: Pt arrived today doing fairly well with RT shldr after last Rx.  Rx focused on PROM for RT shldr elevation , ABD  and ER. Flexion to 110, ER to 45 degrees and ABD to 90 degrees today. Pt needing v/c's to relax during PROM, but much better. Vaso end of session tolerated well.  OBJECTIVE IMPAIRMENTS: decreased activity tolerance, decreased knowledge of condition, decreased mobility, decreased ROM, decreased strength, hypomobility, impaired sensation, impaired tone, impaired UE functional use, postural dysfunction, and pain.   ACTIVITY LIMITATIONS: carrying, lifting, sleeping, bathing, toileting, dressing, reach over head, and hygiene/grooming  PARTICIPATION LIMITATIONS: meal prep, cleaning, laundry, driving, shopping, community activity, occupation, and yard work  PERSONAL FACTORS: Behavior pattern and 1-2 comorbidities: Arthritis and  allergies  are also affecting patient's functional outcome.   REHAB POTENTIAL: Good  CLINICAL DECISION MAKING: Stable/uncomplicated  EVALUATION COMPLEXITY: Low   GOALS: Goals reviewed with patient? Yes  SHORT TERM GOALS: Target date: 08/26/23  Patient will be independent with her initial HEP. Baseline: Goal status: MET  2.  Patient will improve her right shoulder passive range of motion to at least 90 degrees for improved shoulder mobility. Baseline:  Goal status: MET  3.  Patient will be able to demonstrate at least 20 degrees of passive right shoulder external rotation for improved shoulder mobility Baseline:  Goal status: MET  LONG TERM GOALS: Target date: 09/16/23  Patient will be independent with her advanced HEP. Baseline:  Goal status: INITIAL  2.  Patient will be able to demonstrate at least 90 degrees of active right shoulder flexion for improved functional reaching. Baseline:  Goal status: INITIAL  3.  Patient will report being able to get dressed with minimal to no difficulty. Baseline:  Goal status: INITIAL  4.  Patient will be able to demonstrate at least 90 degrees of active right shoulder abduction for improved function reaching. Baseline:  Goal status: INITIAL  Goals will be updated and modified as needed throughout treatment as appropriate per her surgical protocol  PLAN:  PT FREQUENCY: 2-3x/week  PT DURATION: 6 weeks  PLANNED INTERVENTIONS: 97164- PT Re-evaluation, 97110-Therapeutic exercises, 97530- Therapeutic activity, 97112- Neuromuscular re-education, 97535- Self Care, 65784- Manual therapy, 97014- Electrical stimulation (unattended), 97016- Vasopneumatic device, Patient/Family education, Taping, Joint mobilization, Cryotherapy, and Moist heat  PLAN FOR NEXT SESSION: see surgical protocol   Steph Cheadle,CHRIS, PTA 08/20/2023, 5:45 PM

## 2023-08-24 ENCOUNTER — Other Ambulatory Visit: Payer: Self-pay

## 2023-08-24 ENCOUNTER — Other Ambulatory Visit: Payer: Self-pay | Admitting: Family Medicine

## 2023-08-24 ENCOUNTER — Encounter: Payer: Self-pay | Admitting: Family Medicine

## 2023-08-24 DIAGNOSIS — Z6830 Body mass index (BMI) 30.0-30.9, adult: Secondary | ICD-10-CM

## 2023-08-24 DIAGNOSIS — E782 Mixed hyperlipidemia: Secondary | ICD-10-CM

## 2023-08-25 ENCOUNTER — Ambulatory Visit: Payer: BC Managed Care – PPO | Attending: Orthopedic Surgery | Admitting: *Deleted

## 2023-08-25 DIAGNOSIS — M25611 Stiffness of right shoulder, not elsewhere classified: Secondary | ICD-10-CM

## 2023-08-25 DIAGNOSIS — M25511 Pain in right shoulder: Secondary | ICD-10-CM

## 2023-08-25 DIAGNOSIS — M6281 Muscle weakness (generalized): Secondary | ICD-10-CM

## 2023-08-25 NOTE — Therapy (Signed)
OUTPATIENT PHYSICAL THERAPY SHOULDER TREATMENT   Patient Name: Robin Harrington MRN: [id number] DOB:[birth year], 57 y.o., female Today's Date: 08/25/2023  END OF SESSION:  PT End of Session - 08/25/23 1740     Visit Number 6    Date for PT Re-Evaluation 09/18/23    PT Start Time 1645    PT Stop Time 1735    PT Time Calculation (min) 50 min             Past Medical History:  Diagnosis Date   Acne    Allergy    Hyperlipidemia    No past surgical history on file. 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   Mixed hyperlipidemia 09/07/2016   Body mass index 30.0-30.9, adult 09/07/2016   Acne 09/07/2016   REFERRING PROVIDER: Jones Broom, MD  REFERRING DIAG: s/p Right Shoulder Scope  THERAPY DIAG:  Stiffness of right shoulder, not elsewhere classified  Acute pain of right shoulder  Muscle weakness (generalized)  Rationale for Evaluation and Treatment: Rehabilitation  ONSET DATE: 07/21/23  SUBJECTIVE:                                                                                                                                                                                      SUBJECTIVE STATEMENT: RT shldr pain today   3-4/10   Hand dominance: Right  PERTINENT HISTORY: Arthritis and allergies  PAIN:  Are you having pain? Yes: NPRS scale: 3/10 Pain location: right shoulder Pain description:  intermittent ache Aggravating factors: any arm movement Relieving factors: medication  PRECAUTIONS: Shoulder  RED FLAGS: None   WEIGHT BEARING RESTRICTIONS: Yes see protocol  FALLS:  Has patient fallen in last 6 months? 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  PATIENT SURVEYS:  FOTO 19.06  COGNITION: Overall  cognitive status: Within functional limits for tasks assessed     SENSATION: Patient reports intermittent numbness and tingling   POSTURE: Forward head and rounded shoulders with RUE in sling   UPPER EXTREMITY ROM:   Active ROM Right eval (PROM) Left eval  Shoulder flexion 35 157  Shoulder extension    Shoulder abduction 30 154  Shoulder adduction    Shoulder internal rotation To abdomen To T7  Shoulder external rotation 5 To T4  Elbow flexion    Elbow extension    Wrist flexion    Wrist extension    Wrist ulnar deviation    Wrist radial deviation    Wrist pronation    Wrist supination    (Blank rows = not tested)  UPPER EXTREMITY MMT: Not tested due to surgical condition  SHOULDER SPECIAL TESTS: Not tested due to surgical condition  JOINT MOBILITY TESTING:  Unable to assess due to pain   PALPATION:  No tenderness to palpation reported   TODAY'S TREATMENT:                                                                                                                                         DATE:                                                             08/25/23                                    EXERCISE LOG    RT shldr SX 07-21-23  Exercise Repetitions and Resistance Comments  Pendulums   Forward and lateral    Scapular retraction    Supine cane ER stretch               Blank cell = exercise not performed today   Manual PROM to RT shldr flexion ABD, and ER supine with end-range holds  Vaso x 15 mins to RT shldr seated and propped on pillows for comfort    PATIENT EDUCATION: Education details: See below Person educated: Patient,  Education method: Programmer, multimedia, Demo,  Education comprehension: verbalized understanding, handout  HOME EXERCISE PROGRAM: WAND EXTERNAL ROTATION - SUPINE ER  Lie on your back holding a cane or wand with both hands.  On the affected side, place a small rolled up towel or pillow under your elbow. Maintain approx. 90 degree bend at the elbow with your arm approximately 30-45 degrees away from your side.  Use your other arm to pull the wand/cane to rotate the affected arm back into a stretch. Hold and then return to starting position and then repeat.  Video # [id number]  Repeat 10 Times Hold 10 Seconds Complete 2 Sets Perform 3 Times a Day   ASSESSMENT:  CLINICAL IMPRESSION: Pt arrived today doing fairly well with RT shldr after last Rx.  Rx focused  again on PROM for RT shldr elevation , ABD  and ER. Flexionwith end-range holds and v/c's to relax. Vaso end of session tolerated well.  OBJECTIVE IMPAIRMENTS: decreased activity tolerance, decreased knowledge of condition, decreased mobility, decreased ROM, decreased strength, hypomobility, impaired sensation, impaired tone, impaired UE functional use, postural dysfunction, and pain.   ACTIVITY LIMITATIONS: carrying, lifting, sleeping, bathing, toileting, dressing, reach over head, and hygiene/grooming  PARTICIPATION LIMITATIONS: meal prep, cleaning, laundry, driving, shopping, community activity, occupation, and yard work  PERSONAL FACTORS: Behavior pattern and 1-2 comorbidities: Arthritis and allergies  are also affecting patient's functional outcome.   REHAB POTENTIAL: Good  CLINICAL DECISION MAKING:  Stable/uncomplicated  EVALUATION COMPLEXITY: Low   GOALS: Goals reviewed with patient? Yes  SHORT TERM GOALS: Target date: 08/26/23  Patient will be independent with her initial HEP. Baseline: Goal status: MET  2.  Patient will improve her right shoulder passive range of motion to at least 90 degrees for improved shoulder mobility. Baseline:  Goal status: MET  3.  Patient will be able to demonstrate at least 20 degrees of passive right shoulder external rotation for improved shoulder mobility Baseline:  Goal status: MET  LONG TERM GOALS: Target date: 09/16/23  Patient will be independent with her advanced HEP. Baseline:  Goal status: INITIAL  2.  Patient will be able to demonstrate at least 90 degrees of active right shoulder flexion for improved functional reaching. Baseline:  Goal status: INITIAL  3.  Patient will report being able to get dressed with minimal to no difficulty. Baseline:  Goal status: INITIAL  4.  Patient will be able to demonstrate at least 90 degrees of active right shoulder abduction for improved function reaching. Baseline:  Goal status: INITIAL  Goals will be updated and modified as needed throughout treatment as appropriate per her surgical protocol  PLAN:  PT FREQUENCY: 2-3x/week  PT DURATION: 6 weeks  PLANNED INTERVENTIONS: 97164- PT Re-evaluation, 97110-Therapeutic exercises, 97530- Therapeutic activity, 97112- Neuromuscular re-education, 97535- Self Care, 16109- Manual therapy, 97014- Electrical stimulation (unattended), 97016- Vasopneumatic device, Patient/Family education, Taping, Joint mobilization, Cryotherapy, and Moist heat  PLAN FOR NEXT SESSION: see surgical protocol   Chandrika Sandles,CHRIS, PTA 08/25/2023, 5:45 PM

## 2023-08-27 ENCOUNTER — Ambulatory Visit: Payer: BC Managed Care – PPO | Admitting: *Deleted

## 2023-08-27 DIAGNOSIS — M6281 Muscle weakness (generalized): Secondary | ICD-10-CM

## 2023-08-27 DIAGNOSIS — M25611 Stiffness of right shoulder, not elsewhere classified: Secondary | ICD-10-CM | POA: Diagnosis not present

## 2023-08-27 DIAGNOSIS — M25511 Pain in right shoulder: Secondary | ICD-10-CM

## 2023-08-27 NOTE — Therapy (Addendum)
OUTPATIENT PHYSICAL THERAPY SHOULDER TREATMENT   Patient Name: Robin Harrington MRN: [id number] DOB:04/15/1966, 57 y.o., female Today's Date: 08/27/2023  END OF SESSION:  PT End of Session - 08/27/23 1745     Visit Number 7    Number of Visits 15    Date for PT Re-Evaluation 09/18/23    PT Start Time 1600    PT Stop Time 1650    PT Time Calculation (min) 50 min             Past Medical History:  Diagnosis Date   Acne    Allergy    Hyperlipidemia    No past surgical history on file. 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   Mixed hyperlipidemia 09/07/2016   Body mass index 30.0-30.9, adult 09/07/2016   Acne 09/07/2016   REFERRING PROVIDER: Jones Broom, MD  REFERRING DIAG: s/p Right Shoulder Scope  THERAPY DIAG:  Stiffness of right shoulder, not elsewhere classified  Acute pain of right shoulder  Muscle weakness (generalized)  Rationale for Evaluation and Treatment: Rehabilitation  ONSET DATE: 07/21/23  SUBJECTIVE:                                                                                                                                                                                      SUBJECTIVE STATEMENT: RT shldr pain today   3-4/10   Hand dominance: Right  PERTINENT HISTORY: Arthritis and allergies  PAIN:  Are you having pain? Yes: NPRS scale: 3/10 Pain location: right  shoulder Pain description: intermittent ache Aggravating factors: any arm movement Relieving factors: medication  PRECAUTIONS: Shoulder  RED FLAGS: None   WEIGHT BEARING RESTRICTIONS: Yes see protocol  FALLS:  Has patient fallen in last 6 months? 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  PATIENT  SURVEYS:  FOTO 19.06  COGNITION: Overall cognitive status: Within functional limits for tasks assessed     SENSATION: Patient reports intermittent numbness and tingling   POSTURE: Forward head and rounded shoulders with RUE in sling   UPPER EXTREMITY ROM:   Active ROM Right eval (PROM) Left eval  Shoulder flexion 35 157  Shoulder extension    Shoulder abduction 30 154  Shoulder adduction    Shoulder internal rotation To abdomen To T7  Shoulder external rotation 5 To T4  Elbow flexion    Elbow extension    Wrist flexion    Wrist extension    Wrist ulnar deviation    Wrist radial deviation    Wrist pronation    Wrist supination    (Blank rows = not tested)  UPPER EXTREMITY MMT: Not tested due to surgical condition  SHOULDER SPECIAL TESTS: Not tested due to surgical condition  JOINT MOBILITY TESTING:  Unable to assess due to pain   PALPATION:  No tenderness to palpation reported   TODAY'S TREATMENT:                                                                                                                                         DATE:                                                             08/27/23                                    EXERCISE LOG    RT shldr SX 07-21-23  Exercise Repetitions and Resistance Comments  Pendulums    Forward and lateral   Scapular retraction    Supine cane ER stretch               Blank cell = exercise not performed today   Manual PROM to RT shldr flexion ABD, and ER supine with end-range holds with ER to 45 degrees and flexion to 145 degrees  Vaso x 15 mins to RT shldr seated and propped on pillows for comfort    PATIENT EDUCATION: Education details: See below Person educated: Patient,  Education method: Programmer, multimedia, Demo,  Education comprehension: verbalized understanding, handout  HOME EXERCISE PROGRAM: WAND EXTERNAL ROTATION - SUPINE ER  Lie on your back holding a cane or wand with both hands.  On the affected side, place a small rolled up towel or pillow under your elbow. Maintain approx. 90 degree bend at the elbow with your arm approximately 30-45 degrees away from your side.  Use your other arm to pull the wand/cane to rotate the affected  arm back into a stretch. Hold and then return to starting position and then repeat.   Video # [id number]  Repeat 10 Times Hold 10 Seconds Complete 2 Sets Perform 3 Times a Day   ASSESSMENT:  CLINICAL IMPRESSION: Pt arrived today doing fairly well with RT shldr after last Rx and will f/u with MD on Monday.  Rx focused  again on PROM for RT shldr elevation , ABD  and ER. Flexion to 140 degrees and ER  to 45 degrees today. Vaso end of session tolerated well.  08/27/23 PROGRESS REPORT:  Patient is making good progress with skilled physical therapy within her surgical protocol as she has meet all of her short term goals for therapy. She will be continue to be progressed, as appropriate, to address her remaining impairments to return to her prior level of function.   [name], PT, DPT   OBJECTIVE IMPAIRMENTS: decreased activity tolerance, decreased knowledge of condition, decreased mobility, decreased ROM, decreased strength, hypomobility, impaired sensation, impaired tone, impaired UE functional use, postural dysfunction, and  pain.   ACTIVITY LIMITATIONS: carrying, lifting, sleeping, bathing, toileting, dressing, reach over head, and hygiene/grooming  PARTICIPATION LIMITATIONS: meal prep, cleaning, laundry, driving, shopping, community activity, occupation, and yard work  PERSONAL FACTORS: Behavior pattern and 1-2 comorbidities: Arthritis and allergies  are also affecting patient's functional outcome.   REHAB POTENTIAL: Good  CLINICAL DECISION MAKING: Stable/uncomplicated  EVALUATION COMPLEXITY: Low   GOALS: Goals reviewed with patient? Yes  SHORT TERM GOALS: Target date: 08/26/23  Patient will be independent with her initial HEP. Baseline: Goal status: MET  2.  Patient will improve her right shoulder passive range of motion to at least 90 degrees for improved shoulder mobility. Baseline:  Goal status: MET  3.  Patient will be able to demonstrate at least 20 degrees of passive right shoulder external rotation for improved shoulder mobility Baseline:  Goal status: MET  LONG TERM GOALS: Target date: 09/16/23  Patient will be independent with her advanced HEP. Baseline:  Goal status: INITIAL  2.  Patient will be able to demonstrate at least 90 degrees of active right shoulder flexion for improved functional reaching. Baseline:  Goal status: INITIAL  3.  Patient will report being able to get dressed with minimal to no difficulty. Baseline:  Goal status: INITIAL  4.  Patient will be able to demonstrate at least 90 degrees of active right shoulder abduction for improved function reaching. Baseline:  Goal status: INITIAL  Goals will be updated and modified as needed throughout treatment as appropriate per her surgical protocol  PLAN:  PT FREQUENCY: 2-3x/week  PT DURATION: 6 weeks  PLANNED INTERVENTIONS: 97164- PT Re-evaluation, 97110-Therapeutic exercises, 97530- Therapeutic activity, 97112- Neuromuscular re-education, 97535- Self Care, 47829- Manual therapy, 97014- Electrical stimulation  (unattended), 97016- Vasopneumatic device, Patient/Family education, Taping, Joint mobilization, Cryotherapy, and Moist heat  PLAN FOR NEXT SESSION: see surgical protocol. MD note   Corin Tilly,CHRIS, PTA 08/27/2023, 5:48 PM

## 2023-08-31 ENCOUNTER — Other Ambulatory Visit: Payer: BC Managed Care – PPO

## 2023-08-31 DIAGNOSIS — Z6830 Body mass index (BMI) 30.0-30.9, adult: Secondary | ICD-10-CM

## 2023-08-31 DIAGNOSIS — E782 Mixed hyperlipidemia: Secondary | ICD-10-CM

## 2023-09-01 ENCOUNTER — Encounter: Payer: Self-pay | Admitting: *Deleted

## 2023-09-01 ENCOUNTER — Ambulatory Visit: Payer: BC Managed Care – PPO | Admitting: *Deleted

## 2023-09-01 DIAGNOSIS — M25611 Stiffness of right shoulder, not elsewhere classified: Secondary | ICD-10-CM | POA: Diagnosis not present

## 2023-09-01 DIAGNOSIS — M25511 Pain in right shoulder: Secondary | ICD-10-CM

## 2023-09-01 DIAGNOSIS — M6281 Muscle weakness (generalized): Secondary | ICD-10-CM

## 2023-09-01 LAB — CMP14+EGFR
ALT: 11 [IU]/L (ref 0–32)
AST: 19 IU/L (ref 0–40)
Albumin: 4.4 g/dL (ref 3.8–4.9)
Alkaline Phosphatase: 60 [IU]/L (ref 44–121)
BUN/Creatinine Ratio: 14 (ref 9–23)
BUN: 12 mg/dL (ref 6–24)
Bilirubin Total: 0.3 mg/dL (ref 0.0–1.2)
CO2: 26 mmol/L (ref 20–29)
Calcium: 9.6 mg/dL (ref 8.7–10.2)
Chloride: 99 mmol/L (ref 96–106)
Creatinine, Ser: 0.85 mg/dL (ref 0.57–1.00)
Globulin, Total: 2.3 g/dL (ref 1.5–4.5)
Glucose: 91 mg/dL (ref 70–99)
Potassium: 4.3 mmol/L (ref 3.5–5.2)
Sodium: 138 mmol/L (ref 134–144)
Total Protein: 6.7 g/dL (ref 6.0–8.5)
eGFR: 80 mL/min/{1.73_m2} (ref >59)

## 2023-09-01 LAB — CBC WITH DIFFERENTIAL/PLATELET
Basophils Absolute: 0 10*3/uL (ref 0.0–0.2)
Basos: 1 %
EOS (ABSOLUTE): 0.1 10*3/uL (ref 0.0–0.4)
Eos: 2 %
Hematocrit: 43.3 % (ref 34.0–46.6)
Hemoglobin: 13.8 g/dL (ref 11.1–15.9)
Immature Grans (Abs): 0 10*3/uL (ref 0.0–0.1)
Immature Granulocytes: 0 %
Lymphocytes Absolute: 1.3 10*3/uL (ref 0.7–3.1)
Lymphs: 29 %
MCH: 27 pg (ref 26.6–33.0)
MCHC: 31.9 g/dL (ref 31.5–35.7)
MCV: 85 fL (ref 79–97)
Monocytes Absolute: 0.4 10*3/uL (ref 0.1–0.9)
Monocytes: 9 %
Neutrophils Absolute: 2.6 10*3/uL (ref 1.4–7.0)
Neutrophils: 59 %
Platelets: 368 10*3/uL (ref 150–450)
RBC: 5.12 x10E6/uL (ref 3.77–5.28)
RDW: 12.2 % (ref 11.7–15.4)
WBC: 4.4 10*3/uL (ref 3.4–10.8)

## 2023-09-01 LAB — LIPID PANEL
Chol/HDL Ratio: 3.8 ratio (ref 0.0–4.4)
Cholesterol, Total: 245 mg/dL — ABNORMAL HIGH (ref 100–199)
HDL: 65 mg/dL (ref >39)
LDL Chol Calc (NIH): 158 mg/dL — ABNORMAL HIGH (ref 0–99)
Triglycerides: 124 mg/dL (ref 0–149)
VLDL Cholesterol Cal: 22 mg/dL (ref 5–40)

## 2023-09-01 LAB — TSH: TSH: 1.58 u[IU]/mL (ref 0.450–4.500)

## 2023-09-01 NOTE — Therapy (Addendum)
OUTPATIENT PHYSICAL THERAPY SHOULDER TREATMENT   Patient Name: Robin Harrington MRN: [id number] DOB:[birth year], 57 y.o., female Today's Date: 09/01/2023  END OF SESSION:  PT End of Session - 09/01/23 1642     Visit Number 8    Number of Visits 15    Date for PT Re-Evaluation 09/18/23    PT Start Time 1642    PT Stop Time 1736    PT Time Calculation (min) 54 min             Past Medical History:  Diagnosis Date   Acne    Allergy    Hyperlipidemia    History reviewed. No pertinent surgical history. 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   Mixed hyperlipidemia 09/07/2016   Body mass index 30.0-30.9, adult 09/07/2016   Acne 09/07/2016   REFERRING PROVIDER: Jones Broom, MD  REFERRING DIAG: s/p Right Shoulder Scope  THERAPY DIAG:  Stiffness of right shoulder, not elsewhere classified  Acute pain of right shoulder  Muscle weakness (generalized)  Rationale for Evaluation and Treatment: Rehabilitation  ONSET DATE: 07/21/23  SUBJECTIVE:                                                                                                                                                                                      SUBJECTIVE STATEMENT: RT shldr pain today   3-4/10   Hand dominance: Right  PERTINENT HISTORY: Arthritis and allergies  PAIN:  Are you having pain? Yes: NPRS scale: 3/10 Pain  location: right shoulder Pain description: intermittent ache Aggravating factors: any arm movement Relieving factors: medication  PRECAUTIONS: Shoulder  RED FLAGS: None   WEIGHT BEARING RESTRICTIONS: Yes see protocol  FALLS:  Has patient fallen in last 6 months? 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  PATIENT  SURVEYS:  FOTO 19.06  COGNITION: Overall cognitive status: Within functional limits for tasks assessed     SENSATION: Patient reports intermittent numbness and tingling   POSTURE: Forward head and rounded shoulders with RUE in sling   UPPER EXTREMITY ROM:   Active ROM Right eval (PROM) Left eval  Shoulder flexion 35 157  Shoulder extension    Shoulder abduction 30 154  Shoulder adduction    Shoulder internal rotation To abdomen To T7  Shoulder external rotation 5 To T4  Elbow flexion    Elbow extension    Wrist flexion    Wrist extension    Wrist ulnar deviation    Wrist radial deviation    Wrist pronation    Wrist supination    (Blank rows = not tested)  UPPER EXTREMITY MMT: Not tested due to surgical condition  SHOULDER SPECIAL TESTS: Not tested due to surgical condition  JOINT MOBILITY TESTING:  Unable to assess due to pain   PALPATION:  No tenderness to palpation reported   TODAY'S TREATMENT:                                                                                                                                         DATE:                                                             09/01/23                                    EXERCISE LOG    RT shldr SX 07-21-23  Exercise Repetitions and Resistance  Comments  Pulleys X 5 mins Forward and lateral   Seated UE ranger X 5 mins   flexion/ ext,CW/CCW   Supine cane  Press 2x10, flexion 2x10            Blank cell = exercise not performed today   Manual PROM to RT shldr flexion ABD, and ER supine with end-range holds with ER to 45 degrees and flexion to 145 degrees  Vaso x 15 mins to RT shldr seated and propped on pillows for comfort    PATIENT EDUCATION: Education details: See below Person educated: Patient,  Education method: Programmer, multimedia, Demo,  Education comprehension: verbalized understanding, handout  HOME EXERCISE PROGRAM: WAND EXTERNAL ROTATION - SUPINE ER  Lie on your back holding a cane or wand with both hands.  On the affected side, place a small rolled up towel or pillow under your elbow. Maintain approx. 90 degree bend at the elbow with your arm approximately 30-45 degrees away from your side.  Use your other arm  to pull the wand/cane to rotate the affected arm back into a stretch. Hold and then return to starting position and then repeat.   Video # [id number]  Repeat 10 Times Hold 10 Seconds Complete 2 Sets Perform 3 Times a Day   ASSESSMENT:  CLINICAL IMPRESSION: Pt arrived today after MD f/u with N.O to cont with PT per protocol. She was able to perform AAROM today with pulleys, UE ranger , and supine cane exs. F/b manual PROM for RT shldr elevation , ABD  and ER. Flexion today. Vaso end of session tolerated well.  08/27/23 PROGRESS REPORT:  Patient is making good progress with skilled physical therapy within her surgical protocol as she has meet all of her short term goals for therapy. She will be continue to be progressed, as appropriate, to address her remaining impairments to return to her prior level of function.   [name], PT, DPT   OBJECTIVE IMPAIRMENTS: decreased activity tolerance, decreased knowledge of condition, decreased mobility, decreased ROM, decreased strength, hypomobility, impaired  sensation, impaired tone, impaired UE functional use, postural dysfunction, and pain.   ACTIVITY LIMITATIONS: carrying, lifting, sleeping, bathing, toileting, dressing, reach over head, and hygiene/grooming  PARTICIPATION LIMITATIONS: meal prep, cleaning, laundry, driving, shopping, community activity, occupation, and yard work  PERSONAL FACTORS: Behavior pattern and 1-2 comorbidities: Arthritis and allergies  are also affecting patient's functional outcome.   REHAB POTENTIAL: Good  CLINICAL DECISION MAKING: Stable/uncomplicated  EVALUATION COMPLEXITY: Low   GOALS: Goals reviewed with patient? Yes  SHORT TERM GOALS: Target date: 08/26/23  Patient will be independent with her initial HEP. Baseline: Goal status: MET  2.  Patient will improve her right shoulder passive range of motion to at least 90 degrees for improved shoulder mobility. Baseline:  Goal status: MET  3.  Patient will be able to demonstrate at least 20 degrees of passive right shoulder external rotation for improved shoulder mobility Baseline:  Goal status: MET  LONG TERM GOALS: Target date: 09/16/23  Patient will be independent with her advanced HEP. Baseline:  Goal status: INITIAL  2.  Patient will be able to demonstrate at least 90 degrees of active right shoulder flexion for improved functional reaching. Baseline:  Goal status: INITIAL  3.  Patient will report being able to get dressed with minimal to no difficulty. Baseline:  Goal status: INITIAL  4.  Patient will be able to demonstrate at least 90 degrees of active right shoulder abduction for improved function reaching. Baseline:  Goal status: INITIAL  Goals will be updated and modified as needed throughout treatment as appropriate per her surgical protocol  PLAN:  PT FREQUENCY: 2-3x/week  PT DURATION: 6 weeks  PLANNED INTERVENTIONS: 97164- PT Re-evaluation, 97110-Therapeutic exercises, 97530- Therapeutic activity, 97112- Neuromuscular  re-education, 97535- Self Care, 40981- Manual therapy, 97014- Electrical stimulation (unattended), 97016- Vasopneumatic device, Patient/Family education, Taping, Joint mobilization, Cryotherapy, and Moist heat  PLAN FOR NEXT SESSION: Continue as per protocol. AAROM/PROM   Mckena Chern,CHRIS, PTA 09/01/2023, 5:48 PM

## 2023-09-03 ENCOUNTER — Encounter: Payer: Self-pay | Admitting: *Deleted

## 2023-09-03 ENCOUNTER — Ambulatory Visit: Payer: BC Managed Care – PPO | Admitting: *Deleted

## 2023-09-03 DIAGNOSIS — M25511 Pain in right shoulder: Secondary | ICD-10-CM

## 2023-09-03 DIAGNOSIS — M25611 Stiffness of right shoulder, not elsewhere classified: Secondary | ICD-10-CM

## 2023-09-03 DIAGNOSIS — M6281 Muscle weakness (generalized): Secondary | ICD-10-CM

## 2023-09-03 NOTE — Therapy (Signed)
OUTPATIENT PHYSICAL THERAPY SHOULDER TREATMENT   Patient Name: Robin Harrington MRN: [id number] DOB:August 29, 1966, 57 y.o., female Today's Date: 09/03/2023  END OF SESSION:  PT End of Session - 09/03/23 1656     Visit Number 9    Number of Visits 15    Date for PT Re-Evaluation 09/18/23    PT Start Time 1645    PT Stop Time 1735    PT Time Calculation (min) 50 min             Past Medical History:  Diagnosis Date   Acne    Allergy    Hyperlipidemia    History reviewed. No pertinent surgical history. 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   Mixed hyperlipidemia 09/07/2016   Body mass index 30.0-30.9, adult 09/07/2016   Acne 09/07/2016   REFERRING PROVIDER: Jones Broom, MD  REFERRING DIAG: s/p Right Shoulder Scope  THERAPY DIAG:  Stiffness of right shoulder, not elsewhere classified  Acute pain of right shoulder  Muscle weakness (generalized)  Rationale for Evaluation and Treatment: Rehabilitation  ONSET DATE: 07/21/23  SUBJECTIVE:                                                                                                                                                                                      SUBJECTIVE STATEMENT: RT shldr pain today   3-4/10, but doing better with ROM   Hand dominance: Right  PERTINENT HISTORY: Arthritis and allergies  PAIN:  Are you having pain? Yes:  NPRS scale: 3/10 Pain location: right shoulder Pain description: intermittent ache Aggravating factors: any arm movement Relieving factors: medication  PRECAUTIONS: Shoulder  RED FLAGS: None   WEIGHT BEARING RESTRICTIONS: Yes see protocol  FALLS:  Has patient fallen in last 6 months? 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  PATIENT SURVEYS:  FOTO 19.06  COGNITION: Overall cognitive status: Within functional limits for tasks assessed     SENSATION: Patient reports intermittent numbness and tingling   POSTURE: Forward head and rounded shoulders with RUE in sling   UPPER EXTREMITY ROM:   Active ROM Right eval (PROM) Left eval  Shoulder flexion 35 157  Shoulder extension    Shoulder abduction 30 154  Shoulder adduction    Shoulder internal rotation To abdomen To T7  Shoulder external rotation 5 To T4  Elbow flexion    Elbow extension    Wrist flexion    Wrist extension    Wrist ulnar deviation    Wrist radial deviation    Wrist pronation    Wrist supination    (Blank rows = not tested)  UPPER EXTREMITY MMT: Not tested due to surgical condition  SHOULDER SPECIAL TESTS: Not tested due to surgical condition  JOINT MOBILITY TESTING:  Unable to assess due to pain   PALPATION:  No tenderness to palpation reported   TODAY'S TREATMENT:                                                                                                                                         DATE:                                                             11/14 /24                                    EXERCISE LOG    RT shldr SX 07-21-23  Exercise  Repetitions and Resistance Comments  Pulleys X 5 mins Forward and lateral   Seated UE ranger X 5 mins   flexion/ ext,CW/CCW   Supine cane  Press 2x10, flexion 2x10   Wall Ladder  X 6 to # 27        Blank cell = exercise not performed today   Manual PROM to RT shldr flexion ABD, and ER supine with end-range holds with ER to 50 degrees and flexion to 152 degrees  Vaso     PATIENT EDUCATION: Education details: See below Person educated: Patient,  Education method: Programmer, multimedia, Demo,  Education comprehension: verbalized understanding, handout  HOME EXERCISE PROGRAM: WAND EXTERNAL ROTATION - SUPINE ER  Lie on your back holding a cane or wand with both hands.  On the affected side, place a small rolled up towel or pillow under your elbow. Maintain approx. 90 degree bend at the elbow with your arm approximately 30-45 degrees away from your side.  Use your other arm  to pull the wand/cane to rotate the affected arm back into a stretch. Hold and then return to starting position and then repeat.   Video # [id number]  Repeat 10 Times Hold 10 Seconds Complete 2 Sets Perform 3 Times a Day   ASSESSMENT:  CLINICAL IMPRESSION: Pt arrived today doing fairly well with RT shldr. Soreness 3-4/10 with movements and no pain at rest. She was able to progress AAROM and PROM today.Improved kinematics GHJ and scapula.  08/27/23 PROGRESS REPORT:  Patient is making good progress with skilled physical therapy within her surgical protocol as she has meet all of her short term goals for therapy. She will be continue to be progressed, as appropriate, to address her remaining impairments to return to her prior level of function.   [name], PT, DPT   OBJECTIVE IMPAIRMENTS: decreased activity tolerance, decreased knowledge of condition, decreased mobility, decreased ROM, decreased strength, hypomobility, impaired sensation, impaired tone, impaired UE functional use, postural dysfunction, and pain.    ACTIVITY LIMITATIONS: carrying, lifting, sleeping, bathing, toileting, dressing, reach over head, and hygiene/grooming  PARTICIPATION LIMITATIONS: meal prep, cleaning, laundry, driving, shopping, community activity, occupation, and yard work  PERSONAL FACTORS: Behavior pattern and 1-2 comorbidities: Arthritis and allergies  are also affecting patient's functional outcome.   REHAB POTENTIAL: Good  CLINICAL DECISION MAKING: Stable/uncomplicated  EVALUATION COMPLEXITY: Low   GOALS: Goals reviewed with patient? Yes  SHORT TERM GOALS: Target date: 08/26/23  Patient will be independent with her initial HEP. Baseline: Goal status: MET  2.  Patient will improve her right shoulder passive range of motion to at least 90 degrees for improved shoulder mobility. Baseline:  Goal status: MET  3.  Patient will be able to demonstrate at least 20 degrees of passive right shoulder external rotation for improved shoulder mobility Baseline:  Goal status: MET  LONG TERM GOALS: Target date: 09/16/23  Patient will be independent with her advanced HEP. Baseline:  Goal status: INITIAL  2.  Patient will be able to demonstrate at least 90 degrees of active right shoulder flexion for improved functional reaching. Baseline:  Goal status: INITIAL  3.  Patient will report being able to get dressed with minimal to no difficulty. Baseline:  Goal status: INITIAL  4.  Patient will be able to demonstrate at least 90 degrees of active right shoulder abduction for improved function reaching. Baseline:  Goal status: INITIAL  Goals will be updated and modified as needed throughout treatment as appropriate per her surgical protocol  PLAN:  PT FREQUENCY: 2-3x/week  PT DURATION: 6 weeks  PLANNED INTERVENTIONS: 97164- PT Re-evaluation, 97110-Therapeutic exercises, 97530- Therapeutic activity, 97112- Neuromuscular re-education, 97535- Self Care, 16109- Manual therapy, 97014- Electrical stimulation  (unattended), 97016- Vasopneumatic device, Patient/Family education, Taping, Joint mobilization, Cryotherapy, and Moist heat  PLAN FOR NEXT SESSION: Continue as per protocol. AAROM/PROM   Wood Novacek,CHRIS, PTA 09/03/2023, 5:44 PM

## 2023-09-08 ENCOUNTER — Ambulatory Visit: Payer: BC Managed Care – PPO | Admitting: *Deleted

## 2023-09-08 DIAGNOSIS — M25511 Pain in right shoulder: Secondary | ICD-10-CM

## 2023-09-08 DIAGNOSIS — M25611 Stiffness of right shoulder, not elsewhere classified: Secondary | ICD-10-CM

## 2023-09-08 DIAGNOSIS — M6281 Muscle weakness (generalized): Secondary | ICD-10-CM

## 2023-09-08 NOTE — Therapy (Signed)
OUTPATIENT PHYSICAL THERAPY SHOULDER TREATMENT   Patient Name: Robin Harrington MRN: [id number] DOB:09/24/1966, 57 y.o., female Today's Date: 09/08/2023  END OF SESSION:  PT End of Session - 09/08/23 1646     Visit Number 10    Number of Visits 15    Date for PT Re-Evaluation 09/18/23    PT Start Time 1646             Past Medical History:  Diagnosis Date   Acne    Allergy    Hyperlipidemia    No past surgical history on file. 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   Mixed hyperlipidemia 09/07/2016   Body mass index 30.0-30.9, adult 09/07/2016   Acne 09/07/2016   REFERRING PROVIDER: Jones Broom, MD  REFERRING DIAG: s/p Right Shoulder Scope  THERAPY DIAG:  Stiffness of right shoulder, not elsewhere classified  Acute pain of right shoulder  Muscle weakness (generalized)  Rationale for Evaluation and Treatment: Rehabilitation  ONSET DATE: 07/21/23  SUBJECTIVE:                                                                                                                                                                                      SUBJECTIVE STATEMENT: RT shldr pain today   3-4/10, but doing better with ROM. Hitting  pickle ball LT handed against wall. No running. Just stood there.   Hand dominance: Right  PERTINENT HISTORY: Arthritis and allergies  PAIN:  Are you having pain?  Yes: NPRS scale: 3/10 Pain location: right shoulder Pain description: intermittent ache Aggravating factors: any arm movement Relieving factors: medication  PRECAUTIONS: Shoulder  RED FLAGS: None   WEIGHT BEARING RESTRICTIONS: Yes see protocol  FALLS:  Has patient fallen in last 6 months? 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  PATIENT SURVEYS:  FOTO 19.06  COGNITION: Overall cognitive status: Within functional limits for tasks assessed     SENSATION: Patient reports intermittent numbness and tingling   POSTURE: Forward head and rounded shoulders with RUE in sling   UPPER EXTREMITY ROM:   Active ROM Right eval (PROM) Left eval  Shoulder flexion 35 157  Shoulder extension    Shoulder abduction 30 154  Shoulder adduction    Shoulder internal rotation To abdomen To T7  Shoulder external rotation 5 To T4  Elbow flexion    Elbow extension    Wrist flexion    Wrist extension    Wrist ulnar deviation    Wrist radial deviation    Wrist pronation    Wrist supination    (Blank rows = not tested)  UPPER EXTREMITY MMT: Not tested due to surgical condition  SHOULDER SPECIAL TESTS: Not tested due to surgical condition  JOINT MOBILITY TESTING:  Unable to assess due to pain   PALPATION:  No tenderness to palpation reported   TODAY'S TREATMENT:                                                                                                                                         DATE:                                                             11/19 /24                                    EXERCISE LOG    RT shldr SX 07-21-23  Exercise  Repetitions and Resistance Comments  Pulleys X 6 mins Forward and lateral   Standing UE ranger X 6 mins   flexion/ ext,CW/CCW   Supine cane     Wall Ladder          Blank cell = exercise not performed today   Manual PROM to RT shldr flexion ABD, and ER supine with end-range holds with ER to 55 degrees and flexion to 160 degrees Rhythmic stab for Elevation      PATIENT EDUCATION: Education details: See below Person educated: Patient,  Education method: Programmer, multimedia, Demo,  Education comprehension: verbalized understanding, handout  HOME EXERCISE PROGRAM: WAND EXTERNAL ROTATION - SUPINE ER  Lie on your back holding a cane or wand with both hands.  On the affected side, place a small rolled up towel or pillow under your elbow. Maintain approx. 90 degree bend at the elbow with your arm approximately 30-45 degrees away from your side.  Use your other arm to pull the wand/cane to rotate the affected  arm back into a stretch. Hold and then return to starting position and then repeat.   Video # [id number]  Repeat 10 Times Hold 10 Seconds Complete 2 Sets Perform 3 Times a Day   ASSESSMENT:  CLINICAL IMPRESSION: Pt arrived today doing fairly well with RT shldr still and was able to continue with AAROM exs and PROM. Soreness 3-4/10 with movements and no pain at rest. She was able to progress AAROM and PROM today.Improved flexion to 160 degrees today  08/27/23 PROGRESS REPORT:  Patient is making good progress with skilled physical therapy within her surgical protocol as she has meet all of her short term goals for therapy. She will be continue to be progressed, as appropriate, to address her remaining impairments to return to her prior level of function.   [name], PT, DPT   OBJECTIVE IMPAIRMENTS: decreased activity tolerance, decreased knowledge of condition, decreased mobility, decreased ROM, decreased strength, hypomobility, impaired sensation, impaired tone, impaired UE  functional use, postural dysfunction, and pain.   ACTIVITY LIMITATIONS: carrying, lifting, sleeping, bathing, toileting, dressing, reach over head, and hygiene/grooming  PARTICIPATION LIMITATIONS: meal prep, cleaning, laundry, driving, shopping, community activity, occupation, and yard work  PERSONAL FACTORS: Behavior pattern and 1-2 comorbidities: Arthritis and allergies  are also affecting patient's functional outcome.   REHAB POTENTIAL: Good  CLINICAL DECISION MAKING: Stable/uncomplicated  EVALUATION COMPLEXITY: Low   GOALS: Goals reviewed with patient? Yes  SHORT TERM GOALS: Target date: 08/26/23  Patient will be independent with her initial HEP. Baseline: Goal status: MET  2.  Patient will improve her right shoulder passive range of motion to at least 90 degrees for improved shoulder mobility. Baseline:  Goal status: MET  3.  Patient will be able to demonstrate at least 20 degrees of passive right shoulder external rotation for improved shoulder mobility Baseline:  Goal status: MET  LONG TERM GOALS: Target date: 09/16/23  Patient will be independent with her advanced HEP. Baseline:  Goal status: INITIAL  2.  Patient will be able to demonstrate at least 90 degrees of active right shoulder flexion for improved functional reaching. Baseline:  Goal status: INITIAL  3.  Patient will report being able to get dressed with minimal to no difficulty. Baseline:  Goal status: INITIAL  4.  Patient will be able to demonstrate at least 90 degrees of active right shoulder abduction for improved function reaching. Baseline:  Goal status: INITIAL  Goals will be updated and modified as needed throughout treatment as appropriate per her surgical protocol  PLAN:  PT FREQUENCY: 2-3x/week  PT DURATION: 6 weeks  PLANNED INTERVENTIONS: 97164- PT Re-evaluation, 97110-Therapeutic exercises, 97530- Therapeutic activity, 97112- Neuromuscular re-education, 97535- Self Care, 16109-  Manual therapy, 97014- Electrical stimulation (unattended), 97016- Vasopneumatic device, Patient/Family education, Taping, Joint mobilization, Cryotherapy, and Moist heat  PLAN FOR NEXT SESSION: Continue as per protocol. AAROM/PROM   Harolyn Cocker,CHRIS, PTA 09/08/2023, 4:55 PM

## 2023-09-10 ENCOUNTER — Encounter: Payer: BC Managed Care – PPO | Admitting: *Deleted

## 2023-09-11 ENCOUNTER — Encounter: Payer: Self-pay | Admitting: *Deleted

## 2023-09-11 ENCOUNTER — Ambulatory Visit: Payer: BC Managed Care – PPO | Admitting: *Deleted

## 2023-09-11 DIAGNOSIS — M6281 Muscle weakness (generalized): Secondary | ICD-10-CM

## 2023-09-11 DIAGNOSIS — M25611 Stiffness of right shoulder, not elsewhere classified: Secondary | ICD-10-CM | POA: Diagnosis not present

## 2023-09-11 DIAGNOSIS — M25511 Pain in right shoulder: Secondary | ICD-10-CM

## 2023-09-11 NOTE — Therapy (Signed)
OUTPATIENT PHYSICAL THERAPY SHOULDER TREATMENT   Patient Name: Robin Harrington MRN: [id number] DOB:[birth year], 57 y.o., female Today's Date: 09/11/2023  END OF SESSION:  PT End of Session - 09/11/23 0814     Visit Number 11    Number of Visits 15    PT Start Time 0802    PT Stop Time 0851    PT Time Calculation (min) 49 min             Past Medical History:  Diagnosis Date   Acne    Allergy    Hyperlipidemia    History reviewed. No pertinent surgical history. 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   Mixed hyperlipidemia 09/07/2016   Body mass index 30.0-30.9, adult 09/07/2016   Acne 09/07/2016   REFERRING PROVIDER: Jones Broom, MD  REFERRING DIAG: s/p Right Shoulder Scope  THERAPY DIAG:  Stiffness of right shoulder, not elsewhere classified  Acute pain of right shoulder  Muscle weakness (generalized)  Rationale for Evaluation and Treatment: Rehabilitation  ONSET DATE: 07/21/23  SUBJECTIVE:                                                                                                                                                                                      SUBJECTIVE STATEMENT: RT shldr pain today   3-4/10, Need to be able to do a "messy bun"   Hand dominance: Right  PERTINENT HISTORY: Arthritis and allergies  PAIN:  Are you having pain? Yes: NPRS scale: 3/10 Pain  location: right shoulder Pain description: intermittent ache Aggravating factors: any arm movement Relieving factors: medication  PRECAUTIONS: Shoulder  RED FLAGS: None   WEIGHT BEARING RESTRICTIONS: Yes see protocol  FALLS:  Has patient fallen in last 6 months? 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  PATIENT SURVEYS:  FOTO 19.06  COGNITION: Overall cognitive status: Within functional limits for tasks assessed     SENSATION: Patient reports intermittent numbness and tingling   POSTURE: Forward head and rounded shoulders with RUE in sling   UPPER EXTREMITY ROM:   Active ROM Right eval (PROM) Left eval  Shoulder flexion 35 157  Shoulder extension    Shoulder abduction 30 154  Shoulder adduction    Shoulder internal rotation To abdomen To T7  Shoulder external rotation 5 To T4  Elbow flexion    Elbow extension    Wrist flexion    Wrist extension    Wrist ulnar deviation    Wrist radial deviation    Wrist pronation    Wrist supination    (Blank rows = not tested)  UPPER EXTREMITY MMT: Not tested due to surgical condition  SHOULDER SPECIAL TESTS: Not tested due to surgical condition  JOINT MOBILITY TESTING:  Unable to assess due to pain   PALPATION:  No tenderness to palpation reported   TODAY'S TREATMENT:                                                                                                                                         DATE:                                                             11/19 /24                                    EXERCISE LOG    RT shldr SX 07-21-23  Exercise Repetitions and Resistance  Comments  Pulleys X 6 mins Forward and lateral   Standing UE ranger X 6 mins   flexion/ ext,CW/CCW   Supine cane     Wall Ladder          Blank cell = exercise not performed today   Manual   AAROM and  PROM to RT shldr flexion ABD, and ER supine with end-range holds with ER to 58 degrees and flexion to 160 degrees Rhythmic stab for Elevation and MAT for ER.       PATIENT EDUCATION: Education details: See below Person educated: Patient,  Education method: Explanation, Demo,  Education comprehension: verbalized understanding, handout  HOME EXERCISE PROGRAM: WAND EXTERNAL ROTATION - SUPINE ER  Lie on your back holding a cane or wand with both hands.  On the affected side, place a small rolled up towel or pillow under your elbow. Maintain approx. 90 degree bend at the elbow with your arm approximately 30-45 degrees away from your side.  Use your  other arm to pull the wand/cane to rotate the affected arm back into a stretch. Hold and then return to starting position and then repeat.   Video # [id number]  Repeat 10 Times Hold 10 Seconds Complete 2 Sets Perform 3 Times a Day   ASSESSMENT:  CLINICAL IMPRESSION: Pt arrived today doing fairly well with RT shldr still and was able to continue with AAROM exs and PROM. Soreness 3-4/10 with movements and no pain at rest. She was able to progress AAROM and PROM today.Improved flexion to 162 degrees today. AAROM in supine as well as sitting with combined motions for Pt to fix her hair.  08/27/23 PROGRESS REPORT:  Patient is making good progress with skilled physical therapy within her surgical protocol as she has meet all of her short term goals for therapy. She will be continue to be progressed, as appropriate, to address her remaining impairments to return to her prior level of function.   [name], PT, DPT   OBJECTIVE IMPAIRMENTS: decreased activity tolerance, decreased knowledge of condition, decreased mobility, decreased ROM,  decreased strength, hypomobility, impaired sensation, impaired tone, impaired UE functional use, postural dysfunction, and pain.   ACTIVITY LIMITATIONS: carrying, lifting, sleeping, bathing, toileting, dressing, reach over head, and hygiene/grooming  PARTICIPATION LIMITATIONS: meal prep, cleaning, laundry, driving, shopping, community activity, occupation, and yard work  PERSONAL FACTORS: Behavior pattern and 1-2 comorbidities: Arthritis and allergies  are also affecting patient's functional outcome.   REHAB POTENTIAL: Good  CLINICAL DECISION MAKING: Stable/uncomplicated  EVALUATION COMPLEXITY: Low   GOALS: Goals reviewed with patient? Yes  SHORT TERM GOALS: Target date: 08/26/23  Patient will be independent with her initial HEP. Baseline: Goal status: MET  2.  Patient will improve her right shoulder passive range of motion to at least 90 degrees for improved shoulder mobility. Baseline:  Goal status: MET  3.  Patient will be able to demonstrate at least 20 degrees of passive right shoulder external rotation for improved shoulder mobility Baseline:  Goal status: MET  LONG TERM GOALS: Target date: 09/16/23  Patient will be independent with her advanced HEP. Baseline:  Goal status: INITIAL  2.  Patient will be able to demonstrate at least 90 degrees of active right shoulder flexion for improved functional reaching. Baseline:  Goal status: INITIAL  3.  Patient will report being able to get dressed with minimal to no difficulty. Baseline:  Goal status: INITIAL  4.  Patient will be able to demonstrate at least 90 degrees of active right shoulder abduction for improved function reaching. Baseline:  Goal status: INITIAL  Goals will be updated and modified as needed throughout treatment as appropriate per her surgical protocol  PLAN:  PT FREQUENCY: 2-3x/week  PT DURATION: 6 weeks  PLANNED INTERVENTIONS: 97164- PT Re-evaluation, 97110-Therapeutic exercises, 97530-  Therapeutic activity, 97112- Neuromuscular re-education, 97535- Self Care, 45409- Manual therapy, 97014- Electrical stimulation (unattended), 97016- Vasopneumatic device, Patient/Family education, Taping, Joint mobilization, Cryotherapy, and Moist heat  PLAN FOR NEXT SESSION: Continue as per protocol. AAROM/PROM   Miaisabella Bacorn,CHRIS, PTA 09/11/2023, 9:01 AM

## 2023-09-15 ENCOUNTER — Ambulatory Visit: Payer: BC Managed Care – PPO | Admitting: *Deleted

## 2023-09-15 ENCOUNTER — Encounter: Payer: Self-pay | Admitting: *Deleted

## 2023-09-15 DIAGNOSIS — M6281 Muscle weakness (generalized): Secondary | ICD-10-CM

## 2023-09-15 DIAGNOSIS — M25611 Stiffness of right shoulder, not elsewhere classified: Secondary | ICD-10-CM | POA: Diagnosis not present

## 2023-09-15 DIAGNOSIS — M25511 Pain in right shoulder: Secondary | ICD-10-CM

## 2023-09-15 NOTE — Therapy (Signed)
OUTPATIENT PHYSICAL THERAPY SHOULDER TREATMENT   Patient Name: Robin Harrington MRN: [id number] DOB:[birth year], 57 y.o., female Today's Date: 09/15/2023  END OF SESSION:  PT End of Session - 09/15/23 1649     Visit Number 12    Number of Visits 15    Date for PT Re-Evaluation 09/18/23    PT Start Time 1645             Past Medical History:  Diagnosis Date   Acne    Allergy    Hyperlipidemia    History reviewed. No pertinent surgical history. 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   Mixed hyperlipidemia 09/07/2016   Body mass index 30.0-30.9, adult 09/07/2016   Acne 09/07/2016   REFERRING PROVIDER: Jones Broom, MD  REFERRING DIAG: s/p Right Shoulder Scope  THERAPY DIAG:  Stiffness of right shoulder, not elsewhere classified  Acute pain of right shoulder  Muscle weakness (generalized)  Rationale for Evaluation and Treatment: Rehabilitation  ONSET DATE: 07/21/23  SUBJECTIVE:                                                                                                                                                                                      SUBJECTIVE STATEMENT: RT shldr pain today 2/10 and was  able to do a "messy bun" while leaning   Hand dominance: Right  PERTINENT HISTORY: Arthritis and allergies  PAIN:  Are you having pain? Yes: NPRS scale: 3/10 Pain location: right  shoulder Pain description: intermittent ache Aggravating factors: any arm movement Relieving factors: medication  PRECAUTIONS: Shoulder  RED FLAGS: None   WEIGHT BEARING RESTRICTIONS: Yes see protocol  FALLS:  Has patient fallen in last 6 months? 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  PATIENT SURVEYS:  FOTO 19.06  COGNITION: Overall  cognitive status: Within functional limits for tasks assessed     SENSATION: Patient reports intermittent numbness and tingling   POSTURE: Forward head and rounded shoulders with RUE in sling   UPPER EXTREMITY ROM:   Active ROM Right eval (PROM) Left eval  Shoulder flexion 35 157  Shoulder extension    Shoulder abduction 30 154  Shoulder adduction    Shoulder internal rotation To abdomen To T7  Shoulder external rotation 5 To T4  Elbow flexion    Elbow extension    Wrist flexion    Wrist extension    Wrist ulnar deviation    Wrist radial deviation    Wrist pronation    Wrist supination    (Blank rows = not tested)  UPPER EXTREMITY MMT: Not tested due to surgical condition  SHOULDER SPECIAL TESTS: Not tested due to surgical condition  JOINT MOBILITY TESTING:  Unable to assess due to pain   PALPATION:  No tenderness to palpation reported   TODAY'S TREATMENT:                                                                                                                                         DATE:                                                             11/19 /24                                    EXERCISE LOG    RT shldr SX 07-21-23  Exercise Repetitions and Resistance Comments  Pulleys X  6 mins Forward and lateral   Standing UE ranger X 6 mins   flexion/ ext,CW/CCW   Supine cane     Wall Ladder  X 10   Rows x10   Ext x10   LLLDS ER X 3 mins    Blank cell = exercise not performed today   Manual   AAROM and  PROM to RT shldr flexion ABD, and ER supine with end-range holds with ER to 58 degrees and flexion to 160 degrees Rhythmic stab for Elevation and MAT for ER.       PATIENT EDUCATION: Education details: See below Person educated: Patient,  Education method: Explanation, Demo,  Education comprehension: verbalized understanding, handout  HOME EXERCISE PROGRAM: WAND EXTERNAL ROTATION - SUPINE ER  Lie on your back holding a cane or wand with both hands.  On the affected side, place a small rolled up towel or pillow under your elbow. Maintain approx. 90 degree bend at the elbow with your arm approximately 30-45 degrees away from your  side.  Use your other arm to pull the wand/cane to rotate the affected arm back into a stretch. Hold and then return to starting position and then repeat.   Video # [id number]  Repeat 10 Times Hold 10 Seconds Complete 2 Sets Perform 3 Times a Day   ASSESSMENT:  CLINICAL IMPRESSION: Pt arrived today doing fairly  well with RT shldr and was able to continue with AAROM exs with added AROM and did great. She was able to use RT UE to fix her hair today while leaning forward. Progressing towards goals.  08/27/23 PROGRESS REPORT:  Patient is making good progress with skilled physical therapy within her surgical protocol as she has meet all of her short term goals for therapy. She will be continue to be progressed, as appropriate, to address her remaining impairments to return to her prior level of function.   [name], PT, DPT   OBJECTIVE IMPAIRMENTS: decreased activity tolerance, decreased knowledge of condition, decreased mobility, decreased ROM, decreased strength, hypomobility, impaired sensation, impaired tone, impaired UE  functional use, postural dysfunction, and pain.   ACTIVITY LIMITATIONS: carrying, lifting, sleeping, bathing, toileting, dressing, reach over head, and hygiene/grooming  PARTICIPATION LIMITATIONS: meal prep, cleaning, laundry, driving, shopping, community activity, occupation, and yard work  PERSONAL FACTORS: Behavior pattern and 1-2 comorbidities: Arthritis and allergies  are also affecting patient's functional outcome.   REHAB POTENTIAL: Good  CLINICAL DECISION MAKING: Stable/uncomplicated  EVALUATION COMPLEXITY: Low   GOALS: Goals reviewed with patient? Yes  SHORT TERM GOALS: Target date: 08/26/23  Patient will be independent with her initial HEP. Baseline: Goal status: MET  2.  Patient will improve her right shoulder passive range of motion to at least 90 degrees for improved shoulder mobility. Baseline:  Goal status: MET  3.  Patient will be able to demonstrate at least 20 degrees of passive right shoulder external rotation for improved shoulder mobility Baseline:  Goal status: MET  LONG TERM GOALS: Target date: 09/16/23  Patient will be independent with her advanced HEP. Baseline:  Goal status: INITIAL  2.  Patient will be able to demonstrate at least 90 degrees of active right shoulder flexion for improved functional reaching. Baseline:  Goal status: INITIAL  3.  Patient will report being able to get dressed with minimal to no difficulty. Baseline:  Goal status: INITIAL  4.  Patient will be able to demonstrate at least 90 degrees of active right shoulder abduction for improved function reaching. Baseline:  Goal status: INITIAL  Goals will be updated and modified as needed throughout treatment as appropriate per her surgical protocol  PLAN:  PT FREQUENCY: 2-3x/week  PT DURATION: 6 weeks  PLANNED INTERVENTIONS: 97164- PT Re-evaluation, 97110-Therapeutic exercises, 97530- Therapeutic activity, 97112- Neuromuscular re-education, 97535- Self Care, 08657-  Manual therapy, 97014- Electrical stimulation (unattended), 97016- Vasopneumatic device, Patient/Family education, Taping, Joint mobilization, Cryotherapy, and Moist heat  PLAN FOR NEXT SESSION: Continue as per protocol. AAROM/PROM, AROM   Aritza Brunet,CHRIS, PTA 09/15/2023, 5:33 PM

## 2023-09-16 ENCOUNTER — Encounter: Payer: Self-pay | Admitting: Family Medicine

## 2023-09-16 ENCOUNTER — Ambulatory Visit (INDEPENDENT_AMBULATORY_CARE_PROVIDER_SITE_OTHER): Payer: BC Managed Care – PPO | Admitting: Family Medicine

## 2023-09-16 VITALS — BP 100/68 | HR 78 | Temp 98.0°F | Ht 65.0 in | Wt 170.0 lb

## 2023-09-16 DIAGNOSIS — L700 Acne vulgaris: Secondary | ICD-10-CM

## 2023-09-16 DIAGNOSIS — R002 Palpitations: Secondary | ICD-10-CM | POA: Diagnosis not present

## 2023-09-16 DIAGNOSIS — E782 Mixed hyperlipidemia: Secondary | ICD-10-CM

## 2023-09-16 DIAGNOSIS — K219 Gastro-esophageal reflux disease without esophagitis: Secondary | ICD-10-CM

## 2023-09-16 DIAGNOSIS — F5109 Other insomnia not due to a substance or known physiological condition: Secondary | ICD-10-CM | POA: Diagnosis not present

## 2023-09-16 DIAGNOSIS — M1710 Unilateral primary osteoarthritis, unspecified knee: Secondary | ICD-10-CM

## 2023-09-16 MED ORDER — OMEPRAZOLE 40 MG PO CPDR: 40.0000 mg | DELAYED_RELEASE_CAPSULE | Freq: Every day | ORAL | 3 refills | Status: DC

## 2023-09-16 MED ORDER — ORACEA 40 MG PO CPDR: 40.0000 mg | DELAYED_RELEASE_CAPSULE | Freq: Every day | ORAL | 1 refills | Status: DC

## 2023-09-16 MED ORDER — ZOLPIDEM TARTRATE 5 MG PO TABS: 5.0000 mg | ORAL_TABLET | Freq: Every evening | ORAL | 0 refills | Status: DC | PRN

## 2023-09-16 MED ORDER — MELOXICAM 15 MG PO TABS: 15.0000 mg | ORAL_TABLET | Freq: Every day | ORAL | 1 refills | Status: DC

## 2023-09-16 NOTE — Progress Notes (Signed)
Established Patient Office Visit  Subjective   Patient ID: Robin Harrington, female    DOB: May 22, 1966  Age: 57 y.o. MRN: [id number]  Chief Complaint  Patient presents with   Medical Management of Chronic Issues    HPI  GERD Compliant with medications - Yes Current medications - protonix 40 mg  She reports chest discomfort after eating that is relieved by belching. No dysphagia. Occasional vomiting, dysphagia, cough, sore throat. Has water brash. Felt like omeprazole was more effective.   2. HLD She recently had to hold this for a few weeks due to surgery prior to her labs.   3. Heart palpitations Had an episode where her heart was racing while in the grocery store 2 weeks ago. HR was 170s on her apple watch. This lasted for 5-6 minutes. She did some deep breathing and this resolved. Has not occurred since then. No symptoms before. She felt a little dizzy and short of breath during the episode. She has some intermittent palpitations followed by burping which improved symptoms. She wonders how much of her palpitations are related to her GERD.   4. Knee pain Taking mobic prn with good relief.   5. Situational insomnia She has difficulty sleeping when traveling. She has ambien nightly when traveling with good relief and without side effects. She will be traveling for a week a christmas and is requesting a refill today while she is in the office for this.      03/18/2023    9:31 AM 12/29/2022    3:26 PM 09/05/2022    4:26 PM  Depression screen PHQ 2/9  Decreased Interest 0 0 0  Down, Depressed, Hopeless 0 0 1  PHQ - 2 Score 0 0 1  Altered sleeping 1 1 1   Tired, decreased energy 0 1 1  Change in appetite 2 2 1   Feeling bad or failure about yourself  0 0 0  Trouble concentrating 0 0 0  Moving slowly or fidgety/restless 0 0 0  Suicidal thoughts 0 0 0  PHQ-9 Score 3 4 4   Difficult doing work/chores Not difficult at all Not difficult at all Not difficult at all      09/16/2023    10:47 AM 03/18/2023    9:32 AM 12/29/2022    3:27 PM 09/05/2022    4:27 PM  GAD 7 : Generalized Anxiety Score  Nervous, Anxious, on Edge 0 0 1 1  Control/stop worrying 0 0 1 1  Worry too much - different things 0 0 1 1  Trouble relaxing 0 0 1 1  Restless 0 0 0 0  Easily annoyed or irritable 0 0 1 0  Afraid - awful might happen 0 0 0 1  Total GAD 7 Score 0 0 5 5  Anxiety Difficulty Not difficult at all Not difficult at all Not difficult at all Not difficult at all       ROS As per HPI.    Objective:     BP 100/68   Pulse 78   Temp 98 F (36.7 C) (Temporal)   Ht 5\' 5"  (1.651 m)   Wt 170 lb (77.1 kg)   SpO2 97%   BMI 28.29 kg/m    Physical Exam Vitals and nursing note reviewed.  Constitutional:      General: She is not in acute distress.    Appearance: She is not ill-appearing, toxic-appearing or diaphoretic.  Cardiovascular:     Rate and Rhythm: Normal rate and regular rhythm.  Heart sounds: Normal heart sounds. No murmur heard. Pulmonary:     Effort: Pulmonary effort is normal. No respiratory distress.     Breath sounds: Normal breath sounds. No wheezing or rhonchi.  Abdominal:     General: Bowel sounds are normal. There is no distension.     Palpations: Abdomen is soft.     Tenderness: There is no abdominal tenderness. There is no guarding or rebound.  Musculoskeletal:     Cervical back: Neck supple. No rigidity.     Right lower leg: No edema.     Left lower leg: No edema.  Skin:    General: Skin is warm and dry.  Neurological:     General: No focal deficit present.     Mental Status: She is alert and oriented to person, place, and time.  Psychiatric:        Mood and Affect: Mood normal.        Behavior: Behavior normal.      No results found for any visits on 09/16/23.    The 10-year ASCVD risk score (Arnett DK, et al., 2019) is: 1.6%    Assessment & Plan:   Robin Harrington was seen today for medical management of chronic issues.  Diagnoses and  all orders for this visit:  Gastroesophageal reflux disease without esophagitis Uncontrolled. Will switch to omeprazole as she felt this was more effective. Discussed referral to GI if symptoms do not improve or worsen.  -     omeprazole (PRILOSEC) 40 MG capsule; Take 1 capsule (40 mg total) by mouth daily.  Palpitations Seems to be triggered by GERD. Will see if this improves with treatment of GERD. However discussed zio if symptoms worsen or persist despite change in GERD treatment.   Mixed hyperlipidemia Reviewed recent LDL, not at goal. She has had to hold statin for weeks recently. Will repeat fasting panel in 3-6 months after consistent statin use.   Situational insomnia PDMP reviewed, no red flags. Refills provided for upcoming travel.  -     zolpidem (AMBIEN) 5 MG tablet; Take 1 tablet (5 mg total) by mouth at bedtime as needed. for sleep  Acne vulgaris Well controlled on current regimen.  -     Doxycycline, Rosacea, (ORACEA) 40 MG CPDR; Take 40 mg by mouth daily.  Patellofemoral arthritis Well controlled on current regimen.  -     meloxicam (MOBIC) 15 MG tablet; Take 1 tablet (15 mg total) by mouth daily.  Return in about 6 weeks (around 10/28/2023) for GERD follow up.   The patient indicates understanding of these issues and agrees with the plan.  [name], FNP

## 2023-09-21 ENCOUNTER — Ambulatory Visit: Payer: BC Managed Care – PPO | Admitting: Family Medicine

## 2023-09-22 ENCOUNTER — Encounter: Payer: Self-pay | Admitting: *Deleted

## 2023-09-22 ENCOUNTER — Ambulatory Visit: Payer: BC Managed Care – PPO | Attending: Orthopedic Surgery | Admitting: *Deleted

## 2023-09-22 DIAGNOSIS — M25511 Pain in right shoulder: Secondary | ICD-10-CM | POA: Insufficient documentation

## 2023-09-22 DIAGNOSIS — M25611 Stiffness of right shoulder, not elsewhere classified: Secondary | ICD-10-CM | POA: Diagnosis present

## 2023-09-22 DIAGNOSIS — M6281 Muscle weakness (generalized): Secondary | ICD-10-CM | POA: Insufficient documentation

## 2023-09-22 NOTE — Therapy (Signed)
OUTPATIENT PHYSICAL THERAPY SHOULDER TREATMENT   Patient Name: Robin Harrington MRN: [id number] DOB:25-Sep-1966, 57 y.o., female Today's Date: 09/22/2023  END OF SESSION:  PT End of Session - 09/22/23 1651     Visit Number 13    Number of Visits 15    Date for PT Re-Evaluation 09/18/23    PT Start Time 1645    PT Stop Time 1733    PT Time Calculation (min) 48 min             Past Medical History:  Diagnosis Date   Acne    Allergy    Hyperlipidemia    History reviewed. No pertinent surgical history. 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   Anorectal disorder 01/11/2021   Anal spasm 01/11/2021   Patellofemoral arthritis 04/13/2019   Acute cystitis without hematuria 12/10/2018   Cervical pain 04/17/2017   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   Mixed hyperlipidemia 09/07/2016   Body mass index 30.0-30.9, adult 09/07/2016   Acne 09/07/2016   REFERRING PROVIDER: Jones Broom, MD  REFERRING DIAG: s/p Right Shoulder Scope  THERAPY DIAG:  Stiffness of right shoulder, not elsewhere classified  Acute pain of right shoulder  Muscle weakness (generalized)  Rationale for Evaluation and Treatment: Rehabilitation  ONSET DATE: 07/21/23  SUBJECTIVE:                                                                                                                                                                                      SUBJECTIVE STATEMENT: RT shldr pain today 2/10 and doing good   Hand dominance: Right  PERTINENT HISTORY: Arthritis and allergies  PAIN:  Are you  having pain? Yes: NPRS scale: 2/10 Pain location: right shoulder Pain description: intermittent ache Aggravating factors: any arm movement Relieving factors: medication  PRECAUTIONS: Shoulder  RED FLAGS: None   WEIGHT BEARING RESTRICTIONS: Yes see protocol  FALLS:  Has patient fallen in last 6 months? 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  PATIENT SURVEYS:  FOTO 19.06  COGNITION: Overall cognitive status: Within functional limits for tasks assessed     SENSATION: Patient reports intermittent numbness and tingling   POSTURE: Forward head and rounded shoulders with RUE in sling   UPPER EXTREMITY ROM:   Active ROM Right eval (PROM) Left eval  Shoulder flexion 35 157  Shoulder extension    Shoulder abduction 30 154  Shoulder adduction    Shoulder internal rotation To abdomen To T7  Shoulder external rotation 5 To T4  Elbow flexion    Elbow extension    Wrist flexion    Wrist extension    Wrist ulnar deviation    Wrist radial deviation    Wrist pronation    Wrist supination    (Blank rows = not tested)  UPPER EXTREMITY MMT: Not tested due to surgical condition  SHOULDER SPECIAL TESTS: Not tested due to surgical condition  JOINT MOBILITY TESTING:  Unable to assess due to pain   PALPATION:  No tenderness to palpation reported   TODAY'S TREATMENT:                                                                                                                                         DATE:                                                             09/22/23                                    EXERCISE LOG    RT shldr SX 07-21-23                    9 weeks  09-22-23  Exercise Repetitions and Resistance Comments  Pulleys X 6 mins Forward and lateral   Standing UE ranger X 6 mins   flexion/ ext,CW/CCW   UBE X 6 mins   Ball Wall Rolls  3x10   Rows x20   Ext x20   LLLDS ER    AROM flexion/ABD  x20        Blank cell = exercise not performed today   Manual   AAROM and  PROM to RT shldr flexion ABD, and ER supine with end-range holds Rhythmic stab for Elevation and MAT for ER.       PATIENT EDUCATION: Education details: See below Person educated: Patient,  Education method: Explanation, Demo,  Education comprehension: verbalized understanding, handout  HOME EXERCISE PROGRAM: WAND EXTERNAL ROTATION - SUPINE ER  Lie on your back holding a cane or wand with both hands.  On the affected side, place a small rolled up towel or pillow under your elbow. Maintain approx. 90 degree bend at the elbow with your arm approximately 30-45 degrees away from your side.  Use your other arm to pull the wand/cane to rotate the affected arm back into a stretch. Hold and then return to starting position and then repeat.   Video # [id number]  Repeat 10 Times Hold 10 Seconds Complete 2 Sets Perform 3 Times a Day   ASSESSMENT:  CLINICAL IMPRESSION: Pt arrived today doing fairly  well with RT shldr and was able to continue with AAROM exs with added AROM  and UBE and did great. She was able to perform all exs with mainly fatigue  08/27/23 PROGRESS REPORT:  Patient is making good progress with skilled physical therapy within her surgical protocol as she has meet all of her short term goals for therapy. She will be continue to be progressed, as appropriate, to address her remaining impairments to return to her prior level of function.   [name], PT, DPT   OBJECTIVE IMPAIRMENTS: decreased activity tolerance, decreased knowledge of condition, decreased mobility, decreased ROM, decreased strength, hypomobility, impaired sensation,  impaired tone, impaired UE functional use, postural dysfunction, and pain.   ACTIVITY LIMITATIONS: carrying, lifting, sleeping, bathing, toileting, dressing, reach over head, and hygiene/grooming  PARTICIPATION LIMITATIONS: meal prep, cleaning, laundry, driving, shopping, community activity, occupation, and yard work  PERSONAL FACTORS: Behavior pattern and 1-2 comorbidities: Arthritis and allergies  are also affecting patient's functional outcome.   REHAB POTENTIAL: Good  CLINICAL DECISION MAKING: Stable/uncomplicated  EVALUATION COMPLEXITY: Low   GOALS: Goals reviewed with patient? Yes  SHORT TERM GOALS: Target date: 08/26/23  Patient will be independent with her initial HEP. Baseline: Goal status: MET  2.  Patient will improve her right shoulder passive range of motion to at least 90 degrees for improved shoulder mobility. Baseline:  Goal status: MET  3.  Patient will be able to demonstrate at least 20 degrees of passive right shoulder external rotation for improved shoulder mobility Baseline:  Goal status: MET  LONG TERM GOALS: Target date: 09/16/23  Patient will be independent with her advanced HEP. Baseline:  Goal status: On going  2.  Patient will be able to demonstrate at least 90 degrees of active right shoulder flexion for improved functional reaching. Baseline:  Goal status: INITIAL  3.  Patient will report being able to get dressed with minimal to no difficulty. Baseline:  Goal status: INITIAL  4.  Patient will be able to demonstrate at least 90 degrees of active right shoulder abduction for improved function reaching. Baseline:  Goal status: INITIAL  Goals will be updated and modified as needed throughout treatment as appropriate per her surgical protocol  PLAN:  PT FREQUENCY: 2-3x/week  PT DURATION: 6 weeks  PLANNED INTERVENTIONS: 97164- PT Re-evaluation, 97110-Therapeutic exercises, 97530- Therapeutic activity, 97112- Neuromuscular re-education,  97535- Self Care, 78295- Manual therapy, 97014- Electrical stimulation (unattended), 97016- Vasopneumatic device, Patient/Family education, Taping, Joint mobilization, Cryotherapy, and Moist heat  PLAN FOR NEXT SESSION: Continue as per protocol. AAROM/PROM, AROM   Aleksandar Duve,CHRIS, PTA 09/22/2023, 5:59 PM

## 2023-09-24 ENCOUNTER — Encounter: Payer: BC Managed Care – PPO | Admitting: *Deleted

## 2023-09-29 ENCOUNTER — Ambulatory Visit: Payer: BC Managed Care – PPO | Admitting: *Deleted

## 2023-09-29 ENCOUNTER — Encounter: Payer: Self-pay | Admitting: *Deleted

## 2023-09-29 DIAGNOSIS — M25611 Stiffness of right shoulder, not elsewhere classified: Secondary | ICD-10-CM

## 2023-09-29 DIAGNOSIS — M25511 Pain in right shoulder: Secondary | ICD-10-CM

## 2023-09-29 DIAGNOSIS — M6281 Muscle weakness (generalized): Secondary | ICD-10-CM

## 2023-09-29 NOTE — Therapy (Signed)
OUTPATIENT PHYSICAL THERAPY SHOULDER TREATMENT   Patient Name: Robin Harrington Borel MRN: [id number] DOB:[birth year], 57 y.o., female Today's Date: 09/29/2023  END OF SESSION:  PT End of Session - 09/29/23 1649     Visit Number 14    Number of Visits 15    Date for PT Re-Evaluation 09/18/23    PT Start Time 1640    PT Stop Time 1730    PT Time Calculation (min) 50 min             Past Medical History:  Diagnosis Date   Acne    Allergy    Hyperlipidemia    History reviewed. No pertinent surgical history. 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   Anorectal disorder 01/11/2021   Anal spasm 01/11/2021   Patellofemoral arthritis 04/13/2019   Acute cystitis without hematuria 12/10/2018   Cervical pain 04/17/2017   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   Mixed hyperlipidemia 09/07/2016   Body mass index 30.0-30.9, adult 09/07/2016   Acne 09/07/2016   REFERRING PROVIDER: Jones Broom, MD  REFERRING DIAG: s/p Right Shoulder Scope  THERAPY DIAG:  Stiffness of right shoulder, not elsewhere classified  Acute pain of right shoulder  Muscle weakness (generalized)  Rationale for Evaluation and Treatment: Rehabilitation  ONSET DATE: 07/21/23  SUBJECTIVE:                                                                                                                                                                                      SUBJECTIVE STATEMENT: RT shldr pain today 2/10 and doing good. Sick last week. To MD 10-07-23   Hand dominance: Right  PERTINENT HISTORY: Arthritis  and allergies  PAIN:  Are you having pain? Yes: NPRS scale: 2/10 Pain location: right shoulder Pain description: intermittent ache Aggravating factors: any arm movement Relieving factors: medication  PRECAUTIONS: Shoulder  RED FLAGS: None   WEIGHT BEARING RESTRICTIONS: Yes see protocol  FALLS:  Has patient fallen in last 6 months? 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  PATIENT SURVEYS:  FOTO 19.06  COGNITION: Overall cognitive status: Within functional limits for tasks assessed     SENSATION: Patient reports intermittent numbness and tingling   POSTURE: Forward head and rounded shoulders with RUE in sling   UPPER EXTREMITY ROM:   Active ROM Right eval (PROM) Left eval  Shoulder flexion 35 157  Shoulder extension    Shoulder abduction 30 154  Shoulder adduction    Shoulder internal rotation To abdomen To T7  Shoulder external rotation 5 To T4  Elbow flexion    Elbow extension    Wrist flexion    Wrist extension    Wrist ulnar deviation    Wrist radial deviation    Wrist pronation    Wrist supination    (Blank rows = not tested)  UPPER EXTREMITY MMT: Not tested due to surgical condition  SHOULDER SPECIAL TESTS: Not tested due to surgical condition  JOINT MOBILITY TESTING:  Unable to assess due to pain   PALPATION:  No tenderness to palpation reported   TODAY'S TREATMENT:                                                                                                                                         DATE:                                                             09/22/23                                    EXERCISE  LOG    RT shldr SX 07-21-23                   10 weeks  09-29-23  Exercise Repetitions and Resistance Comments  Pulleys X 6 mins Forward and lateral   Standing UE ranger X 6 mins   flexion/ ext,CW/CCW   UBE X 7 mins   Ball Wall Rolls  3x10   Rows Red band 2x10   Ext Red band 2x10   IR Red  2x10   ER Yellow 2x10   AROM flexion/ABD  x20   Supine CW/CCW 1# 2x10 each way    Blank cell = exercise not performed today   Manual   AAROM and  PROM to RT shldr flexion ABD, and ER supine with end-range holds Rhythmic stab for Elevation and MAT for ER.       PATIENT EDUCATION: Education details: See below Person educated: Patient,  Education method: Explanation, Demo,  Education comprehension: verbalized understanding, handout  HOME EXERCISE PROGRAM:  WAND EXTERNAL ROTATION - SUPINE ER  Lie on your back holding a cane or wand with both hands.  On the affected side, place a small rolled up towel or pillow under your elbow. Maintain approx. 90 degree bend at the elbow with your arm approximately 30-45 degrees away from your side.  Use your other arm to pull the wand/cane to rotate the affected arm back into a stretch. Hold and then return to starting position and then repeat.   Video # [id number]  Repeat 10 Times Hold 10 Seconds Complete 2 Sets Perform 3 Times a Day   ASSESSMENT:  CLINICAL IMPRESSION: Pt arrived today doing fairly  well again with RT shldr. She was able to continue with AAROM ,AROM, and light strengthening t band exs. She was able to perform all exs with mainly fatigue. Soreness still with PROM.  08/27/23 PROGRESS REPORT:  Patient is making good progress with skilled physical therapy within her surgical protocol as she has meet all of her short term goals for therapy. She will be continue to be progressed, as appropriate, to address her remaining impairments to return to her prior level of function.   [name], PT, DPT   OBJECTIVE IMPAIRMENTS: decreased  activity tolerance, decreased knowledge of condition, decreased mobility, decreased ROM, decreased strength, hypomobility, impaired sensation, impaired tone, impaired UE functional use, postural dysfunction, and pain.   ACTIVITY LIMITATIONS: carrying, lifting, sleeping, bathing, toileting, dressing, reach over head, and hygiene/grooming  PARTICIPATION LIMITATIONS: meal prep, cleaning, laundry, driving, shopping, community activity, occupation, and yard work  PERSONAL FACTORS: Behavior pattern and 1-2 comorbidities: Arthritis and allergies  are also affecting patient's functional outcome.   REHAB POTENTIAL: Good  CLINICAL DECISION MAKING: Stable/uncomplicated  EVALUATION COMPLEXITY: Low   GOALS: Goals reviewed with patient? Yes  SHORT TERM GOALS: Target date: 08/26/23  Patient will be independent with her initial HEP. Baseline: Goal status: MET  2.  Patient will improve her right shoulder passive range of motion to at least 90 degrees for improved shoulder mobility. Baseline:  Goal status: MET  3.  Patient will be able to demonstrate at least 20 degrees of passive right shoulder external rotation for improved shoulder mobility Baseline:  Goal status: MET  LONG TERM GOALS: Target date: 09/16/23  Patient will be independent with her advanced HEP. Baseline:  Goal status: On going  2.  Patient will be able to demonstrate at least 90 degrees of active right shoulder flexion for improved functional reaching. Baseline:  Goal status: MET  3.  Patient will report being able to get dressed with minimal to no difficulty. Baseline:  Goal status: MET  4.  Patient will be able to demonstrate at least 90 degrees of active right shoulder abduction for improved function reaching. Baseline:  Goal status: INITIAL  Goals will be updated and modified as needed throughout treatment as appropriate per her surgical protocol  PLAN:  PT FREQUENCY: 2-3x/week  PT DURATION: 6  weeks  PLANNED INTERVENTIONS: 97164- PT Re-evaluation, 97110-Therapeutic exercises, 97530- Therapeutic activity, 97112- Neuromuscular re-education, 97535- Self Care, 98119- Manual therapy, 97014- Electrical stimulation (unattended), 97016- Vasopneumatic device, Patient/Family education, Taping, Joint mobilization, Cryotherapy, and Moist heat  PLAN FOR NEXT SESSION: Continue as per protocol. AAROM/PROM, AROM   Cierrah Dace,CHRIS, PTA 09/29/2023, 5:53 PM

## 2023-10-01 ENCOUNTER — Ambulatory Visit: Payer: BC Managed Care – PPO | Admitting: *Deleted

## 2023-10-01 ENCOUNTER — Encounter: Payer: BC Managed Care – PPO | Admitting: *Deleted

## 2023-10-01 DIAGNOSIS — M25511 Pain in right shoulder: Secondary | ICD-10-CM

## 2023-10-01 DIAGNOSIS — M25611 Stiffness of right shoulder, not elsewhere classified: Secondary | ICD-10-CM

## 2023-10-01 DIAGNOSIS — M6281 Muscle weakness (generalized): Secondary | ICD-10-CM

## 2023-10-01 NOTE — Therapy (Addendum)
OUTPATIENT PHYSICAL THERAPY SHOULDER TREATMENT   Patient Name: Robin Harrington MRN: [id number] DOB:1966-02-22, 57 y.o., female Today's Date: 10/01/2023  END OF SESSION:  PT End of Session - 10/01/23 0814     Visit Number 15    Number of Visits 15    Date for PT Re-Evaluation 09/18/23    PT Start Time 0800    PT Stop Time 0835    PT Time Calculation (min) 35 min             Past Medical History:  Diagnosis Date   Acne    Allergy    Hyperlipidemia    No past surgical history on file. 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   Anorectal disorder 01/11/2021   Anal spasm 01/11/2021   Patellofemoral arthritis 04/13/2019   Acute cystitis without hematuria 12/10/2018   Cervical pain 04/17/2017   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   Mixed hyperlipidemia 09/07/2016   Body mass index 30.0-30.9, adult 09/07/2016   Acne 09/07/2016   REFERRING PROVIDER: Jones Broom, MD  REFERRING DIAG: s/p Right Shoulder Scope  THERAPY DIAG:  Stiffness of right shoulder, not elsewhere classified  Acute pain of right shoulder  Muscle weakness (generalized)  Rationale for Evaluation and Treatment: Rehabilitation  ONSET DATE: 07/21/23  SUBJECTIVE:                                                                                                                                                                                      SUBJECTIVE STATEMENT: RT shldr pain today 2/10 and doing good. Sick last week. To MD 10-07-23. Need to leave early today   Hand dominance: Right  PERTINENT  HISTORY: Arthritis and allergies  PAIN:  Are you having pain? Yes: NPRS scale: 2/10 Pain location: right shoulder Pain description: intermittent ache Aggravating factors: any arm movement Relieving factors: medication  PRECAUTIONS: Shoulder  RED FLAGS: None   WEIGHT BEARING RESTRICTIONS: Yes see protocol  FALLS:  Has patient fallen in last 6 months? 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  PATIENT SURVEYS:  FOTO 19.06  COGNITION: Overall cognitive status: Within functional limits for tasks assessed     SENSATION: Patient reports intermittent numbness and tingling   POSTURE: Forward head and rounded shoulders with RUE in sling   UPPER EXTREMITY ROM:   Active ROM Right eval (PROM) Left eval  Shoulder flexion 35 157  Shoulder extension    Shoulder abduction 30 154  Shoulder adduction    Shoulder internal rotation To abdomen To T7  Shoulder external rotation 5 To T4  Elbow flexion    Elbow extension    Wrist flexion    Wrist extension    Wrist ulnar deviation    Wrist radial deviation    Wrist pronation    Wrist supination    (Blank rows = not tested)  UPPER EXTREMITY MMT: Not tested due to surgical condition  SHOULDER SPECIAL TESTS: Not tested due to surgical condition  JOINT MOBILITY TESTING:  Unable to assess due to pain   PALPATION:  No tenderness to palpation reported   TODAY'S TREATMENT:                                                                                                                                         DATE:                                                             09/22/23     FOTO next visit                                     EXERCISE LOG    RT shldr SX 07-21-23                   10 weeks  09-29-23  Exercise Repetitions and Resistance Comments  Pulleys X 3 mins Forward and lateral   Standing UE ranger    UBE X 8 mins   Ball Wall Rolls     Rows Red band 2x10   Ext Red band 2x10   IR Red  2x10   ER Yellow 2x10   AROM flexion/ABD     Supine CW/CCW     Blank cell = exercise not performed today   Manual   AAROM and  PROM to RT shldr flexion 165,  ABD 165 , and ER 80 degrees at 90 degrees of ABD supine with end-range holds        PATIENT EDUCATION: Education details: See below Person educated: Patient,  Education method: Explanation, Demo,  Education comprehension:  verbalized understanding, handout  HOME EXERCISE PROGRAM: WAND EXTERNAL ROTATION - SUPINE ER  Lie on your back holding a cane or wand with both hands.  On the affected side, place a small rolled up towel or pillow under your elbow. Maintain approx. 90 degree bend at the elbow with your arm approximately 30-45 degrees away from your side.  Use your other arm to pull the wand/cane to rotate the affected arm back into a stretch. Hold and then return to starting position and then repeat.   Video # [id number]  Repeat 10 Times Hold 10 Seconds Complete 2 Sets Perform 3 Times a Day   ASSESSMENT:  CLINICAL IMPRESSION: Pt arrived today doing fairly  well  with RT shldr. And will F/U with MD next week. PROM RT shldr flexion and ABD to 165 degrees today and ER to 80 degrees at 90 degrees of ABD.  She was able to continue with AAROM ,AROM, and light strengthening t band exs. She was able to perform all exs with mainly fatigue. MD note  10/01/23 PROGRESS REPORT:  Patient is making good progress with skilled physical therapy within her surgical protocol as she has meet all of her initial short  and long term goals for therapy. Her goals were updated to reflect her progress and her need for continued physical therapy to  address her right upper extremity strength and functional mobility deficits. She will be continue to be progressed, as appropriate, to address her remaining impairments to return to her prior level of function. Recommend that she continue with her updated plan of care to address her remaining impairments to return to her prior level of function.    [name], PT, DPT   OBJECTIVE IMPAIRMENTS: decreased activity tolerance, decreased knowledge of condition, decreased mobility, decreased ROM, decreased strength, hypomobility, impaired sensation, impaired tone, impaired UE functional use, postural dysfunction, and pain.   ACTIVITY LIMITATIONS: carrying, lifting, sleeping, bathing, toileting, dressing, reach over head, and hygiene/grooming  PARTICIPATION LIMITATIONS: meal prep, cleaning, laundry, driving, shopping, community activity, occupation, and yard work  PERSONAL FACTORS: Behavior pattern and 1-2 comorbidities: Arthritis and allergies  are also affecting patient's functional outcome.   REHAB POTENTIAL: Good  CLINICAL DECISION MAKING: Stable/uncomplicated  EVALUATION COMPLEXITY: Low   GOALS: Goals reviewed with patient? Yes  SHORT TERM GOALS: Target date: 08/26/23  Patient will be independent with her initial HEP. Baseline: Goal status: MET  2.  Patient will improve her right shoulder passive range of motion to at least 90 degrees for improved shoulder mobility. Baseline:  Goal status: MET  3.  Patient will be able to demonstrate at least 20 degrees of passive right shoulder external rotation for improved shoulder mobility Baseline:  Goal status: MET  LONG TERM GOALS: Target date: 09/16/23  Patient will be independent with her advanced HEP. Baseline:  Goal status: On going  2.  Patient will be able to demonstrate at least 90 degrees of active right shoulder flexion for improved functional reaching. Baseline:  Goal status: MET  3.  Patient will report being able to get  dressed with minimal to no difficulty. Baseline:  Goal status: MET  4.  Patient will be able to demonstrate at least 90 degrees of active right shoulder abduction for improved function reaching. Baseline:  Goal status: MET  5.  Patient will be able to demonstrate functional right shoulder external rotation to at least T9 for improved function dressing. Baseline:  Goal status: INITIAL   6.  Patient will be able  to lift at least 5 pounds overhead with her right upper extremity for improved function with household activities.  Baseline:  Goal status: INITIAL  7.  Patient will be able to safely return to pickleball without being limited by her right shoulder.  Baseline:  Goal status: INITIAL    PLAN:  PT FREQUENCY: 2-3x/week  PT DURATION: 4 weeks  PLANNED INTERVENTIONS: 97164- PT Re-evaluation, 97110-Therapeutic exercises, 97530- Therapeutic activity, 97112- Neuromuscular re-education, 97535- Self Care, 78295- Manual therapy, 97014- Electrical stimulation (unattended), 97016- Vasopneumatic device, Patient/Family education, Taping, Joint mobilization, Cryotherapy, and Moist heat  PLAN FOR NEXT SESSION: Continue as per protocol. AAROM/PROM, AROM.  MD note   Theresa Dohrman,CHRIS, PTA 10/01/2023, 10:47 AM

## 2023-10-01 NOTE — Addendum Note (Signed)
Addended by: Granville Lewis on: 10/01/2023 05:45 PM   Modules accepted: Orders

## 2023-10-06 ENCOUNTER — Encounter: Payer: BC Managed Care – PPO | Admitting: *Deleted

## 2023-10-08 ENCOUNTER — Encounter: Payer: BC Managed Care – PPO | Admitting: *Deleted

## 2023-10-08 ENCOUNTER — Ambulatory Visit: Payer: BC Managed Care – PPO | Admitting: *Deleted

## 2023-10-08 DIAGNOSIS — M25611 Stiffness of right shoulder, not elsewhere classified: Secondary | ICD-10-CM

## 2023-10-08 DIAGNOSIS — M6281 Muscle weakness (generalized): Secondary | ICD-10-CM

## 2023-10-08 DIAGNOSIS — M25511 Pain in right shoulder: Secondary | ICD-10-CM

## 2023-10-08 NOTE — Therapy (Signed)
OUTPATIENT PHYSICAL THERAPY SHOULDER TREATMENT   Patient Name: Robin Harrington MRN: [id number] DOB:November 11, 1965, 57 y.o., female Today's Date: 10/08/2023  END OF SESSION:  PT End of Session - 10/08/23 1518     Visit Number 16    Number of Visits 23    Date for PT Re-Evaluation 11/20/23    PT Start Time 1430    PT Stop Time 1519    PT Time Calculation (min) 49 min             Past Medical History:  Diagnosis Date   Acne    Allergy    Hyperlipidemia    No past surgical history on file. 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   Anorectal disorder 01/11/2021   Anal spasm 01/11/2021   Patellofemoral arthritis 04/13/2019   Acute cystitis without hematuria 12/10/2018   Cervical pain 04/17/2017   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   Mixed hyperlipidemia 09/07/2016   Body mass index 30.0-30.9, adult 09/07/2016   Acne 09/07/2016   REFERRING PROVIDER: Jones Broom, MD  REFERRING DIAG: s/p Right Shoulder Scope  THERAPY DIAG:  Stiffness of right shoulder, not elsewhere classified  Acute pain of right shoulder  Muscle weakness (generalized)  Rationale for Evaluation and Treatment: Rehabilitation  ONSET DATE: 07/21/23  SUBJECTIVE:                                                                                                                                                                                      SUBJECTIVE STATEMENT: RT shldr pain today 2/10 and doing good. Sick last week. To MD 10-07-23. Need to leave early today   Hand dominance: Right  PERTINENT  HISTORY: Arthritis and allergies  PAIN:  Are you having pain? Yes: NPRS scale: 2/10 Pain location: right shoulder Pain description: intermittent ache Aggravating factors: any arm movement Relieving factors: medication  PRECAUTIONS: Shoulder  RED FLAGS: None   WEIGHT BEARING RESTRICTIONS: Yes see protocol  FALLS:  Has patient fallen in last 6 months? 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  PATIENT SURVEYS:  FOTO 19.06  COGNITION: Overall cognitive status: Within functional limits for tasks assessed     SENSATION: Patient reports intermittent numbness and tingling   POSTURE: Forward head and rounded shoulders with RUE in sling   UPPER EXTREMITY ROM:   Active ROM Right eval (PROM) Left eval  Shoulder flexion 35 157  Shoulder extension    Shoulder abduction 30 154  Shoulder adduction    Shoulder internal rotation To abdomen To T7  Shoulder external rotation 5 To T4  Elbow flexion    Elbow extension    Wrist flexion    Wrist extension    Wrist ulnar deviation    Wrist radial deviation    Wrist pronation    Wrist supination    (Blank rows = not tested)  UPPER EXTREMITY MMT: Not tested due to surgical condition  SHOULDER SPECIAL TESTS: Not tested due to surgical condition  JOINT MOBILITY TESTING:  Unable to assess due to pain   PALPATION:  No tenderness to palpation reported   TODAY'S TREATMENT:                                                                                                                                         DATE:                                                             10/08/23     FOTO next visit                                     EXERCISE LOG    RT shldr SX 07-21-23                   10 weeks  09-29-23  Exercise Repetitions and Resistance Comments  Pulleys X 3 mins Forward and lateral   Standing UE ranger    UBE X 8 mins   Ball Wall Rolls     Rows Red band 2x10   Ext Red band 2x10   IR Red  2x10   ER Yellow 2x10   AROM flexion/ABD     Supine CW/CCW    Bicep curl 3#3x10   Tricep pushdown Blue 3x10   Flexion to 90 degrees 1# 3x10    Blank cell = exercise not performed today   Manual   AAROM and  PROM to RT shldr flexion 165,  ABD 165 , and ER 80 degrees at 90 degrees of ABD supine with end-range holds  PATIENT EDUCATION: Education details: See below Person educated: Patient,  Education method: Explanation, Demo,  Education comprehension: verbalized understanding, handout  HOME EXERCISE PROGRAM: WAND EXTERNAL ROTATION - SUPINE ER  Lie on your back holding a cane or wand with both hands.  On the affected side, place a small rolled up towel or pillow under your elbow. Maintain approx. 90 degree bend at the elbow with your arm approximately 30-45 degrees away from your side.  Use your other arm to pull the wand/cane to rotate the affected arm back into a stretch. Hold and then return to starting position and then repeat.   Video # [id number]  Repeat 10 Times Hold 10 Seconds Complete 2 Sets Perform 3 Times a Day   ASSESSMENT:  CLINICAL IMPRESSION: Pt arrived today doing fairly  well  with RT shldr.  And reports good MD f/u. He was pleased with progress and reports that she can start progressing strengthening. Pt introduced to to new exs for light strengthening and did well.  10/01/23 PROGRESS REPORT:  Patient is making good progress with skilled physical therapy within her surgical protocol as she has meet all of her initial short  and long term goals for therapy. Her goals were updated to reflect her progress and her need for continued physical therapy to  address her right upper extremity strength and functional mobility deficits. She will be continue to be progressed, as appropriate, to address her remaining impairments to return to her prior level of function. Recommend that she continue with her updated plan of care to address her remaining impairments to return to her prior level of function.    [name], PT, DPT   OBJECTIVE IMPAIRMENTS: decreased activity tolerance, decreased knowledge of condition, decreased mobility, decreased ROM, decreased strength, hypomobility, impaired sensation, impaired tone, impaired UE functional use, postural dysfunction, and pain.   ACTIVITY LIMITATIONS: carrying, lifting, sleeping, bathing, toileting, dressing, reach over head, and hygiene/grooming  PARTICIPATION LIMITATIONS: meal prep, cleaning, laundry, driving, shopping, community activity, occupation, and yard work  PERSONAL FACTORS: Behavior pattern and 1-2 comorbidities: Arthritis and allergies  are also affecting patient's functional outcome.   REHAB POTENTIAL: Good  CLINICAL DECISION MAKING: Stable/uncomplicated  EVALUATION COMPLEXITY: Low   GOALS: Goals reviewed with patient? Yes  SHORT TERM GOALS: Target date: 08/26/23  Patient will be independent with her initial HEP. Baseline: Goal status: MET  2.  Patient will improve her right shoulder passive range of motion to at least 90 degrees for improved shoulder mobility. Baseline:  Goal status: MET  3.  Patient will be able to demonstrate at least 20 degrees of passive right shoulder external rotation for improved shoulder mobility Baseline:  Goal status: MET  LONG TERM GOALS: Target date: 09/16/23  Patient will be independent with her advanced HEP. Baseline:  Goal status: On going  2.  Patient will be able to demonstrate at least 90 degrees of active right shoulder flexion for improved functional reaching. Baseline:  Goal status: MET  3.  Patient will report being able to get  dressed with minimal to no difficulty. Baseline:  Goal status: MET  4.  Patient will be able to demonstrate at least 90 degrees of active right shoulder abduction for improved function reaching. Baseline:  Goal status: MET  5.  Patient will be able to demonstrate functional right shoulder external rotation to at least T9 for improved function dressing. Baseline:  Goal status: INITIAL   6.  Patient will be able to lift at least 5 pounds  overhead with her right upper extremity for improved function with household activities.  Baseline:  Goal status: INITIAL  7.  Patient will be able to safely return to pickleball without being limited by her right shoulder.  Baseline:  Goal status: INITIAL    PLAN:  PT FREQUENCY: 2-3x/week  PT DURATION: 4 weeks  PLANNED INTERVENTIONS: 97164- PT Re-evaluation, 97110-Therapeutic exercises, 97530- Therapeutic activity, 97112- Neuromuscular re-education, 97535- Self Care, 78295- Manual therapy, 97014- Electrical stimulation (unattended), 97016- Vasopneumatic device, Patient/Family education, Taping, Joint mobilization, Cryotherapy, and Moist heat  PLAN FOR NEXT SESSION: Continue as per protocol. Start strengthening as per MD   Maureen Duesing,CHRIS, PTA 10/08/2023, 5:11 PM

## 2023-10-08 NOTE — Progress Notes (Unsigned)
[name] [address] [place] [address] Phone: [phone] Subjective:   [name], am serving as a scribe for Dr. Antoine Primas.  I'm seeing this patient by the request  of:  [name], FNP  CC: left shoulder pain f/u   [id number]  06/24/2023 Patient's MRI unfortunately does show that she has a near complete tear noted with no significant retraction.  Patient is highly active and would like to continue to do so.  I think at this point secondary to patient having multiple different tears including the supraspinatus, infraspinatus as well as the bicep tendon when reviewing the MRI I would like patient to check with the surgeon to see if this would be beneficial.  Failed all other conservative therapy at this time.      Update 10/12/2023 Aalyah Werline Sollenberger is a 57 y.o. female coming in with complaint of L shoulder pain. Last seen in Sept for R shoulder pain. Surgery on R shoulder October 1st. Patient states that she was in a sling on R side and her wrist started to hurt over distal radius that radiates up into forearm.   Now having L shoulder pain but states that she is using that arm more due to surgery. Pain in shoulder will move from anterior to posterior and into the bicep. Playing pickleball L handed.      Past Medical History:  Diagnosis Date   Acne    Allergy    Hyperlipidemia    No past surgical history on file. Social History   Socioeconomic History   Marital status: Divorced    Spouse name: Not on file   Number of children: Not on file   Years of education: Not on file   Highest education level: Master's degree (e.g., MA, MS, MEng, MEd, MSW, MBA)  Occupational History   Not on file  Tobacco Use   Smoking status: Never   Smokeless tobacco: Never  Vaping Use   Vaping status: Never Used  Substance and Sexual Activity   Alcohol use: No   Drug use: No   Sexual activity: Not on file  Other Topics Concern   Not on  file  Social History Narrative   Not on file   Social Drivers of Health   Financial Resource Strain: Low Risk  (06/01/2023)   Overall Financial Resource Strain (CARDIA)    Difficulty of Paying Living Expenses: Not very hard  Food Insecurity: No Food Insecurity (06/01/2023)   Hunger Vital Sign    Worried About Running Out of Food in the Last Year: Never true    Ran Out of Food in the Last Year: Never true  Transportation Needs: No Transportation Needs (06/01/2023)   PRAPARE - Administrator, Civil Service (Medical): No    Lack of Transportation (Non-Medical): No  Physical Activity: Sufficiently Active (06/01/2023)   Exercise Vital Sign    Days of Exercise per Week: 4 days    Minutes of Exercise per Session: 90 min  Stress: No Stress Concern Present (06/01/2023)   [employer] of Occupational Health - Occupational Stress Questionnaire    Feeling of Stress : Only a little  Social Connections: Moderately Integrated (06/01/2023)   Social Connection and Isolation Panel [NHANES]    Frequency of Communication with Friends and Family: More than three times a week    Frequency of Social Gatherings with Friends and Family: More than three times a week    Attends Religious Services:  More than 4 times per year    Active Member of Clubs or Organizations: Yes    Attends Banker Meetings: More than 4 times per year    Marital Status: Divorced   No Known Allergies Family History  Problem Relation Age of Onset   Hypertension Mother    Atrial fibrillation Mother    Cancer Mother        melanoma    Cancer Father        colon    Heart disease Father    Breast cancer Neg Hx      Current Outpatient Medications (Cardiovascular):    rosuvastatin (CRESTOR) 40 MG tablet, Take 1 tablet (40 mg total) by mouth daily.  Current Outpatient Medications (Respiratory):    fluticasone (FLONASE) 50 MCG/ACT nasal spray, Place 2 sprays into both nostrils daily.  Current Outpatient  Medications (Analgesics):    meloxicam (MOBIC) 15 MG tablet, Take 1 tablet (15 mg total) by mouth daily.   Current Outpatient Medications (Other):    Doxycycline, Rosacea, (ORACEA) 40 MG CPDR, Take 40 mg by mouth daily.   gabapentin (NEURONTIN) 100 MG capsule, Take 2 capsules (200 mg total) by mouth at bedtime.   omeprazole (PRILOSEC) 40 MG capsule, Take 1 capsule (40 mg total) by mouth daily.   valACYclovir (VALTREX) 1000 MG tablet, Take 1 tablet (1,000 mg total) by mouth daily.   zolpidem (AMBIEN) 5 MG tablet, Take 1 tablet (5 mg total) by mouth at bedtime as needed. for sleep   Reviewed prior external information including notes and imaging from  primary care provider As well as notes that were available from care everywhere and other healthcare systems.  Past medical history, social, surgical and family history all reviewed in electronic medical record.  No pertanent information unless stated regarding to the chief complaint.   Review of Systems:  No headache, visual changes, nausea, vomiting, diarrhea, constipation, dizziness, abdominal pain, skin rash, fevers, chills, night sweats, weight loss, swollen lymph nodes, body aches, joint swelling, chest pain, shortness of breath, mood changes. POSITIVE muscle aches  Objective  Blood pressure 138/76, pulse (!) 58, height 5\' 5"  (1.651 m), weight 170 lb (77.1 kg), SpO2 98%.   General: No apparent distress alert and oriented x3 mood and affect normal, dressed appropriately.  HEENT: Pupils equal, extraocular movements intact  Respiratory: Patient's speak in full sentences and does not appear short of breath  Cardiovascular: No lower extremity edema, non tender, no erythema  Left shoulder shows good range of motion noted.  Mild impingement noted. Right wrist though has a positive Tinel's noted.  No thenar eminence wasting noted.  Good grip strength noted.  Limited muscular skeletal ultrasound was performed and interpreted by Antoine Primas,  M  Limited ultrasound of patient's left shoulder shows very mild hypoechoic changes significant for subacromial bursitis but otherwise rotator cuff appears to be intact.  Regarding patient's wrist, patient does have significant hypoechoic changes as well as dilatation of the of the median nerve consistent with carpal tunnel moderate in severity. Impression: Subacromial bursitis of the left shoulder as well as carpal tunnel on the right wrist    Impression and Recommendations:    The above documentation has been reviewed and is accurate and complete Judi Saa, DO

## 2023-10-12 ENCOUNTER — Other Ambulatory Visit: Payer: Self-pay

## 2023-10-12 ENCOUNTER — Encounter: Payer: Self-pay | Admitting: Family Medicine

## 2023-10-12 ENCOUNTER — Ambulatory Visit: Payer: BC Managed Care – PPO | Admitting: Family Medicine

## 2023-10-12 VITALS — BP 138/76 | HR 58 | Ht 65.0 in | Wt 170.0 lb

## 2023-10-12 DIAGNOSIS — G5601 Carpal tunnel syndrome, right upper limb: Secondary | ICD-10-CM | POA: Diagnosis not present

## 2023-10-12 DIAGNOSIS — M75121 Complete rotator cuff tear or rupture of right shoulder, not specified as traumatic: Secondary | ICD-10-CM

## 2023-10-12 DIAGNOSIS — M25512 Pain in left shoulder: Secondary | ICD-10-CM | POA: Diagnosis not present

## 2023-10-12 MED ORDER — GABAPENTIN 100 MG PO CAPS: 200.0000 mg | ORAL_CAPSULE | Freq: Every day | ORAL | 0 refills | Status: DC

## 2023-10-12 NOTE — Assessment & Plan Note (Signed)
Surgical intervention did happen, unfortunately does have carpal tunnel bilaterally and seems to be right greater than left.  Will monitor closely.  Discussed icing regimen and home exercises.  Likely mild shoulder pain only secondary to compensation.  Nothing on ultrasound that makes me concerned.  Follow-up again in 6 to 8 weeks.

## 2023-10-12 NOTE — Assessment & Plan Note (Signed)
We discussed the anatomy involved, and that carpal tunnel syndrome primarily involves the median nerve, and this typically affects digits one through 3.  ° °We also discussed that mild cases of carpal tunnel syndrome are often improved with night splints.  If symptoms persist it is very reasonable to consider a carpal tunnel injection.  ° °If the patient does have moderate to severe carpal tunnel syndrome based on NCV, then it is certainly reasonable to consider surgical consultation for definitive management possible carpal tunnel release.  We also discussed his severe carpal tunnel syndrome can lead to permanent nerve impairment even if released. At this point, the patient like to proceed conservatively. ° °

## 2023-10-12 NOTE — Patient Instructions (Signed)
You have 14 days to return or exchange your brace Call [phone], then return the brace to our office Brace day and night for 2 weeks Then only at night for another 2 weeks Do prescribed exercises at least 3x a week Gabapentin 200mg  See you again in 8 weeks

## 2023-10-13 ENCOUNTER — Ambulatory Visit: Payer: BC Managed Care – PPO | Admitting: *Deleted

## 2023-10-13 ENCOUNTER — Encounter: Payer: Self-pay | Admitting: *Deleted

## 2023-10-13 DIAGNOSIS — M25511 Pain in right shoulder: Secondary | ICD-10-CM

## 2023-10-13 DIAGNOSIS — M6281 Muscle weakness (generalized): Secondary | ICD-10-CM

## 2023-10-13 DIAGNOSIS — M25611 Stiffness of right shoulder, not elsewhere classified: Secondary | ICD-10-CM | POA: Diagnosis not present

## 2023-10-13 NOTE — Therapy (Signed)
OUTPATIENT PHYSICAL THERAPY SHOULDER TREATMENT   Patient Name: Robin Harrington MRN: [id number] DOB:November 25, 1965, 57 y.o., female Today's Date: 10/13/2023  END OF SESSION:  PT End of Session - 10/13/23 1035     Visit Number 17    Number of Visits 23    Date for PT Re-Evaluation 11/20/23    PT Start Time 1015    PT Stop Time 1105    PT Time Calculation (min) 50 min             Past Medical History:  Diagnosis Date   Acne    Allergy    Hyperlipidemia    History reviewed. No pertinent surgical history. 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   Anorectal disorder 01/11/2021   Anal spasm 01/11/2021   Patellofemoral arthritis 04/13/2019   Acute cystitis without hematuria 12/10/2018   Cervical pain 04/17/2017   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   Mixed hyperlipidemia 09/07/2016   Body mass index 30.0-30.9, adult 09/07/2016   Acne 09/07/2016   REFERRING PROVIDER: Jones Broom, MD  REFERRING DIAG: s/p Right Shoulder Scope  THERAPY DIAG:  Stiffness of right shoulder, not elsewhere classified  Acute pain of right shoulder  Muscle weakness (generalized)  Rationale for Evaluation and Treatment: Rehabilitation  ONSET DATE: 07/21/23  SUBJECTIVE:                                                                                                                                                                                      SUBJECTIVE STATEMENT: RT shldr pain today 2/10 and doing good. Carpal tunnel RT wrist   Hand dominance:  Right  PERTINENT HISTORY: Arthritis and allergies  PAIN:  Are you having pain? Yes: NPRS scale: 2/10 Pain location: right shoulder Pain description: intermittent ache Aggravating factors: any arm movement Relieving factors: medication  PRECAUTIONS: Shoulder  RED FLAGS: None   WEIGHT BEARING RESTRICTIONS: Yes see protocol  FALLS:  Has patient fallen in last 6 months? 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  PATIENT SURVEYS:  FOTO 19.06  COGNITION: Overall cognitive status: Within functional limits for tasks assessed     SENSATION: Patient reports intermittent numbness and tingling   POSTURE: Forward head and rounded shoulders with RUE in sling   UPPER EXTREMITY ROM:   Active ROM Right eval (PROM) Left eval  Shoulder flexion 35 157  Shoulder extension    Shoulder abduction 30 154  Shoulder adduction    Shoulder internal rotation To abdomen To T7  Shoulder external rotation 5 To T4  Elbow flexion    Elbow extension    Wrist flexion    Wrist extension    Wrist ulnar deviation    Wrist radial deviation    Wrist pronation    Wrist supination    (Blank rows = not tested)  UPPER EXTREMITY MMT: Not tested due to surgical condition  SHOULDER SPECIAL TESTS: Not tested due to surgical condition  JOINT MOBILITY TESTING:  Unable to assess due to pain   PALPATION:  No tenderness to palpation reported   TODAY'S TREATMENT:                                                                                                                                         DATE:                                                             10/13/23      FOTO next visit                                    EXERCISE LOG    RT shldr SX 07-21-23                   12 weeks  10-13-23  Exercise Repetitions and Resistance Comments  Pulleys  Forward and lateral   Standing UE ranger    UBE X 8 mins    60 RPMs   Ball Wall Rolls     Rows Red band 2x10   Ext Red band 2x10   IR Red  2x10   ER Yellow 2x10   AROM flexion/ABD     Supine CW/CCW    Bicep curl 3#3x10   Tricep pushdown Blue 3x10   Flexion / ABD to 90 degrees 1# 3x10    Blank cell = exercise not performed today   Manual   AAROM and  PROM to RT shldr flexion and ABD        PATIENT EDUCATION: Education details: See below Person educated: Patient,  Education method: Explanation, Demo,  Education comprehension: verbalized understanding, handout  HOME EXERCISE PROGRAM: WAND EXTERNAL ROTATION - SUPINE ER  Lie on your back holding a cane or wand with both hands.  On the affected side, place a small rolled up towel or pillow under your elbow. Maintain approx. 90 degree bend at the elbow with your arm approximately 30-45 degrees away from your side.  Use your other arm to pull the wand/cane to rotate the affected arm back into a stretch. Hold and then return to starting position and then repeat.   Video # [id number]  Repeat 10 Times Hold 10 Seconds Complete 2 Sets Perform 3 Times a Day   ASSESSMENT:  CLINICAL IMPRESSION: Pt arrived today doing fairly  well  with RT shldr.  She was able to continue with RT UE strengthening and ROM exs as well as PROM for end-range progressions and did well.    10/01/23 PROGRESS REPORT:  Patient is making good progress with skilled physical therapy within her surgical protocol as she has meet all of her initial short  and long term goals for therapy. Her goals were updated to reflect her progress and her need for continued physical therapy to address her right upper extremity strength and functional mobility deficits. She will be continue to be  progressed, as appropriate, to address her remaining impairments to return to her prior level of function. Recommend that she continue with her updated plan of care to address her remaining impairments to return to her prior level of function.    [name], PT, DPT   OBJECTIVE IMPAIRMENTS: decreased activity tolerance, decreased knowledge of condition, decreased mobility, decreased ROM, decreased strength, hypomobility, impaired sensation, impaired tone, impaired UE functional use, postural dysfunction, and pain.   ACTIVITY LIMITATIONS: carrying, lifting, sleeping, bathing, toileting, dressing, reach over head, and hygiene/grooming  PARTICIPATION LIMITATIONS: meal prep, cleaning, laundry, driving, shopping, community activity, occupation, and yard work  PERSONAL FACTORS: Behavior pattern and 1-2 comorbidities: Arthritis and allergies  are also affecting patient's functional outcome.   REHAB POTENTIAL: Good  CLINICAL DECISION MAKING: Stable/uncomplicated  EVALUATION COMPLEXITY: Low   GOALS: Goals reviewed with patient? Yes  SHORT TERM GOALS: Target date: 08/26/23  Patient will be independent with her initial HEP. Baseline: Goal status: MET  2.  Patient will improve her right shoulder passive range of motion to at least 90 degrees for improved shoulder mobility. Baseline:  Goal status: MET  3.  Patient will be able to demonstrate at least 20 degrees of passive right shoulder external rotation for improved shoulder mobility Baseline:  Goal status: MET  LONG TERM GOALS: Target date: 09/16/23  Patient will be independent with her advanced HEP. Baseline:  Goal status: On going  2.  Patient will be able to demonstrate at least 90 degrees of active right shoulder flexion for improved functional reaching. Baseline:  Goal status: MET  3.  Patient will report being able to get dressed with minimal to no difficulty. Baseline:  Goal status: MET  4.  Patient will be able to  demonstrate at least 90 degrees of active right shoulder abduction for improved function reaching. Baseline:  Goal status: MET  5.  Patient will be able to demonstrate functional right shoulder external rotation to at least T9 for improved function dressing. Baseline:  Goal status: INITIAL   6.  Patient will be able to lift at least 5 pounds overhead with her right upper extremity for improved function with household activities.  Baseline:  Goal  status: INITIAL  7.  Patient will be able to safely return to pickleball without being limited by her right shoulder.  Baseline:  Goal status: INITIAL    PLAN:  PT FREQUENCY: 2-3x/week  PT DURATION: 4 weeks  PLANNED INTERVENTIONS: 97164- PT Re-evaluation, 97110-Therapeutic exercises, 97530- Therapeutic activity, 97112- Neuromuscular re-education, 97535- Self Care, 24401- Manual therapy, 97014- Electrical stimulation (unattended), 97016- Vasopneumatic device, Patient/Family education, Taping, Joint mobilization, Cryotherapy, and Moist heat  PLAN FOR NEXT SESSION: Continue as per protocol. Start strengthening as per MD   Shanna Un,CHRIS, PTA 10/13/2023, 12:32 PM

## 2023-10-15 ENCOUNTER — Encounter: Payer: Self-pay | Admitting: *Deleted

## 2023-10-15 ENCOUNTER — Ambulatory Visit: Payer: BC Managed Care – PPO | Admitting: *Deleted

## 2023-10-15 ENCOUNTER — Encounter: Payer: BC Managed Care – PPO | Admitting: *Deleted

## 2023-10-15 DIAGNOSIS — M25611 Stiffness of right shoulder, not elsewhere classified: Secondary | ICD-10-CM

## 2023-10-15 DIAGNOSIS — M25511 Pain in right shoulder: Secondary | ICD-10-CM

## 2023-10-15 DIAGNOSIS — M6281 Muscle weakness (generalized): Secondary | ICD-10-CM

## 2023-10-15 NOTE — Therapy (Signed)
OUTPATIENT PHYSICAL THERAPY SHOULDER TREATMENT   Patient Name: Robin Harrington MRN: [id number] DOB:11/16/1965, 57 y.o., female Today's Date: 10/15/2023  END OF SESSION:  PT End of Session - 10/15/23 1021     Visit Number 18    Number of Visits 23    Date for PT Re-Evaluation 11/20/23    PT Start Time 1015    PT Stop Time 1100    PT Time Calculation (min) 45 min             Past Medical History:  Diagnosis Date   Acne    Allergy    Hyperlipidemia    History reviewed. No pertinent surgical history. 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   Anorectal disorder 01/11/2021   Anal spasm 01/11/2021   Patellofemoral arthritis 04/13/2019   Acute cystitis without hematuria 12/10/2018   Cervical pain 04/17/2017   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   Mixed hyperlipidemia 09/07/2016   Body mass index 30.0-30.9, adult 09/07/2016   Acne 09/07/2016   REFERRING PROVIDER: Jones Broom, MD  REFERRING DIAG: s/p Right Shoulder Scope  THERAPY DIAG:  Stiffness of right shoulder, not elsewhere classified  Acute pain of right shoulder  Muscle weakness (generalized)  Rationale for Evaluation and Treatment: Rehabilitation  ONSET DATE: 07/21/23  SUBJECTIVE:                                                                                                                                                                                      SUBJECTIVE STATEMENT: RT shldr pain today 1-2/10 and doing good. OOT next week   Hand dominance:  Right  PERTINENT HISTORY: Arthritis and allergies  PAIN:  Are you having pain? Yes: NPRS scale: 2/10 Pain location: right shoulder Pain description: intermittent ache Aggravating factors: any arm movement Relieving factors: medication  PRECAUTIONS: Shoulder  RED FLAGS: None   WEIGHT BEARING RESTRICTIONS: Yes see protocol  FALLS:  Has patient fallen in last 6 months? 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  PATIENT SURVEYS:  FOTO 19.06  COGNITION: Overall cognitive status: Within functional limits for tasks assessed     SENSATION: Patient reports intermittent numbness and tingling   POSTURE: Forward head and rounded shoulders with RUE in sling   UPPER EXTREMITY ROM:   Active ROM Right eval (PROM) Left eval  Shoulder flexion 35 157  Shoulder extension    Shoulder abduction 30 154  Shoulder adduction    Shoulder internal rotation To abdomen To T7  Shoulder external rotation 5 To T4  Elbow flexion    Elbow extension    Wrist flexion    Wrist extension    Wrist ulnar deviation    Wrist radial deviation    Wrist pronation    Wrist supination    (Blank rows = not tested)  UPPER EXTREMITY MMT: Not tested due to surgical condition  SHOULDER SPECIAL TESTS: Not tested due to surgical condition  JOINT MOBILITY TESTING:  Unable to assess due to pain   PALPATION:  No tenderness to palpation reported   TODAY'S TREATMENT:                                                                                                                                         DATE:                                                             10/15/23      FOTO next visit                                    EXERCISE LOG    RT shldr SX 07-21-23                   12 weeks  10-13-23  Exercise Repetitions and Resistance Comments  Pulleys  Forward and lateral   Standing UE ranger    UBE X 8 mins    60 RPMs   Ball Wall Rolls     Rows Red band 3 x12   Ext Red band 3 x12   IR Red  3 x12   ER Yellow 2x10   AROM flexion/ABD     Supine CW/CCW    Bicep curl 3# 3x10   Tricep pushdown Blue 3x10   Flexion / ABD to 90 degrees 1# 3x10    Blank cell = exercise not performed today   Manual:  AAROM and  PROM to RT shldr flexion and ABD        PATIENT EDUCATION: Education details: See below Person  educated: Patient,  Education method: Explanation, Demo,  Education comprehension: verbalized understanding, handout  HOME EXERCISE PROGRAM: WAND EXTERNAL ROTATION - SUPINE ER  Lie on your back holding a cane or wand with both hands.  On the affected side, place a small rolled up towel or pillow under your elbow. Maintain approx. 90 degree bend at the elbow with your arm approximately 30-45 degrees away from your side.  Use your other arm to pull the wand/cane to rotate the affected arm back into a stretch. Hold and then return to starting position and then repeat.   Video # [id number]  Repeat 10 Times Hold 10 Seconds Complete 2 Sets Perform 3 Times a Day   ASSESSMENT:  CLINICAL IMPRESSION: Pt arrived today doing well  with RT shldr.  Rx focused on RT UE ROM and light strengthening as well as HEP due to Pt being OOT next week.   10/01/23 PROGRESS REPORT:  Patient is making good progress with skilled physical therapy within her surgical protocol as she has meet all of her initial short  and long term goals for therapy. Her goals were updated to reflect her progress and her need for continued physical therapy to address her right upper extremity strength and functional mobility deficits. She will be continue to be progressed, as appropriate, to  address her remaining impairments to return to her prior level of function. Recommend that she continue with her updated plan of care to address her remaining impairments to return to her prior level of function.    [name], PT, DPT   OBJECTIVE IMPAIRMENTS: decreased activity tolerance, decreased knowledge of condition, decreased mobility, decreased ROM, decreased strength, hypomobility, impaired sensation, impaired tone, impaired UE functional use, postural dysfunction, and pain.   ACTIVITY LIMITATIONS: carrying, lifting, sleeping, bathing, toileting, dressing, reach over head, and hygiene/grooming  PARTICIPATION LIMITATIONS: meal prep, cleaning, laundry, driving, shopping, community activity, occupation, and yard work  PERSONAL FACTORS: Behavior pattern and 1-2 comorbidities: Arthritis and allergies  are also affecting patient's functional outcome.   REHAB POTENTIAL: Good  CLINICAL DECISION MAKING: Stable/uncomplicated  EVALUATION COMPLEXITY: Low   GOALS: Goals reviewed with patient? Yes  SHORT TERM GOALS: Target date: 08/26/23  Patient will be independent with her initial HEP. Baseline: Goal status: MET  2.  Patient will improve her right shoulder passive range of motion to at least 90 degrees for improved shoulder mobility. Baseline:  Goal status: MET  3.  Patient will be able to demonstrate at least 20 degrees of passive right shoulder external rotation for improved shoulder mobility Baseline:  Goal status: MET  LONG TERM GOALS: Target date: 09/16/23  Patient will be independent with her advanced HEP. Baseline:  Goal status: On going  2.  Patient will be able to demonstrate at least 90 degrees of active right shoulder flexion for improved functional reaching. Baseline:  Goal status: MET  3.  Patient will report being able to get dressed with minimal to no difficulty. Baseline:  Goal status: MET  4.  Patient will be able to demonstrate at least 90 degrees of  active right shoulder abduction for improved function reaching. Baseline:  Goal status: MET  5.  Patient will be able to demonstrate functional right shoulder external rotation to at least T9 for improved function dressing. Baseline:  Goal status: INITIAL   6.  Patient will be able to lift at least 5 pounds overhead with her right upper extremity for improved function with household activities.  Baseline:  Goal status: INITIAL  7.  Patient will be able to safely return to pickleball without being limited by her right shoulder.  Baseline:  Goal status: INITIAL    PLAN:  PT FREQUENCY: 2-3x/week  PT DURATION: 4 weeks  PLANNED INTERVENTIONS: 97164- PT Re-evaluation, 97110-Therapeutic exercises, 97530- Therapeutic activity, 97112- Neuromuscular re-education, 97535- Self Care, 09811- Manual therapy, 97014- Electrical stimulation (unattended), 97016- Vasopneumatic device, Patient/Family education, Taping, Joint mobilization, Cryotherapy, and Moist heat  PLAN FOR NEXT SESSION: Continue as per protocol. Start strengthening as per MD   Remie Mathison,CHRIS, PTA 10/15/2023, 11:09 AM

## 2023-10-22 ENCOUNTER — Encounter: Payer: BC Managed Care – PPO | Admitting: *Deleted

## 2023-10-26 ENCOUNTER — Ambulatory Visit: Payer: 59 | Attending: Orthopedic Surgery | Admitting: *Deleted

## 2023-10-26 ENCOUNTER — Encounter: Payer: Self-pay | Admitting: *Deleted

## 2023-10-26 DIAGNOSIS — M6281 Muscle weakness (generalized): Secondary | ICD-10-CM | POA: Insufficient documentation

## 2023-10-26 DIAGNOSIS — M25611 Stiffness of right shoulder, not elsewhere classified: Secondary | ICD-10-CM | POA: Diagnosis present

## 2023-10-26 DIAGNOSIS — M25511 Pain in right shoulder: Secondary | ICD-10-CM | POA: Diagnosis present

## 2023-10-26 NOTE — Therapy (Signed)
 OUTPATIENT PHYSICAL THERAPY SHOULDER TREATMENT   Patient Name: Robin Harrington MRN: [id number] DOB:01-16-1966, 58 y.o., female Today's Date: 10/26/2023  END OF SESSION:  PT End of Session - 10/26/23 1018     Visit Number 19    Number of Visits 23    Date for PT Re-Evaluation 11/20/23    PT Start Time 1018    PT Stop Time 1109    PT Time Calculation (min) 51 min             Past Medical History:  Diagnosis Date   Acne    Allergy    Hyperlipidemia    History reviewed. No pertinent surgical history. 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   Anorectal disorder 01/11/2021   Anal spasm 01/11/2021   Patellofemoral arthritis 04/13/2019   Acute cystitis without hematuria 12/10/2018   Cervical pain 04/17/2017   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   Mixed hyperlipidemia 09/07/2016   Body mass index 30.0-30.9, adult 09/07/2016   Acne 09/07/2016   REFERRING PROVIDER: Dozier Soulier, MD  REFERRING DIAG: s/p Right Shoulder Scope  THERAPY DIAG:  Stiffness of right shoulder, not elsewhere classified  Acute pain of right shoulder  Muscle weakness (generalized)  Rationale for Evaluation and Treatment: Rehabilitation  ONSET DATE: 07/21/23  SUBJECTIVE:                                                                                                                                                                                      SUBJECTIVE STATEMENT: RT shldr pain today 1-2/10 and doing good. Did great last week   Hand dominance:  Right  PERTINENT HISTORY: Arthritis and allergies  PAIN:  Are you having pain? Yes: NPRS scale: 2/10 Pain location: right shoulder Pain description: intermittent ache Aggravating factors: any arm movement Relieving factors: medication  PRECAUTIONS: Shoulder  RED FLAGS: None   WEIGHT BEARING RESTRICTIONS: Yes see protocol  FALLS:  Has patient fallen in last 6 months? 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  PATIENT SURVEYS:  FOTO 19.06  COGNITION: Overall cognitive status: Within functional limits for tasks assessed     SENSATION: Patient reports intermittent numbness and tingling   POSTURE: Forward head and rounded shoulders with RUE in sling   UPPER EXTREMITY ROM:   Active ROM Right eval (PROM) Left eval  Shoulder flexion 35 157  Shoulder extension    Shoulder abduction 30 154  Shoulder adduction    Shoulder internal rotation To abdomen To T7  Shoulder external rotation 5 To T4  Elbow flexion    Elbow extension    Wrist flexion    Wrist extension    Wrist ulnar deviation    Wrist radial deviation    Wrist pronation    Wrist supination    (Blank rows = not tested)  UPPER EXTREMITY MMT: Not tested due to surgical condition  SHOULDER SPECIAL TESTS: Not tested due to surgical condition  JOINT MOBILITY TESTING:  Unable to assess due to pain   PALPATION:  No tenderness to palpation reported   TODAY'S TREATMENT:                                                                                                                                         DATE:                                                             10/26/23                                          EXERCISE LOG    RT shldr SX 07-21-23                   14 weeks  10-26-22  Exercise Repetitions and Resistance Comments  Pulleys  Forward and lateral   Standing UE ranger    UBE X 8 mins    60 RPMs   H-ABD     Rows 3# 3x10   Ext 2# 3x10   IR Lt side lying 1#   3x10   ER Yellow 2x10   AROM flexion/ABD     Supine CW/CCW    Bicep curl 3# 3x10   Tricep pushdown Blue 3x10   Flexion / ABD to 90 degrees 1# 3x10   OH press  1# 3x10    Blank cell = exercise not performed today   Manual:  AAROM and  PROM to RT shldr flexion and ABD        PATIENT EDUCATION: Education details:  See below Person educated: Patient,  Education method: Explanation, Demo,  Education comprehension: verbalized understanding, handout  HOME EXERCISE PROGRAM: WAND EXTERNAL ROTATION - SUPINE ER  Lie on your back holding a cane or wand with both hands.  On the affected side, place a small rolled up towel or pillow under your elbow. Maintain approx. 90 degree bend at the elbow with your arm approximately 30-45 degrees away from your side.  Use your other arm to pull the wand/cane to rotate the affected arm back into a stretch. Hold and then return to starting position and then repeat.   Video # [id number]  Repeat 10 Times Hold 10 Seconds Complete 2 Sets Perform 3 Times a Day   ASSESSMENT:  CLINICAL IMPRESSION:  FOTO Pt arrived today doing well  with RT shldr and was able to continue with RT shldr  Therex with resistive progressions. Mainly fatigue  end of session.   10/01/23 PROGRESS REPORT:  Patient is making good progress with skilled physical therapy within her surgical protocol as she has meet all of her initial short  and long term goals for therapy. Her goals were updated to reflect her progress and her need for continued physical therapy to address her right upper extremity strength and functional mobility deficits. She will be continue to be progressed, as  appropriate, to address her remaining impairments to return to her prior level of function. Recommend that she continue with her updated plan of care to address her remaining impairments to return to her prior level of function.    Lacinda Fass, PT, DPT   OBJECTIVE IMPAIRMENTS: decreased activity tolerance, decreased knowledge of condition, decreased mobility, decreased ROM, decreased strength, hypomobility, impaired sensation, impaired tone, impaired UE functional use, postural dysfunction, and pain.   ACTIVITY LIMITATIONS: carrying, lifting, sleeping, bathing, toileting, dressing, reach over head, and hygiene/grooming  PARTICIPATION LIMITATIONS: meal prep, cleaning, laundry, driving, shopping, community activity, occupation, and yard work  PERSONAL FACTORS: Behavior pattern and 1-2 comorbidities: Arthritis and allergies  are also affecting patient's functional outcome.   REHAB POTENTIAL: Good  CLINICAL DECISION MAKING: Stable/uncomplicated  EVALUATION COMPLEXITY: Low   GOALS: Goals reviewed with patient? Yes  SHORT TERM GOALS: Target date: 08/26/23  Patient will be independent with her initial HEP. Baseline: Goal status: MET  2.  Patient will improve her right shoulder passive range of motion to at least 90 degrees for improved shoulder mobility. Baseline:  Goal status: MET  3.  Patient will be able to demonstrate at least 20 degrees of passive right shoulder external rotation for improved shoulder mobility Baseline:  Goal status: MET  LONG TERM GOALS: Target date: 09/16/23  Patient will be independent with her advanced HEP. Baseline:  Goal status: On going  2.  Patient will be able to demonstrate at least 90 degrees of active right shoulder flexion for improved functional reaching. Baseline:  Goal status: MET  3.  Patient will report being able to get dressed with minimal to no difficulty. Baseline:  Goal status: MET  4.  Patient will be able to demonstrate at  least 90 degrees of active right shoulder abduction for improved function reaching. Baseline:  Goal status: MET  5.  Patient will be able to demonstrate functional right shoulder external rotation to at least T9 for improved function dressing. Baseline:  Goal status: INITIAL   6.  Patient will be able to lift at least 5 pounds overhead with her right upper extremity for improved function with household activities.  Baseline:  Goal status: INITIAL  7.  Patient will be able to safely return to pickleball without being limited by her right shoulder.  Baseline:  Goal status: INITIAL    PLAN:  PT FREQUENCY: 2-3x/week  PT DURATION: 4 weeks  PLANNED INTERVENTIONS: 97164- PT Re-evaluation, 97110-Therapeutic exercises, 97530- Therapeutic activity, 97112- Neuromuscular re-education, 97535- Self Care, [id number]- Manual therapy, 97014- Electrical stimulation (unattended), 97016- Vasopneumatic device, Patient/Family education, Taping, Joint mobilization, Cryotherapy, and Moist heat  PLAN FOR NEXT SESSION: Continue as per protocol. Start strengthening as per MD   Tacori Kvamme,CHRIS, PTA 10/26/2023, 11:16 AM

## 2023-10-27 ENCOUNTER — Encounter: Payer: BC Managed Care – PPO | Admitting: *Deleted

## 2023-10-29 ENCOUNTER — Encounter: Payer: BC Managed Care – PPO | Admitting: *Deleted

## 2023-10-30 ENCOUNTER — Ambulatory Visit: Payer: 59 | Admitting: *Deleted

## 2023-10-30 ENCOUNTER — Encounter: Payer: Self-pay | Admitting: Family Medicine

## 2023-10-30 ENCOUNTER — Telehealth: Payer: 59 | Admitting: Family Medicine

## 2023-10-30 ENCOUNTER — Encounter: Payer: Self-pay | Admitting: *Deleted

## 2023-10-30 DIAGNOSIS — M25511 Pain in right shoulder: Secondary | ICD-10-CM

## 2023-10-30 DIAGNOSIS — R002 Palpitations: Secondary | ICD-10-CM

## 2023-10-30 DIAGNOSIS — M25611 Stiffness of right shoulder, not elsewhere classified: Secondary | ICD-10-CM | POA: Diagnosis not present

## 2023-10-30 DIAGNOSIS — K219 Gastro-esophageal reflux disease without esophagitis: Secondary | ICD-10-CM | POA: Diagnosis not present

## 2023-10-30 DIAGNOSIS — M6281 Muscle weakness (generalized): Secondary | ICD-10-CM

## 2023-10-30 NOTE — Progress Notes (Signed)
   Virtual Visit via video Note   Due to COVID-19 pandemic this visit was conducted virtually. This visit type was conducted due to national recommendations for restrictions regarding the COVID-19 Pandemic (e.g. social distancing, sheltering in place) in an effort to limit this patient's exposure and mitigate transmission in our community. All issues noted in this document were discussed and addressed.  A physical exam was not performed with this format.  I connected with  Robin Harrington  on 10/30/23 at 1224 by video and verified that I am speaking with the correct person using two identifiers. Robin Harrington is currently located in [place] and no one is currently with her during the visit. The provider, Annabella CHRISTELLA Search, FNP is located in their office at time of visit.  I discussed the limitations, risks, security and privacy concerns of performing an evaluation and management service by video  and the availability of in person appointments. I also discussed with the patient that there may be a patient responsible charge related to this service. The patient expressed understanding and agreed to proceed.  CC: GERD  History and Present Illness:  [name] is here for follow up of GERD. Reports that GERD is well controlled with Protonix  daily. Reports palpitations and chest pain has resolved. Occasionally has heartburn if she overeats. Denies dysphagia, nausea, vomiting, abdominal pain.    ROS As per HPI.     Observations/Objective: Alert and oriented. Respirations unlabored. No cyanosis. Non toxic appearing. Normal mood and behavior.     Assessment and Plan: Tamora was seen today for gastroesophageal reflux.  Diagnoses and all orders for this visit:  Gastroesophageal reflux disease without esophagitis Well controlled on current regimen.   Palpitations Resolved with treatment of GERD.     Follow Up Instructions: Return in about 6 months (around 04/28/2024) for chronic follow up.      I discussed the assessment and treatment plan with the patient. The patient was provided an opportunity to ask questions and all were answered. The patient agreed with the plan and demonstrated an understanding of the instructions.   The patient was advised to call back or seek an in-person evaluation if the symptoms worsen or if the condition fails to improve as anticipated.  The above assessment and management plan was discussed with the patient. The patient verbalized understanding of and has agreed to the management plan. Patient is aware to call the clinic if symptoms persist or worsen. Patient is aware when to return to the clinic for a follow-up visit. Patient educated on when it is appropriate to go to the emergency department.   Time call ended: 1230  I provided 6 minutes of face-to-face time during this encounter.    Annabella CHRISTELLA Search, FNP

## 2023-10-30 NOTE — Therapy (Signed)
 OUTPATIENT PHYSICAL THERAPY SHOULDER TREATMENT   Patient Name: Robin Harrington MRN: [id number] DOB:1966-09-15, 58 y.o., female Today's Date: 10/30/2023  END OF SESSION:  PT End of Session - 10/30/23 1200     Visit Number 20    Number of Visits 23    Date for PT Re-Evaluation 11/20/23    PT Start Time 1145    PT Stop Time 1220    PT Time Calculation (min) 35 min             Past Medical History:  Diagnosis Date   Acne    Allergy    Hyperlipidemia    History reviewed. No pertinent surgical history. 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   Anorectal disorder 01/11/2021   Anal spasm 01/11/2021   Patellofemoral arthritis 04/13/2019   Acute cystitis without hematuria 12/10/2018   Cervical pain 04/17/2017   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   Mixed hyperlipidemia 09/07/2016   Body mass index 30.0-30.9, adult 09/07/2016   Acne 09/07/2016   REFERRING PROVIDER: Dozier Soulier, MD  REFERRING DIAG: s/p Right Shoulder Scope  THERAPY DIAG:  Stiffness of right shoulder, not elsewhere classified  Acute pain of right shoulder  Muscle weakness (generalized)  Rationale for Evaluation and Treatment: Rehabilitation  ONSET DATE: 07/21/23  SUBJECTIVE:                                                                                                                                                                                      SUBJECTIVE STATEMENT: RT shldr pain today 1-2/10 and doing good still. Getting my hand behind my back and head  are the hardest    Hand dominance: Right  PERTINENT HISTORY: Arthritis and allergies  PAIN:  Are you having pain? Yes: NPRS scale: 2/10 Pain location: right shoulder Pain description: intermittent ache Aggravating factors: any arm movement Relieving factors: medication  PRECAUTIONS: Shoulder  RED FLAGS: None   WEIGHT BEARING RESTRICTIONS: Yes see protocol  FALLS:  Has patient fallen in last 6 months? 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  PATIENT SURVEYS:  FOTO 19.06  COGNITION: Overall cognitive status: Within functional limits for tasks assessed     SENSATION: Patient reports intermittent numbness and tingling   POSTURE: Forward head and rounded shoulders with RUE in sling   UPPER EXTREMITY ROM:   Active ROM Right eval (PROM) Left eval  Shoulder flexion 35 157  Shoulder extension    Shoulder abduction 30 154  Shoulder adduction    Shoulder internal rotation To abdomen To T7  Shoulder external rotation 5 To T4  Elbow flexion    Elbow extension    Wrist flexion    Wrist extension    Wrist ulnar deviation    Wrist radial deviation    Wrist pronation    Wrist supination    (Blank rows = not tested)  UPPER EXTREMITY MMT: Not tested due to surgical condition  SHOULDER SPECIAL TESTS: Not tested due to surgical condition  JOINT MOBILITY TESTING:  Unable to assess due to pain   PALPATION:  No tenderness to palpation reported   TODAY'S TREATMENT:                                                                                                                                         DATE:                                                              10/30/23                                         EXERCISE LOG    RT shldr SX 07-21-23                   14 weeks  10-26-22  Exercise Repetitions and Resistance Comments  Pulleys  Forward and lateral   Standing UE ranger    UBE X 8 mins    60 RPMs   H-ABD     Rows 3#         3x10   Ext 2#       3x10   ER Lt side lying 1#          ER    AROM flexion/ABD     Supine CW/CCW    Bicep curl 4# 3x10   Tricep pushdown    Flexion / ABD to 90 degrees 1# 3x10   OH press  2# 3x10    Blank cell = exercise not performed today  Manual:         PATIENT EDUCATION: Education details: See below Person educated: Patient,  Education method: Explanation, Demo,  Education comprehension: verbalized understanding, handout  HOME EXERCISE PROGRAM: WAND EXTERNAL ROTATION - SUPINE ER  Lie on your back holding a cane or wand with both hands.  On the affected side, place a small rolled up towel or pillow under your elbow. Maintain approx. 90 degree bend at the elbow with your arm approximately 30-45 degrees away from your side.  Use your other arm to pull the wand/cane to rotate the affected arm back into a stretch. Hold and then return to starting position and then repeat.   Video # [id number]  Repeat 10 Times Hold 10 Seconds Complete 2 Sets Perform 3 Times a Day   ASSESSMENT:  CLINICAL IMPRESSION:   Pt arrived today doing well  with RT shldr and was able to continue with RT shldr  Therex, but had to leave early due to virtual MD visit. Mainly fatigue  end of session.      OBJECTIVE IMPAIRMENTS: decreased activity tolerance, decreased knowledge of condition, decreased mobility, decreased ROM, decreased strength, hypomobility, impaired sensation, impaired tone, impaired UE functional use, postural dysfunction, and pain.   ACTIVITY LIMITATIONS: carrying, lifting, sleeping, bathing, toileting, dressing, reach over head, and hygiene/grooming  PARTICIPATION LIMITATIONS:  meal prep, cleaning, laundry, driving, shopping, community activity, occupation, and yard work  PERSONAL FACTORS: Behavior pattern and 1-2 comorbidities: Arthritis and allergies  are also affecting patient's functional outcome.   REHAB POTENTIAL: Good  CLINICAL DECISION MAKING: Stable/uncomplicated  EVALUATION COMPLEXITY: Low   GOALS: Goals reviewed with patient? Yes  SHORT TERM GOALS: Target date: 08/26/23  Patient will be independent with her initial HEP. Baseline: Goal status: MET  2.  Patient will improve her right shoulder passive range of motion to at least 90 degrees for improved shoulder mobility. Baseline:  Goal status: MET  3.  Patient will be able to demonstrate at least 20 degrees of passive right shoulder external rotation for improved shoulder mobility Baseline:  Goal status: MET  LONG TERM GOALS: Target date: 09/16/23  Patient will be independent with her advanced HEP. Baseline:  Goal status: On going  2.  Patient will be able to demonstrate at least 90 degrees of active right shoulder flexion for improved functional reaching. Baseline:  Goal status: MET  3.  Patient will report being able to get dressed with minimal to no difficulty. Baseline:  Goal status: MET  4.  Patient will be able to demonstrate at least 90 degrees of active right shoulder abduction for improved function reaching. Baseline:  Goal status: MET  5.  Patient will be able to demonstrate functional right shoulder external rotation to at least T9 for improved function dressing. Baseline:  Goal status:  On going  6.  Patient will be able to lift at least 5 pounds overhead with her right upper extremity for improved function with household activities.  Baseline:  Goal status: On going  7.  Patient will be able to safely return to pickleball without being limited by her right shoulder.  Baseline:  Goal status:   On going  PLAN:  PT FREQUENCY: 2-3x/week  PT DURATION: 4  weeks  PLANNED INTERVENTIONS: 97164- PT Re-evaluation, 97110-Therapeutic exercises, 97530- Therapeutic activity, 97112- Neuromuscular re-education, 97535- Self Care, 02859- Manual therapy, 97014- Electrical stimulation (unattended), 97016- Vasopneumatic device, Patient/Family education, Taping, Joint mobilization, Cryotherapy, and Moist heat  PLAN FOR NEXT SESSION: Continue as per protocol. Start  strengthening as per MD   Mate Alegria,CHRIS, PTA 10/30/2023, 12:41 PM

## 2023-11-03 ENCOUNTER — Encounter: Payer: Self-pay | Admitting: *Deleted

## 2023-11-03 ENCOUNTER — Ambulatory Visit: Payer: 59 | Admitting: *Deleted

## 2023-11-03 DIAGNOSIS — M6281 Muscle weakness (generalized): Secondary | ICD-10-CM

## 2023-11-03 DIAGNOSIS — M25511 Pain in right shoulder: Secondary | ICD-10-CM

## 2023-11-03 DIAGNOSIS — M25611 Stiffness of right shoulder, not elsewhere classified: Secondary | ICD-10-CM

## 2023-11-03 NOTE — Therapy (Signed)
 OUTPATIENT PHYSICAL THERAPY SHOULDER TREATMENT   Patient Name: Robin Harrington MRN: [id number] DOB:1965/11/25, 58 y.o., female Today's Date: 11/03/2023  END OF SESSION:  PT End of Session - 11/03/23 1709     Visit Number 21    Number of Visits 23    Date for PT Re-Evaluation 11/20/23    PT Start Time 1640    PT Stop Time 1730    PT Time Calculation (min) 50 min             Past Medical History:  Diagnosis Date   Acne    Allergy    Hyperlipidemia    History reviewed. No pertinent surgical history. 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   Anorectal disorder 01/11/2021   Anal spasm 01/11/2021   Patellofemoral arthritis 04/13/2019   Acute cystitis without hematuria 12/10/2018   Cervical pain 04/17/2017   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   Mixed hyperlipidemia 09/07/2016   Body mass index 30.0-30.9, adult 09/07/2016   Acne 09/07/2016   REFERRING PROVIDER: Dozier Soulier, MD  REFERRING DIAG: s/p Right Shoulder Scope  THERAPY DIAG:  Stiffness of right shoulder, not elsewhere classified  Acute pain of right shoulder  Muscle weakness (generalized)  Rationale for Evaluation and Treatment: Rehabilitation  ONSET DATE: 07/21/23  SUBJECTIVE:                                                                                                                                                                                      SUBJECTIVE STATEMENT: RT shldr pain today 1-2/10 and doing good still. Getting my hand behind my back and head  are the hardest    Hand dominance: Right  PERTINENT HISTORY: Arthritis and allergies  PAIN:  Are you having pain? Yes: NPRS scale: 2/10 Pain location: right shoulder Pain description: intermittent ache Aggravating factors: any arm movement Relieving factors: medication  PRECAUTIONS: Shoulder  RED FLAGS: None   WEIGHT BEARING RESTRICTIONS: Yes see protocol  FALLS:  Has patient fallen in last 6 months? 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  PATIENT SURVEYS:  FOTO 19.06  COGNITION: Overall cognitive status: Within functional limits for tasks assessed     SENSATION: Patient reports intermittent numbness and tingling   POSTURE: Forward head and rounded shoulders with RUE in sling   UPPER EXTREMITY ROM:   Active ROM Right eval (PROM) Left eval  Shoulder flexion 35 157  Shoulder extension    Shoulder abduction 30 154  Shoulder adduction    Shoulder internal rotation To abdomen To T7  Shoulder external rotation 5 To T4  Elbow flexion    Elbow extension    Wrist flexion    Wrist extension    Wrist ulnar deviation    Wrist radial deviation    Wrist pronation    Wrist supination    (Blank rows = not tested)  UPPER EXTREMITY MMT: Not tested due to surgical condition  SHOULDER SPECIAL TESTS: Not tested due to surgical condition  JOINT MOBILITY TESTING:  Unable to assess due to pain   PALPATION:  No tenderness to palpation reported   TODAY'S TREATMENT:                                                                                                                                         DATE:                                                              11/03/23                                         EXERCISE LOG    RT shldr SX 07-21-23                   14 weeks  10-26-22  Exercise Repetitions and Resistance Comments  Pulleys  Forward and lateral   Standing UE ranger    UBE X 8 mins    60 RPMs   H-ABD     Rows 5#         3x10   Ext 2#       3x10   ER Lt side lying 1#    3x10`      ER    Supine ER stretch @ 90 degrees 1# wt   Supine CW/CCW    Bicep curl 5# 3x10   Tricep pushdown    Flexion / ABD to 90 degrees 2# 3x10   OH press  2# 3x10   Door way stretch X  5 hold 30 secs    Blank cell = exercise not performed today   Manual: PROM ER at 90 degrees abd        PATIENT EDUCATION: Education details: See below Person educated: Patient,  Education method: Explanation, Demo,  Education comprehension: verbalized understanding, handout  HOME EXERCISE PROGRAM: WAND EXTERNAL ROTATION - SUPINE ER  Lie on your back holding a cane or wand with both hands.  On the affected side, place a small rolled up towel or pillow under your elbow. Maintain approx. 90 degree bend at the elbow with your arm approximately 30-45 degrees away from your side.  Use your other arm to pull the wand/cane to rotate the affected arm back into a stretch. Hold and then return to starting position and then repeat.   Video # [id number]  Repeat 10 Times Hold 10 Seconds Complete 2 Sets Perform 3 Times a Day   ASSESSMENT:  CLINICAL IMPRESSION:   Pt arrived today doing better  with RT shldr. She was able to progress PRE's today in some of the exs and did well with mainly fatigue.  PROM focused on ER at 90 degrees abd.  Mainly fatigue  end of session. Start 1 x wk until MD f/u      OBJECTIVE IMPAIRMENTS: decreased activity tolerance, decreased knowledge of condition, decreased mobility, decreased ROM, decreased strength, hypomobility, impaired sensation, impaired tone, impaired UE functional use, postural dysfunction, and pain.    ACTIVITY LIMITATIONS: carrying, lifting, sleeping, bathing, toileting, dressing, reach over head, and hygiene/grooming  PARTICIPATION LIMITATIONS: meal prep, cleaning, laundry, driving, shopping, community activity, occupation, and yard work  PERSONAL FACTORS: Behavior pattern and 1-2 comorbidities: Arthritis and allergies  are also affecting patient's functional outcome.   REHAB POTENTIAL: Good  CLINICAL DECISION MAKING: Stable/uncomplicated  EVALUATION COMPLEXITY: Low   GOALS: Goals reviewed with patient? Yes  SHORT TERM GOALS: Target date: 08/26/23  Patient will be independent with her initial HEP. Baseline: Goal status: MET  2.  Patient will improve her right shoulder passive range of motion to at least 90 degrees for improved shoulder mobility. Baseline:  Goal status: MET  3.  Patient will be able to demonstrate at least 20 degrees of passive right shoulder external rotation for improved shoulder mobility Baseline:  Goal status: MET  LONG TERM GOALS: Target date: 09/16/23  Patient will be independent with her advanced HEP. Baseline:  Goal status: On going  2.  Patient will be able to demonstrate at least 90 degrees of active right shoulder flexion for improved functional reaching. Baseline:  Goal status: MET  3.  Patient will report being able to get dressed with minimal to no difficulty. Baseline:  Goal status: MET  4.  Patient will be able to demonstrate at least 90 degrees of active right shoulder abduction for improved function reaching. Baseline:  Goal status: MET  5.  Patient will be able to demonstrate functional right shoulder external rotation to at least T9 for improved function dressing. Baseline:  Goal status:  On going  6.  Patient will be able to lift at least 5 pounds overhead with her right upper extremity for improved function with household activities.  Baseline:  Goal status: On going  7.  Patient will be able to safely return to  pickleball without being limited by her right shoulder.  Baseline:  Goal status:   On going  PLAN:  PT FREQUENCY: 2-3x/week  PT DURATION: 4 weeks  PLANNED INTERVENTIONS: 97164- PT Re-evaluation, 97110-Therapeutic exercises, 97530- Therapeutic  activity, [id number]- Neuromuscular re-education, 97535- Self Care, 02859- Manual therapy, 97014- Electrical stimulation (unattended), 97016- Vasopneumatic device, Patient/Family education, Taping, Joint mobilization, Cryotherapy, and Moist heat  PLAN FOR NEXT SESSION: Continue as per protocol. Start strengthening as per MD   Kaaren Nass,CHRIS, PTA 11/03/2023, 5:42 PM

## 2023-11-05 ENCOUNTER — Encounter: Payer: Self-pay | Admitting: Physical Therapy

## 2023-11-06 NOTE — Progress Notes (Unsigned)
    [name] D.[facility] [address] [place] [address] Phone: [phone]   Assessment and Plan:     There are no diagnoses linked to this encounter.  ***   Pertinent previous records reviewed include ***    Follow Up: ***     Subjective:   I, Robin Harrington, am serving as a Neurosurgeon for Doctor Robin Harrington  Chief Complaint: right knee pain   HPI:  11/09/2023 Patient is a 58 year old female with right knee pain. Patient states   Relevant Historical Information: ***  Additional pertinent review of systems negative.   Current Outpatient Medications:    Doxycycline, Rosacea, (ORACEA) 40 MG CPDR, Take 40 mg by mouth daily., Disp: 90 capsule, Rfl: 1   fluticasone (FLONASE) 50 MCG/ACT nasal spray, Place 2 sprays into both nostrils daily., Disp: 16 g, Rfl: 1   gabapentin (NEURONTIN) 100 MG capsule, Take 2 capsules (200 mg total) by mouth at bedtime., Disp: 180 capsule, Rfl: 0   meloxicam (MOBIC) 15 MG tablet, Take 1 tablet (15 mg total) by mouth daily., Disp: 90 tablet, Rfl: 1   omeprazole (PRILOSEC) 40 MG capsule, Take 1 capsule (40 mg total) by mouth daily., Disp: 90 capsule, Rfl: 3   rosuvastatin (CRESTOR) 40 MG tablet, Take 1 tablet (40 mg total) by mouth daily., Disp: 90 tablet, Rfl: 3   valACYclovir (VALTREX) 1000 MG tablet, Take 1 tablet (1,000 mg total) by mouth daily., Disp: 30 tablet, Rfl: 11   zolpidem (AMBIEN) 5 MG tablet, Take 1 tablet (5 mg total) by mouth at bedtime as needed. for sleep, Disp: 15 tablet, Rfl: 0   Objective:     There were no vitals filed for this visit.    There is no height or weight on file to calculate BMI.    Physical Exam:    ***   Electronically signed by:  [name] D.[facility] 7:24 AM 11/06/23

## 2023-11-09 ENCOUNTER — Ambulatory Visit: Payer: 59 | Admitting: Sports Medicine

## 2023-11-09 VITALS — BP 120/80 | HR 54 | Ht 65.0 in | Wt 171.0 lb

## 2023-11-09 DIAGNOSIS — M1711 Unilateral primary osteoarthritis, right knee: Secondary | ICD-10-CM

## 2023-11-09 DIAGNOSIS — M25561 Pain in right knee: Secondary | ICD-10-CM

## 2023-11-09 DIAGNOSIS — G8929 Other chronic pain: Secondary | ICD-10-CM | POA: Diagnosis not present

## 2023-11-09 NOTE — Patient Instructions (Signed)
Knee HEP  As needed follow up

## 2023-11-12 ENCOUNTER — Ambulatory Visit: Payer: 59 | Admitting: *Deleted

## 2023-11-12 DIAGNOSIS — M25511 Pain in right shoulder: Secondary | ICD-10-CM

## 2023-11-12 DIAGNOSIS — M25611 Stiffness of right shoulder, not elsewhere classified: Secondary | ICD-10-CM | POA: Diagnosis not present

## 2023-11-12 DIAGNOSIS — M6281 Muscle weakness (generalized): Secondary | ICD-10-CM

## 2023-11-12 NOTE — Therapy (Addendum)
OUTPATIENT PHYSICAL THERAPY SHOULDER TREATMENT   Patient Name: Robin Harrington MRN: [id number] DOB:[birth year], 58 y.o., female Today's Date: 11/12/2023  END OF SESSION:  PT End of Session - 11/12/23 1641     Visit Number 22    Number of Visits 23    Date for PT Re-Evaluation 11/20/23    PT Start Time 1630    PT Stop Time 1720    PT Time Calculation (min) 50 min             Past Medical History:  Diagnosis Date   Acne    Allergy    Hyperlipidemia    No past surgical history on file. 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   Anorectal disorder 01/11/2021   Anal spasm 01/11/2021   Patellofemoral arthritis 04/13/2019   Acute cystitis without hematuria 12/10/2018   Cervical pain 04/17/2017   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   Mixed hyperlipidemia 09/07/2016   Body mass index 30.0-30.9, adult 09/07/2016   Acne 09/07/2016   REFERRING PROVIDER: Jones Broom, MD  REFERRING DIAG: s/p Right Shoulder Scope  THERAPY DIAG:  Stiffness of right shoulder, not elsewhere classified  Acute pain of right shoulder  Muscle weakness (generalized)  Rationale for Evaluation and Treatment: Rehabilitation  ONSET DATE: 07/21/23  SUBJECTIVE:                                                                                                                                                                                      SUBJECTIVE STATEMENT: RT shldr pain today 1-2/10 and doing good still.MD said to continue 2-3xweek for 4- 6 weeks Hand  dominance: Right  PERTINENT HISTORY: Arthritis and allergies  PAIN:  Are you having pain? Yes: NPRS scale: 2/10 Pain location: right shoulder Pain description: intermittent ache Aggravating factors: any arm movement Relieving factors: medication  PRECAUTIONS: Shoulder  RED FLAGS: None   WEIGHT BEARING RESTRICTIONS: Yes see protocol  FALLS:  Has patient fallen in last 6 months? 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  PATIENT SURVEYS:  FOTO 19.06  COGNITION: Overall cognitive status: Within functional limits for tasks assessed     SENSATION: Patient reports intermittent numbness and tingling   POSTURE: Forward head and rounded shoulders with RUE in sling   UPPER EXTREMITY ROM:   Active ROM Right eval (PROM) Left eval  Shoulder flexion 35 157  Shoulder extension    Shoulder abduction 30 154  Shoulder adduction    Shoulder internal rotation To abdomen To T7  Shoulder external rotation 5 To T4  Elbow flexion    Elbow extension    Wrist flexion    Wrist extension    Wrist ulnar deviation    Wrist radial deviation    Wrist pronation    Wrist supination    (Blank rows = not tested)  UPPER EXTREMITY MMT: Not tested due to surgical condition  SHOULDER SPECIAL TESTS: Not tested due to surgical condition  JOINT MOBILITY TESTING:  Unable to assess due to pain   PALPATION:  No tenderness to palpation reported   TODAY'S TREATMENT:                                                                                                                                         DATE:                                                              11/12/23                                         EXERCISE LOG    RT shldr SX 07-21-23                   16 weeks  11-09-22  Exercise Repetitions and Resistance Comments  Pulleys  Forward and lateral   Wall push ups 3x10   UBE X 8 mins    60 RPMs   H-ABD     Rows 5#         3x10   Ext 4#       3x10   ER Lt side lying 2#    3x10`      ER    Supine ER stretch @ 90 degrees    Supine CW/CCW    Bicep curl 5# 3x10   Tricep pushdown    Flexion / ABD to 90 degrees 4# 3x10   OH press  5# 3x10   Door way stretch X 5 hold 30 secs  Blank cell = exercise not performed today   Manual: PROM ER at 90 degrees abd        PATIENT EDUCATION: Education details: See below Person educated: Patient,  Education method: Explanation, Demo,  Education comprehension: verbalized understanding, handout  HOME EXERCISE PROGRAM: WAND EXTERNAL ROTATION - SUPINE ER  Lie on your back holding a cane or wand with both hands.  On the affected side, place a small rolled up towel or pillow under your elbow. Maintain approx. 90 degree bend at the elbow with your arm approximately 30-45 degrees away from your side.  Use your other arm to pull the wand/cane to rotate the affected arm back into a stretch. Hold and then return to starting position and then repeat.   Video # [id number]  Repeat 10 Times Hold 10 Seconds Complete 2 Sets Perform 3 Times a Day   ASSESSMENT:  CLINICAL IMPRESSION:   Pt arrived today doing better  with RT shldr ER. She reports MD was pleased and wants her to cont. PT x 6 weeks. She was able to progress PRE's again today with some of the exs and did well with mainly fatigue.  PROM focused on ER at 90 degrees abd.     11/12/23 PROGRESS REPORT:  Patient is making excellent progress with skilled physical therapy as evidenced by her objective measures, functional mobility, and progress toward her goals. She was able to meet most of her goals for skilled physical therapy at this time.  However, she has yet to meet her goal for improved functional internal rotation and returning to functional activities such as pickleball without right shoulder limitation. Recommend that she continue with skilled physical therapy for six additional visits to address her remaining impairments to return to her prior level of function.   [name], PT, DPT     OBJECTIVE IMPAIRMENTS: decreased activity tolerance, decreased knowledge of condition, decreased mobility, decreased ROM, decreased strength, hypomobility, impaired sensation, impaired tone, impaired UE functional use, postural dysfunction, and pain.   ACTIVITY LIMITATIONS: carrying, lifting, sleeping, bathing, toileting, dressing, reach over head, and hygiene/grooming  PARTICIPATION LIMITATIONS: meal prep, cleaning, laundry, driving, shopping, community activity, occupation, and yard work  PERSONAL FACTORS: Behavior pattern and 1-2 comorbidities: Arthritis and allergies  are also affecting patient's functional outcome.   REHAB POTENTIAL: Good  CLINICAL DECISION MAKING: Stable/uncomplicated  EVALUATION COMPLEXITY: Low   GOALS: Goals reviewed with patient? Yes  SHORT TERM GOALS: Target date: 08/26/23  Patient will be independent with her initial HEP. Baseline: Goal status: MET  2.  Patient will improve her right shoulder passive range of motion to at least 90 degrees for improved shoulder mobility. Baseline:  Goal status: MET  3.  Patient will be able to demonstrate at least 20 degrees of passive right shoulder external rotation for improved shoulder mobility Baseline:  Goal status: MET  LONG TERM GOALS: Target date: 09/16/23  Patient will be independent with her advanced HEP. Baseline:  Goal status: On going  2.  Patient will be able to demonstrate at least 90 degrees of active right shoulder flexion for improved functional reaching. Baseline:  Goal status: MET  3.  Patient will report being able to get dressed  with minimal to no difficulty. Baseline:  Goal status: MET  4.  Patient will be able to demonstrate at least 90 degrees of active right shoulder abduction for improved function reaching. Baseline:  Goal status: MET  5.  Patient will be able to demonstrate functional right shoulder external rotation  to at least T9 for improved function dressing. Baseline:  Goal status:  On going  6.  Patient will be able to lift at least 5 pounds overhead with her right upper extremity for improved function with household activities.  Baseline:  Goal status:Met  7.  Patient will be able to safely return to pickleball without being limited by her right shoulder.  Baseline:  Goal status:   On going  PLAN:  PT FREQUENCY: 2-3x/week  PT DURATION: 4 weeks  PLANNED INTERVENTIONS: 97164- PT Re-evaluation, 97110-Therapeutic exercises, 97530- Therapeutic activity, 97112- Neuromuscular re-education, 97535- Self Care, 09811- Manual therapy, 97014- Electrical stimulation (unattended), 97016- Vasopneumatic device, Patient/Family education, Taping, Joint mobilization, Cryotherapy, and Moist heat  PLAN FOR NEXT SESSION: Continue as per protocol. Start strengthening as per MD   Alithea Lapage,CHRIS, PTA 11/12/2023, 5:36 PM

## 2023-11-13 NOTE — Addendum Note (Signed)
Addended by: Granville Lewis on: 11/13/2023 08:29 AM   Modules accepted: Orders

## 2023-11-19 ENCOUNTER — Encounter: Payer: Self-pay | Admitting: *Deleted

## 2023-11-19 ENCOUNTER — Ambulatory Visit: Payer: 59 | Admitting: *Deleted

## 2023-11-19 DIAGNOSIS — M25511 Pain in right shoulder: Secondary | ICD-10-CM

## 2023-11-19 DIAGNOSIS — M25611 Stiffness of right shoulder, not elsewhere classified: Secondary | ICD-10-CM

## 2023-11-19 DIAGNOSIS — M6281 Muscle weakness (generalized): Secondary | ICD-10-CM

## 2023-11-19 NOTE — Therapy (Signed)
OUTPATIENT PHYSICAL THERAPY SHOULDER TREATMENT   Patient Name: Robin Harrington MRN: [id number] DOB:August 18, 1966, 58 y.o., female Today's Date: 11/19/2023  END OF SESSION:  PT End of Session - 11/19/23 1648     Visit Number 23    Number of Visits 29    Date for PT Re-Evaluation 01/01/24    PT Start Time 1640    PT Stop Time 1728    PT Time Calculation (min) 48 min             Past Medical History:  Diagnosis Date   Acne    Allergy    Hyperlipidemia    History reviewed. No pertinent surgical history. 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   Anorectal disorder 01/11/2021   Anal spasm 01/11/2021   Patellofemoral arthritis 04/13/2019   Acute cystitis without hematuria 12/10/2018   Cervical pain 04/17/2017   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   Mixed hyperlipidemia 09/07/2016   Body mass index 30.0-30.9, adult 09/07/2016   Acne 09/07/2016   REFERRING PROVIDER: Jones Broom, MD  REFERRING DIAG: s/p Right Shoulder Scope  THERAPY DIAG:  Stiffness of right shoulder, not elsewhere classified  Acute pain of right shoulder  Muscle weakness (generalized)  Rationale for Evaluation and Treatment: Rehabilitation  ONSET DATE: 07/21/23  SUBJECTIVE:                                                                                                                                                                                      SUBJECTIVE STATEMENT: RT shldr pain today 1-2/10 and doing good still.  Doing good Hand dominance:  Right  PERTINENT HISTORY: Arthritis and allergies  PAIN:  Are you having pain? Yes: NPRS scale: 2/10 Pain location: right shoulder Pain description: intermittent ache Aggravating factors: any arm movement Relieving factors: medication  PRECAUTIONS: Shoulder  RED FLAGS: None   WEIGHT BEARING RESTRICTIONS: Yes see protocol  FALLS:  Has patient fallen in last 6 months? 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  PATIENT SURVEYS:  FOTO 19.06  COGNITION: Overall cognitive status: Within functional limits for tasks assessed     SENSATION: Patient reports intermittent numbness and tingling   POSTURE: Forward head and rounded shoulders with RUE in sling   UPPER EXTREMITY ROM:   Active ROM Right eval (PROM) Left eval  Shoulder flexion 35 157  Shoulder extension    Shoulder abduction 30 154  Shoulder adduction    Shoulder internal rotation To abdomen To T7  Shoulder external rotation 5 To T4  Elbow flexion    Elbow extension    Wrist flexion    Wrist extension    Wrist ulnar deviation    Wrist radial deviation    Wrist pronation    Wrist supination    (Blank rows = not tested)  UPPER EXTREMITY MMT: Not tested due to surgical condition  SHOULDER SPECIAL TESTS: Not tested due to surgical condition  JOINT MOBILITY TESTING:  Unable to assess due to pain   PALPATION:  No tenderness to palpation reported   TODAY'S TREATMENT:                                                                                                                                         DATE:                                                             11/19/23                                          EXERCISE LOG    RT shldr SX 07-21-23                   16 weeks  11-09-22  Exercise Repetitions and Resistance Comments  Pulleys  Forward and lateral   Wall push ups 3x10   UBE X 8 mins    60 RPMs   H-ABD     Rows 5#     3x10   Ext 4#     3x10   ER Lt side lying 2#     3x10`      ER in standing at 90 degrees of ABD   2#    3x fatigue   Supine ER stretch @ 90 degrees    Supine CW/CCW    Bicep curl 5#      3x10   Tricep pushdown    Flexion / ABD to 90 degrees 4#      3x10   OH press  5# 3x10  Door way stretch     Blank cell = exercise not performed today   Manual: PROM ER at 90 degrees abd        PATIENT EDUCATION: Education details: See below Person educated: Patient,  Education method: Explanation, Demo,  Education comprehension: verbalized understanding, handout  HOME EXERCISE PROGRAM: WAND EXTERNAL ROTATION - SUPINE ER  Lie on your back holding a cane or wand with both hands.  On the affected side, place a small rolled up towel or pillow under your elbow. Maintain approx. 90 degree bend at the elbow with your arm approximately 30-45 degrees away from your side.  Use your other arm to pull the wand/cane to rotate the affected arm back into a stretch. Hold and then return to starting position and then repeat.   Video # [id number]  Repeat 10 Times Hold 10 Seconds Complete 2 Sets Perform 3 Times a Day   ASSESSMENT:  CLINICAL IMPRESSION:   Pt arrived today doing fairly well with RT shldr and was able to continue with strengthening Exs with added ER at 90 degrees of ABD. ER 90 degrees at 90 of ABD.      OBJECTIVE IMPAIRMENTS: decreased activity tolerance, decreased knowledge of condition, decreased mobility, decreased ROM, decreased strength, hypomobility, impaired sensation, impaired tone, impaired UE functional use, postural dysfunction, and pain.   ACTIVITY LIMITATIONS: carrying, lifting, sleeping, bathing, toileting,  dressing, reach over head, and hygiene/grooming  PARTICIPATION LIMITATIONS: meal prep, cleaning, laundry, driving, shopping, community activity, occupation, and yard work  PERSONAL FACTORS: Behavior pattern and 1-2 comorbidities: Arthritis and allergies  are also affecting patient's functional outcome.   REHAB POTENTIAL: Good  CLINICAL DECISION MAKING: Stable/uncomplicated  EVALUATION COMPLEXITY: Low   GOALS: Goals reviewed with patient? Yes  SHORT TERM GOALS: Target date: 08/26/23  Patient will be independent with her initial HEP. Baseline: Goal status: MET  2.  Patient will improve her right shoulder passive range of motion to at least 90 degrees for improved shoulder mobility. Baseline:  Goal status: MET  3.  Patient will be able to demonstrate at least 20 degrees of passive right shoulder external rotation for improved shoulder mobility Baseline:  Goal status: MET  LONG TERM GOALS: Target date: 09/16/23  Patient will be independent with her advanced HEP. Baseline:  Goal status: On going  2.  Patient will be able to demonstrate at least 90 degrees of active right shoulder flexion for improved functional reaching. Baseline:  Goal status: MET  3.  Patient will report being able to get dressed with minimal to no difficulty. Baseline:  Goal status: MET  4.  Patient will be able to demonstrate at least 90 degrees of active right shoulder abduction for improved function reaching. Baseline:  Goal status: MET  5.  Patient will be able to demonstrate functional right shoulder external rotation to at least T9 for improved function dressing. Baseline:  Goal status:  On going  6.  Patient will be able to lift at least 5 pounds overhead with her right upper extremity for improved function with household activities.  Baseline:  Goal status:Met  7.  Patient will be able to safely return to pickleball without being limited by her right shoulder.  Baseline:  Goal status:    On going  PLAN:  PT FREQUENCY: 2-3x/week  PT DURATION: 4 weeks  PLANNED INTERVENTIONS: 97164- PT Re-evaluation, 97110-Therapeutic exercises, 97530- Therapeutic activity, 97112- Neuromuscular re-education, 97535- Self Care, 16109- Manual therapy, 97014- Electrical stimulation (unattended), 97016- Vasopneumatic device, Patient/Family education, Taping,  Joint mobilization, Cryotherapy, and Moist heat  PLAN FOR NEXT SESSION: Continue as per protocol. Start strengthening as per MD   Althea Backs,CHRIS, PTA 11/19/2023, 5:43 PM

## 2023-11-26 ENCOUNTER — Encounter: Payer: 59 | Admitting: Physical Therapy

## 2023-11-26 ENCOUNTER — Encounter: Payer: Self-pay | Admitting: Family Medicine

## 2023-11-30 NOTE — Progress Notes (Signed)
[facility] [address] [place] [address] Phone: [phone] Subjective:   Robin Harrington, am serving as a scribe for Dr. Antoine Primas.  I'm seeing this patient by the request  of:  [name], FNP  CC: Bilateral knee pain  ION:[id number]  Robin Harrington is a 58 y.o. female coming in with complaint of B knee pain. R>L.  Known patellofemoral arthritis.  Had been doing relatively well with injections.  Last steroid injection was in June 2023.Patient saw another provider in January of this year and was given viscosupplementation. Has been seen more for her shoulder recently patient states left shoulder. R knee posterior side hurts pretty bad. Injection didn't help. L knee also hurts but R is worse.      Past Medical History:  Diagnosis Date   Acne    Allergy    Hyperlipidemia    No past surgical history on file. Social History   Socioeconomic History   Marital status: Divorced    Spouse name: Not on file   Number of children: Not on file   Years of education: Not on file   Highest education level: Master's degree (e.g., MA, MS, MEng, MEd, MSW, MBA)  Occupational History   Not on file  Tobacco Use   Smoking status: Never   Smokeless tobacco: Never  Vaping Use   Vaping status: Never Used  Substance and Sexual Activity   Alcohol use: No   Drug use: No   Sexual activity: Not on file  Other Topics Concern   Not on file  Social History Narrative   Not on file   Social Drivers of Health   Financial Resource Strain: Low Risk  (06/01/2023)   Overall Financial Resource Strain (CARDIA)    Difficulty of Paying Living Expenses: Not very hard  Food Insecurity: No Food Insecurity (06/01/2023)   Hunger Vital Sign    Worried About Running Out of Food in the Last Year: Never true    Ran Out of Food in the Last Year: Never true  Transportation Needs: No Transportation Needs (06/01/2023)   PRAPARE - Scientist, research (physical sciences) (Medical): No    Lack of Transportation (Non-Medical): No  Physical Activity: Sufficiently Active (06/01/2023)   Exercise Vital Sign    Days of Exercise per Week: 4 days    Minutes of Exercise per Session: 90 min  Stress: No Stress Concern Present (06/01/2023)   [employer] of Occupational Health - Occupational Stress Questionnaire    Feeling of Stress : Only a little  Social Connections: Moderately Integrated (06/01/2023)   Social Connection and Isolation Panel [NHANES]    Frequency of Communication with Friends and Family: More than three times a week    Frequency of Social Gatherings with Friends and Family: More than three times a week    Attends Religious Services: More than 4 times per year    Active Member of [employer] or Organizations: Yes    Attends Engineer, structural: More than 4 times per year    Marital Status: Divorced   No Known Allergies Family History  Problem Relation Age of Onset   Hypertension Mother    Atrial fibrillation Mother    Cancer Mother        melanoma    Cancer Father        colon    Heart disease Father    Breast cancer Neg Hx      Current  Outpatient Medications (Cardiovascular):    rosuvastatin (CRESTOR) 40 MG tablet, Take 1 tablet (40 mg total) by mouth daily.  Current Outpatient Medications (Respiratory):    fluticasone (FLONASE) 50 MCG/ACT nasal spray, Place 2 sprays into both nostrils daily.  Current Outpatient Medications (Analgesics):    meloxicam (MOBIC) 15 MG tablet, Take 1 tablet (15 mg total) by mouth daily.   Current Outpatient Medications (Other):    Doxycycline, Rosacea, (ORACEA) 40 MG CPDR, Take 40 mg by mouth daily.   gabapentin (NEURONTIN) 100 MG capsule, Take 2 capsules (200 mg total) by mouth at bedtime.   omeprazole (PRILOSEC) 40 MG capsule, Take 1 capsule (40 mg total) by mouth daily.   valACYclovir (VALTREX) 1000 MG tablet, Take 1 tablet (1,000 mg total) by mouth daily.   zolpidem (AMBIEN) 5  MG tablet, Take 1 tablet (5 mg total) by mouth at bedtime as needed. for sleep   Reviewed prior external information including notes and imaging from  primary care provider As well as notes that were available from care everywhere and other healthcare systems.  Past medical history, social, surgical and family history all reviewed in electronic medical record.  No pertanent information unless stated regarding to the chief complaint.   Review of Systems:  No headache, visual changes, nausea, vomiting, diarrhea, constipation, dizziness, abdominal pain, skin rash, fevers, chills, night sweats, weight loss, swollen lymph nodes, body aches, joint swelling, chest pain, shortness of breath, mood changes. POSITIVE muscle aches  Objective  There were no vitals taken for this visit.   General: No apparent distress alert and oriented x3 mood and affect normal, dressed appropriately.  HEENT: Pupils equal, extraocular movements intact  Respiratory: Patient's speak in full sentences and does not appear short of breath  Cardiovascular: No lower extremity edema, non tender, no erythema  Knee exam shows crepitus noted.  Lateral tracking noted.  Limited muscular skeletal ultrasound was performed and interpreted by Antoine Primas, M  Limited ultrasound does show some trace effusion noted.  Fairly significant overall.  Does have some narrowing of the patellofemoral joint.   Impression and Recommendations:     The above documentation has been reviewed and is accurate and complete Judi Saa, DO

## 2023-12-01 ENCOUNTER — Encounter: Payer: Self-pay | Admitting: Family Medicine

## 2023-12-01 ENCOUNTER — Ambulatory Visit: Payer: 59 | Admitting: Family Medicine

## 2023-12-01 VITALS — BP 122/78 | HR 86 | Ht 65.0 in | Wt 164.0 lb

## 2023-12-01 DIAGNOSIS — M1710 Unilateral primary osteoarthritis, unspecified knee: Secondary | ICD-10-CM

## 2023-12-01 NOTE — Patient Instructions (Signed)
Do prescribed exercises at least 3x a week Heel Lifts We will get gel approval See you next week

## 2023-12-01 NOTE — Assessment & Plan Note (Signed)
Knee  arthritis noted, discuss possible visco I think would be beneficial.  Patient did respond to that in the past.  We can do PRP again with patient having 2 years of improvement with it.  Patient is in agreement with either of these but would like to save up for the PRP and we will see if she can be a candidate for viscosupplementation.  Total time reviewing patient's chart as well as discussing with patient 31 minutes

## 2023-12-02 ENCOUNTER — Ambulatory Visit: Payer: Self-pay | Attending: Orthopedic Surgery

## 2023-12-02 DIAGNOSIS — M6281 Muscle weakness (generalized): Secondary | ICD-10-CM | POA: Diagnosis present

## 2023-12-02 DIAGNOSIS — M25511 Pain in right shoulder: Secondary | ICD-10-CM | POA: Diagnosis present

## 2023-12-02 DIAGNOSIS — M25611 Stiffness of right shoulder, not elsewhere classified: Secondary | ICD-10-CM | POA: Insufficient documentation

## 2023-12-02 NOTE — Therapy (Signed)
OUTPATIENT PHYSICAL THERAPY SHOULDER TREATMENT   Patient Name: Robin Harrington MRN: [id number] DOB:[birth year], 58 y.o., female Today's Date: 12/02/2023  END OF SESSION:  PT End of Session - 12/02/23 0855     Visit Number 24    Number of Visits 29    Date for PT Re-Evaluation 01/01/24    PT Start Time 0842    PT Stop Time 0923    PT Time Calculation (min) 41 min             Past Medical History:  Diagnosis Date   Acne    Allergy    Hyperlipidemia    History reviewed. No pertinent surgical history. 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   Anorectal disorder 01/11/2021   Anal spasm 01/11/2021   Patellofemoral arthritis 04/13/2019   Acute cystitis without hematuria 12/10/2018   Cervical pain 04/17/2017   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   Mixed hyperlipidemia 09/07/2016   Body mass index 30.0-30.9, adult 09/07/2016   Acne 09/07/2016   REFERRING PROVIDER: Jones Broom, MD  REFERRING DIAG: s/p Right Shoulder Scope  THERAPY DIAG:  Stiffness of right shoulder, not elsewhere classified  Acute pain of right shoulder  Muscle weakness (generalized)  Rationale for Evaluation and Treatment: Rehabilitation  ONSET DATE: 07/21/23  SUBJECTIVE:                                                                                                                                                                                      SUBJECTIVE STATEMENT: Pt denies any pain today.  Pt states that she would like to start playing pickle ball  right handed a little bit at a time.   Hand dominance: Right  PERTINENT HISTORY: Arthritis and allergies  PAIN:  Are you having pain? No  PRECAUTIONS: Shoulder  RED FLAGS: None   WEIGHT BEARING RESTRICTIONS: Yes see protocol  FALLS:  Has patient fallen in last 6 months? 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  PATIENT SURVEYS:  FOTO 19.06  COGNITION: Overall cognitive status: Within functional limits for tasks assessed     SENSATION: Patient reports intermittent numbness and tingling   POSTURE: Forward head and rounded shoulders with RUE in sling   UPPER EXTREMITY ROM:   Active ROM Right eval (PROM) Left eval  Shoulder flexion 35 157  Shoulder extension    Shoulder abduction 30 154  Shoulder adduction    Shoulder internal rotation To abdomen To T7  Shoulder external rotation 5 To T4  Elbow flexion    Elbow extension    Wrist flexion    Wrist extension    Wrist ulnar deviation    Wrist radial deviation    Wrist pronation    Wrist supination    (Blank rows = not tested)  UPPER EXTREMITY MMT: Not tested due to surgical condition  SHOULDER SPECIAL TESTS: Not tested due to surgical condition  JOINT MOBILITY TESTING:  Unable to assess due to pain   PALPATION:  No tenderness to palpation reported   TODAY'S TREATMENT:                                                                                                                                         DATE:  12/02/23                                        EXERCISE LOG    RT shldr SX 07-21-23                   16 weeks  11-09-22  Exercise Repetitions and Resistance Comments   Pulleys  Forward and lateral   Simulated Over-hand Yellow x 15 reps   Wall push ups 3x10   UBE X 8 mins    60 RPMs   H-ABD     Rows 6# x 25 reps   Ext 5# x 25 reps   ER Lt side lying    ER in standing at 90 degrees of ABD   3#  3x fatigue   Supine ER stretch @ 90 degrees    Supine CW/CCW    Bicep curl 3-way 6# x 20 reps   Tricep pushdown    Flexion / ABD to 90 degrees 5# x 25 reps   OH press  6# x 25 reps   Door way stretch     Blank cell = exercise not performed today   PATIENT EDUCATION: Education details: See below Person educated: Patient,  Education method: Programmer, multimedia, Demo,  Education comprehension: verbalized understanding, handout  HOME EXERCISE PROGRAM: WAND EXTERNAL ROTATION - SUPINE ER  Lie on your back holding a cane or wand with both hands.  On the affected side, place a small rolled up towel  or pillow under your elbow. Maintain approx. 90 degree bend at the elbow with your arm approximately 30-45 degrees away from your side.  Use your other arm to pull the wand/cane to rotate the affected arm back into a stretch. Hold and then return to starting position and then repeat.   Video # [id number]  Repeat 10 Times Hold 10 Seconds Complete 2 Sets Perform 3 Times a Day   ASSESSMENT:  CLINICAL IMPRESSION:   Pt arrives for today's treatment session denying any pain.  Pt states that she would like to begin playing pickle ball right handed a little at a time to start weaning herself back into the process. Pt able to tolerate increased weight with all exercises performed today.  Pt introduced to simulated over head shooting with yellow t-band today.  Pt given yellow t-band for home use.  Pt denied any pain at completion of today's treatment session.   OBJECTIVE IMPAIRMENTS: decreased activity tolerance, decreased knowledge of condition, decreased mobility, decreased ROM, decreased strength, hypomobility, impaired sensation, impaired tone, impaired UE functional use,  postural dysfunction, and pain.   ACTIVITY LIMITATIONS: carrying, lifting, sleeping, bathing, toileting, dressing, reach over head, and hygiene/grooming  PARTICIPATION LIMITATIONS: meal prep, cleaning, laundry, driving, shopping, community activity, occupation, and yard work  PERSONAL FACTORS: Behavior pattern and 1-2 comorbidities: Arthritis and allergies  are also affecting patient's functional outcome.   REHAB POTENTIAL: Good  CLINICAL DECISION MAKING: Stable/uncomplicated  EVALUATION COMPLEXITY: Low   GOALS: Goals reviewed with patient? Yes  SHORT TERM GOALS: Target date: 08/26/23  Patient will be independent with her initial HEP. Baseline: Goal status: MET  2.  Patient will improve her right shoulder passive range of motion to at least 90 degrees for improved shoulder mobility. Baseline:  Goal status: MET  3.  Patient will be able to demonstrate at least 20 degrees of passive right shoulder external rotation for improved shoulder mobility Baseline:  Goal status: MET  LONG TERM GOALS: Target date: 09/16/23  Patient will be independent with her advanced HEP. Baseline:  Goal status: On going  2.  Patient will be able to demonstrate at least 90 degrees of active right shoulder flexion for improved functional reaching. Baseline:  Goal status: MET  3.  Patient will report being able to get dressed with minimal to no difficulty. Baseline:  Goal status: MET  4.  Patient will be able to demonstrate at least 90 degrees of active right shoulder abduction for improved function reaching. Baseline:  Goal status: MET  5.  Patient will be able to demonstrate functional right shoulder external rotation to at least T9 for improved function dressing. Baseline:  Goal status:  On going  6.  Patient will be able to lift at least 5 pounds overhead with her right upper extremity for improved function with household activities.  Baseline:  Goal status:Met  7.  Patient will be  able to safely return to pickleball without being limited by her right shoulder.  Baseline:  Goal status:   On going  PLAN:  PT FREQUENCY: 2-3x/week  PT DURATION: 4 weeks  PLANNED INTERVENTIONS: 97164- PT Re-evaluation, 97110-Therapeutic exercises, 97530- Therapeutic activity, 97112- Neuromuscular re-education, 97535- Self Care, 16109- Manual therapy, 97014- Electrical stimulation (unattended), 97016- Vasopneumatic device, Patient/Family education, Taping, Joint mobilization, Cryotherapy, and Moist heat  PLAN FOR NEXT SESSION: Continue as per protocol. Start strengthening as per MD   Newman Pies, PTA 12/02/2023, 9:30 AM

## 2023-12-03 ENCOUNTER — Telehealth: Payer: Self-pay

## 2023-12-03 ENCOUNTER — Encounter: Payer: 59 | Admitting: *Deleted

## 2023-12-03 NOTE — Telephone Encounter (Signed)
Val, okay to do this at her scheduled appointment on Thursday, 2/20?

## 2023-12-03 NOTE — Telephone Encounter (Signed)
Val patient is approved she can schedule wherever you have room.  [facility] this is for your medication orders   MONOVISC authorized for bilateral knee Deductible does not apply Once the OOP has been met patient is covered at 100% Only one copay per DOS PA approved [place] number [id number] 12/02/23-11/30/24

## 2023-12-08 ENCOUNTER — Encounter: Payer: 59 | Admitting: *Deleted

## 2023-12-09 ENCOUNTER — Encounter: Payer: Self-pay | Admitting: Family Medicine

## 2023-12-09 ENCOUNTER — Ambulatory Visit (INDEPENDENT_AMBULATORY_CARE_PROVIDER_SITE_OTHER): Payer: 59 | Admitting: Family Medicine

## 2023-12-09 VITALS — BP 108/80 | HR 62 | Ht 65.0 in | Wt 166.0 lb

## 2023-12-09 DIAGNOSIS — M1710 Unilateral primary osteoarthritis, unspecified knee: Secondary | ICD-10-CM

## 2023-12-09 MED ORDER — HYALURONAN 88 MG/4ML IX SOSY
176.0000 mg | PREFILLED_SYRINGE | Freq: Once | INTRA_ARTICULAR | Status: AC
  Administered 2023-12-09: 176 mg via INTRA_ARTICULAR

## 2023-12-09 NOTE — Progress Notes (Addendum)
 [name] [address] [place] [address] Phone: [phone] Subjective:   INadine Counts, am serving as a scribe for Dr. Antoine Primas.' I'm seeing this patient by the request  of:  [name], FNP  CC: Bilateral knee pain  [id number]  Robin Harrington is a 58 y.o. female coming in with complaint of bilateral knee pain. Here for knee injections. Burning pain on the medial side of knee. Feels like touching a sunburn.       Past Medical History:  Diagnosis Date   Acne    Allergy    Hyperlipidemia    No past surgical history on file. Social History   Socioeconomic History   Marital status: Divorced    Spouse name: Not on file   Number of children: Not on file   Years of education: Not on file   Highest education level: Master's degree (e.g., MA, MS, MEng, MEd, MSW, MBA)  Occupational History   Not on file  Tobacco Use   Smoking status: Never   Smokeless tobacco: Never  Vaping Use   Vaping status: Never Used  Substance and Sexual Activity   Alcohol use: No   Drug use: No   Sexual activity: Not on file  Other Topics Concern   Not on file  Social History Narrative   Not on file   Social Drivers of Health   Financial Resource Strain: Low Risk  (06/01/2023)   Overall Financial Resource Strain (CARDIA)    Difficulty of Paying Living Expenses: Not very hard  Food Insecurity: No Food Insecurity (06/01/2023)   Hunger Vital Sign    Worried About Running Out of Food in the Last Year: Never true    Ran Out of Food in the Last Year: Never true  Transportation Needs: No Transportation Needs (06/01/2023)   PRAPARE - Administrator, Civil Service (Medical): No    Lack of Transportation (Non-Medical): No  Physical Activity: Sufficiently Active (06/01/2023)   Exercise Vital Sign    Days of Exercise per Week: 4 days    Minutes of Exercise per Session: 90 min  Stress: No Stress Concern Present (06/01/2023)   [employer] of Occupational Health - Occupational Stress Questionnaire    Feeling of Stress : Only a little  Social Connections: Moderately Integrated (06/01/2023)   Social Connection and Isolation Panel [NHANES]    Frequency of Communication with Friends and Family: More than three times a week    Frequency of Social Gatherings with Friends and Family: More than three times a week    Attends Religious Services: More than 4 times per year    Active Member of [employer] or Organizations: Yes    Attends Engineer, structural: More than 4 times per year    Marital Status: Divorced   No Known Allergies Family History  Problem Relation Age of Onset   Hypertension Mother    Atrial fibrillation Mother    Cancer Mother        melanoma    Cancer Father        colon    Heart disease Father    Breast cancer Neg Hx      Current Outpatient Medications (Cardiovascular):    rosuvastatin (CRESTOR) 40 MG tablet, Take 1 tablet (40 mg total) by mouth daily.  Current Outpatient Medications (Respiratory):    fluticasone (FLONASE) 50 MCG/ACT nasal spray, Place 2 sprays into both nostrils daily.  Current Outpatient Medications (Analgesics):  meloxicam (MOBIC) 15 MG tablet, Take 1 tablet (15 mg total) by mouth daily.   Current Outpatient Medications (Other):    Doxycycline, Rosacea, (ORACEA) 40 MG CPDR, Take 40 mg by mouth daily.   gabapentin (NEURONTIN) 100 MG capsule, Take 2 capsules (200 mg total) by mouth at bedtime.   omeprazole (PRILOSEC) 40 MG capsule, Take 1 capsule (40 mg total) by mouth daily.   valACYclovir (VALTREX) 1000 MG tablet, Take 1 tablet (1,000 mg total) by mouth daily.   zolpidem (AMBIEN) 5 MG tablet, Take 1 tablet (5 mg total) by mouth at bedtime as needed. for sleep   Reviewed prior external information including notes and imaging from  primary care provider As well as notes that were available from care everywhere and other healthcare systems.  Past medical history,  social, surgical and family history all reviewed in electronic medical record.  No pertanent information unless stated regarding to the chief complaint.   Review of Systems:  No headache, visual changes, nausea, vomiting, diarrhea, constipation, dizziness, abdominal pain, skin rash, fevers, chills, night sweats, weight loss, swollen lymph nodes, body aches, joint swelling, chest pain, shortness of breath, mood changes. POSITIVE muscle aches  Objective  Blood pressure 108/80, pulse 62, height 5\' 5"  (1.651 m), weight 166 lb (75.3 kg), SpO2 98%.   General: No apparent distress alert and oriented x3 mood and affect normal, dressed appropriately.  HEENT: Pupils equal, extraocular movements intact  Respiratory: Patient's speak in full sentences and does not appear short of breath  Cardiovascular: No lower extremity edema, non tender, no erythema  Knee exam does have some crepitus noted.  Some lateral tracking of the patella noted.  Mild instability with valgus and varus force.  After informed written and verbal consent, patient was seated on exam table. Right knee was prepped with alcohol swab and utilizing anterolateral approach, patient's right knee space was injected with 48 mg per 3 mL of Monovisc (sodium hyaluronate) in a prefilled syringe was injected easily into the knee through a 22-gauge needle..Patient tolerated the procedure well without immediate complications.  After informed written and verbal consent, patient was seated on exam table. Left knee was prepped with alcohol swab and utilizing anterolateral approach, patient's left knee space was injected with 48 mg per 3 mL of Monovisc (sodium hyaluronate) in a prefilled syringe was injected easily into the knee through a 22-gauge needle..Patient tolerated the procedure well without immediate complications.   Impression and Recommendations:    The above documentation has been reviewed and is accurate and complete Judi Saa, DO

## 2023-12-09 NOTE — Assessment & Plan Note (Signed)
Chronic, with worsening symptoms.  Still staying significantly active.  We discussed potentially taking it easy for the next 48 hours, icing regimen, home exercises, which activities to do and which ones to avoid.  Increase activity slowly otherwise.  Follow-up with me again in 6 to 8 weeks.

## 2023-12-09 NOTE — Patient Instructions (Signed)
Injections in knee today Good to see you! See you again in

## 2023-12-10 ENCOUNTER — Ambulatory Visit: Payer: BC Managed Care – PPO | Admitting: Family Medicine

## 2023-12-11 ENCOUNTER — Other Ambulatory Visit: Payer: Self-pay

## 2023-12-11 MED ORDER — PREDNISONE 20 MG PO TABS: 40.0000 mg | ORAL_TABLET | Freq: Every day | ORAL | 0 refills | Status: DC

## 2023-12-15 ENCOUNTER — Encounter: Payer: 59 | Admitting: Physical Therapy

## 2023-12-23 NOTE — Progress Notes (Signed)
 Robin Harrington [address] [place] [address] Phone: [phone] Subjective:   Robin Harrington, am serving as a scribe for Dr. Antoine Primas.  I'm seeing this patient by the request  of:  Robin Earing, FNP  CC: Knee pain follow-up  [id number]  12/09/2023 Chronic, with worsening symptoms.  Still staying significantly active.  We discussed potentially taking it easy for the next 48 hours, icing regimen, home exercises, which activities to do and which ones to avoid.  Increase activity slowly otherwise.  Follow-up with me again in 6 to 8 weeks.     Updated 12/25/2023 Robin Harrington is a 58 y.o. female coming in with complaint of knee pain. Still continued knee pain. Looking for advice on what to do.  Patient feels that it is not going to allow her to be as active as she would like to be.       Past Medical History:  Diagnosis Date   Acne    Allergy    Hyperlipidemia    No past surgical history on file. Social History   Socioeconomic History   Marital status: Divorced    Spouse name: Not on file   Number of children: Not on file   Years of education: Not on file   Highest education level: Master's degree (e.g., MA, MS, MEng, MEd, MSW, MBA)  Occupational History   Not on file  Tobacco Use   Smoking status: Never   Smokeless tobacco: Never  Vaping Use   Vaping status: Never Used  Substance and Sexual Activity   Alcohol use: No   Drug use: No   Sexual activity: Not on file  Other Topics Concern   Not on file  Social History Narrative   Not on file   Social Drivers of Health   Financial Resource Strain: Low Risk  (06/01/2023)   Overall Financial Resource Strain (CARDIA)    Difficulty of Paying Living Expenses: Not very hard  Food Insecurity: No Food Insecurity (06/01/2023)   Hunger Vital Sign    Worried About Running Out of Food in the Last Year: Never true    Ran Out of Food in the Last Year: Never true  Transportation  Needs: No Transportation Needs (06/01/2023)   PRAPARE - Administrator, Civil Service (Medical): No    Lack of Transportation (Non-Medical): No  Physical Activity: Sufficiently Active (06/01/2023)   Exercise Vital Sign    Days of Exercise per Week: 4 days    Minutes of Exercise per Session: 90 min  Stress: No Stress Concern Present (06/01/2023)   [employer] of Occupational Health - Occupational Stress Questionnaire    Feeling of Stress : Only a little  Social Connections: Moderately Integrated (06/01/2023)   Social Connection and Isolation Panel [NHANES]    Frequency of Communication with Friends and Family: More than three times a week    Frequency of Social Gatherings with Friends and Family: More than three times a week    Attends Religious Services: More than 4 times per year    Active Member of [employer] or Organizations: Yes    Attends Engineer, structural: More than 4 times per year    Marital Status: Divorced   No Known Allergies Family History  Problem Relation Age of Onset   Hypertension Mother    Atrial fibrillation Mother    Cancer Mother        melanoma    Cancer Father  colon    Heart disease Father    Breast cancer Neg Hx     Current Outpatient Medications (Endocrine & Metabolic):    predniSONE (DELTASONE) 20 MG tablet, Take 2 tablets (40 mg total) by mouth daily with breakfast.  Current Outpatient Medications (Cardiovascular):    rosuvastatin (CRESTOR) 40 MG tablet, Take 1 tablet (40 mg total) by mouth daily.  Current Outpatient Medications (Respiratory):    fluticasone (FLONASE) 50 MCG/ACT nasal spray, Place 2 sprays into both nostrils daily.  Current Outpatient Medications (Analgesics):    meloxicam (MOBIC) 15 MG tablet, Take 1 tablet (15 mg total) by mouth daily.   Current Outpatient Medications (Other):    Doxycycline, Rosacea, (ORACEA) 40 MG CPDR, Take 40 mg by mouth daily.   gabapentin (NEURONTIN) 100 MG capsule, Take 2  capsules (200 mg total) by mouth at bedtime.   omeprazole (PRILOSEC) 40 MG capsule, Take 1 capsule (40 mg total) by mouth daily.   valACYclovir (VALTREX) 1000 MG tablet, Take 1 tablet (1,000 mg total) by mouth daily.   zolpidem (AMBIEN) 5 MG tablet, Take 1 tablet (5 mg total) by mouth at bedtime as needed. for sleep   Reviewed prior external information including notes and imaging from  primary care provider As well as notes that were available from care everywhere and other healthcare systems.  Past medical history, social, surgical and family history all reviewed in electronic medical record.  No pertanent information unless stated regarding to the chief complaint.   Review of Systems:  No headache, visual changes, nausea, vomiting, diarrhea, constipation, dizziness, abdominal pain, skin rash, fevers, chills, night sweats, weight loss, swollen lymph nodes, body aches, joint swelling, chest pain, shortness of breath, mood changes. POSITIVE muscle aches  Objective  Blood pressure 108/68, pulse 67, height 5\' 5"  (1.651 m), weight 165 lb (74.8 kg), SpO2 98%.   General: No apparent distress alert and oriented x3 mood and affect normal, dressed appropriately.  HEENT: Pupils equal, extraocular movements intact  Respiratory: Patient's speak in full sentences and does not appear short of breath  Cardiovascular: No lower extremity edema, non tender, no erythema  Left knee does have trace effusion noted.  Patient does have an effusion noted of the contralateral knee as well.  Tender to palpation more over the medial joint line and actually severely over the right Pez anserine area.      Impression and Recommendations:    The above documentation has been reviewed and is accurate and complete Judi Saa, DO

## 2023-12-25 ENCOUNTER — Ambulatory Visit: Admitting: Family Medicine

## 2023-12-25 VITALS — BP 108/68 | HR 67 | Ht 65.0 in | Wt 165.0 lb

## 2023-12-25 DIAGNOSIS — M7050 Other bursitis of knee, unspecified knee: Secondary | ICD-10-CM | POA: Diagnosis not present

## 2023-12-25 NOTE — Patient Instructions (Addendum)
Happy Birthday!

## 2023-12-26 ENCOUNTER — Encounter: Payer: Self-pay | Admitting: Family Medicine

## 2023-12-26 DIAGNOSIS — M7050 Other bursitis of knee, unspecified knee: Secondary | ICD-10-CM | POA: Insufficient documentation

## 2023-12-26 NOTE — Assessment & Plan Note (Signed)
 Patient given injection and tolerated the procedure well, discussed icing regimen and home exercises, which activities to do and which ones to avoid.  Increase activity slowly over the course of next several weeks.  Do believe that this will be helpful.  Follow-up again in 6 to 8 weeks otherwise.

## 2024-01-04 ENCOUNTER — Telehealth: Payer: Self-pay | Admitting: Family Medicine

## 2024-01-04 DIAGNOSIS — M7050 Other bursitis of knee, unspecified knee: Secondary | ICD-10-CM

## 2024-01-04 NOTE — Telephone Encounter (Signed)
 Patient feels like the shots did not help like they have in the past and would like [place] to order PT for bil knees to [facility] in [place], fax # [phone].

## 2024-01-04 NOTE — Telephone Encounter (Signed)
 PT referral placed to [facility] in [place], fax # [phone].

## 2024-01-19 ENCOUNTER — Encounter: Payer: Self-pay | Admitting: Family Medicine

## 2024-01-19 DIAGNOSIS — B001 Herpesviral vesicular dermatitis: Secondary | ICD-10-CM

## 2024-01-20 MED ORDER — VALACYCLOVIR HCL 1 G PO TABS: 1000.0000 mg | ORAL_TABLET | Freq: Every day | ORAL | 3 refills | Status: DC

## 2024-01-22 NOTE — Progress Notes (Unsigned)
 [name] [address] [place] [address] Phone: [phone] Subjective:   Robin Harrington am a scribe for Dr. Katrinka Blazing.   I'm seeing this patient by the request  of:  [name], FNP  CC: Knee pain  BJY:[id number]  12/25/2023 Patient given injection and tolerated the procedure well, discussed icing regimen and home exercises, which activities to do and which ones to avoid.  Increase activity slowly over the course of next several weeks.  Do believe that this will be helpful.  Follow-up again in 6 to 8 weeks otherwise.     Updated 01/27/2024 Robin Harrington is a 58 y.o. female coming in with complaint of knee pain, found to have more of a Pez anserine bursa. Given injection last exam in March.  Patient states had shoulder surgery. Last Tuesday and was playing pickle ball and missed the ball and she felt the twinge but continued to play the game. Has the stinging in the front of the shoulder. Stopped playing with the right arm and started with the left arm and now the left arm is doing the same thing. Icing the shoulder, no topical medications, or oral medications. Patient states that the knees are ok today.       Past Medical History:  Diagnosis Date   Acne    Allergy    Hyperlipidemia    No past surgical history on file. Social History   Socioeconomic History   Marital status: Divorced    Spouse name: Not on file   Number of children: Not on file   Years of education: Not on file   Highest education level: Master's degree (e.g., MA, MS, MEng, MEd, MSW, MBA)  Occupational History   Not on file  Tobacco Use   Smoking status: Never   Smokeless tobacco: Never  Vaping Use   Vaping status: Never Used  Substance and Sexual Activity   Alcohol use: No   Drug use: No   Sexual activity: Not on file  Other Topics Concern   Not on file  Social History Narrative   Not on file   Social Drivers of Health   Financial Resource Strain: Low  Risk  (06/01/2023)   Overall Financial Resource Strain (CARDIA)    Difficulty of Paying Living Expenses: Not very hard  Food Insecurity: No Food Insecurity (06/01/2023)   Hunger Vital Sign    Worried About Running Out of Food in the Last Year: Never true    Ran Out of Food in the Last Year: Never true  Transportation Needs: No Transportation Needs (06/01/2023)   PRAPARE - Administrator, Civil Service (Medical): No    Lack of Transportation (Non-Medical): No  Physical Activity: Sufficiently Active (06/01/2023)   Exercise Vital Sign    Days of Exercise per Week: 4 days    Minutes of Exercise per Session: 90 min  Stress: No Stress Concern Present (06/01/2023)   [employer] of Occupational Health - Occupational Stress Questionnaire    Feeling of Stress : Only a little  Social Connections: Moderately Integrated (06/01/2023)   Social Connection and Isolation Panel [NHANES]    Frequency of Communication with Friends and Family: More than three times a week    Frequency of Social Gatherings with Friends and Family: More than three times a week    Attends Religious Services: More than 4 times per year    Active Member of [employer] or Organizations: Yes    Attends [employer]  or Organization Meetings: More than 4 times per year    Marital Status: Divorced   No Known Allergies Family History  Problem Relation Age of Onset   Hypertension Mother    Atrial fibrillation Mother    Cancer Mother        melanoma    Cancer Father        colon    Heart disease Father    Breast cancer Neg Hx     Current Outpatient Medications (Endocrine & Metabolic):    predniSONE (DELTASONE) 20 MG tablet, Take 2 tablets (40 mg total) by mouth daily with breakfast.  Current Outpatient Medications (Cardiovascular):    rosuvastatin (CRESTOR) 40 MG tablet, Take 1 tablet (40 mg total) by mouth daily.  Current Outpatient Medications (Respiratory):    fluticasone (FLONASE) 50 MCG/ACT nasal spray, Place 2 sprays  into both nostrils daily.  Current Outpatient Medications (Analgesics):    meloxicam (MOBIC) 15 MG tablet, Take 1 tablet (15 mg total) by mouth daily.   Current Outpatient Medications (Other):    Doxycycline, Rosacea, (ORACEA) 40 MG CPDR, Take 40 mg by mouth daily.   gabapentin (NEURONTIN) 100 MG capsule, Take 2 capsules (200 mg total) by mouth at bedtime.   omeprazole (PRILOSEC) 40 MG capsule, Take 1 capsule (40 mg total) by mouth daily.   valACYclovir (VALTREX) 1000 MG tablet, Take 1 tablet (1,000 mg total) by mouth daily.   zolpidem (AMBIEN) 5 MG tablet, Take 1 tablet (5 mg total) by mouth at bedtime as needed. for sleep   Reviewed prior external information including notes and imaging from  primary care provider As well as notes that were available from care everywhere and other healthcare systems.  Past medical history, social, surgical and family history all reviewed in electronic medical record.  No pertanent information unless stated regarding to the chief complaint.   Review of Systems:  No headache, visual changes, nausea, vomiting, diarrhea, constipation, dizziness, abdominal pain, skin rash, fevers, chills, night sweats, weight loss, swollen lymph nodes, body aches, joint swelling, chest pain, shortness of breath, mood changes. POSITIVE muscle aches  Objective  There were no vitals taken for this visit.   General: No apparent distress alert and oriented x3 mood and affect normal, dressed appropriately.  HEENT: Pupils equal, extraocular movements intact  Respiratory: Patient's speak in full sentences and does not appear short of breath  Cardiovascular: No lower extremity edema, non tender, no erythema  Knee exam shows    Impression and Recommendations:     The above documentation has been reviewed and is accurate and complete Judi Saa, DO

## 2024-01-27 ENCOUNTER — Ambulatory Visit: Payer: 59 | Admitting: Family Medicine

## 2024-01-27 ENCOUNTER — Other Ambulatory Visit: Payer: Self-pay

## 2024-01-27 VITALS — BP 118/60 | HR 87 | Ht 65.0 in

## 2024-01-27 DIAGNOSIS — M75121 Complete rotator cuff tear or rupture of right shoulder, not specified as traumatic: Secondary | ICD-10-CM

## 2024-01-27 DIAGNOSIS — M25511 Pain in right shoulder: Secondary | ICD-10-CM

## 2024-01-27 MED ORDER — CELECOXIB 100 MG PO CAPS: 100.0000 mg | ORAL_CAPSULE | Freq: Two times a day (BID) | ORAL | 0 refills | Status: DC

## 2024-01-27 NOTE — Patient Instructions (Addendum)
 Hep exercises with blue band. Celebrex 100 mg 2 x daily Return in 4 to 6 weeks. Kick butt in the tournament!

## 2024-01-28 ENCOUNTER — Encounter: Payer: Self-pay | Admitting: Family Medicine

## 2024-01-28 NOTE — Assessment & Plan Note (Addendum)
 Has had surgical repair within 6 months.  My hope is that this was some scar tissue that changed.  Patient does not have any significant weakness and should be able to he will either way.  Given home exercises.  Secondary to financial constraints unable to repeat formal physical therapy at the moment.  Discussed potential injections but with the acute aspect of it and not a significant amount of hypoechoic changes do not know if it would make a difference.  We did discuss quickly PRP but I think will be okay.  Follow-up again in 4 weeks otherwise Celebrex prescribed as well for anti-inflammatory and pain reduction.  100 mg twice daily

## 2024-02-23 ENCOUNTER — Other Ambulatory Visit: Payer: Self-pay | Admitting: Family Medicine

## 2024-03-15 ENCOUNTER — Ambulatory Visit: Admitting: Family Medicine

## 2024-03-15 ENCOUNTER — Encounter: Payer: Self-pay | Admitting: Family Medicine

## 2024-03-15 NOTE — Progress Notes (Unsigned)
 [facility] [address] [place] [address] Phone: [phone] Subjective:   Robin Harrington, am serving as a scribe for Dr. Ronnell Coins.  I'm seeing this patient by the request  of:  Albertha Huger, FNP  CC: Right shoulder pain follow-up  BJY:[id number]  01/27/2024 Has had surgical repair within 6 months.  My hope is that this was some scar tissue that changed.  Patient does not have any significant weakness and should be able to he will either way.  Given home exercises.  Secondary to financial constraints unable to repeat formal physical therapy at the moment.  Discussed potential injections but with the acute aspect of it and not a significant amount of hypoechoic changes do not know if it would make a difference.  We did discuss quickly PRP but I think will be okay.  Follow-up again in 4 weeks otherwise Celebrex  prescribed as well for anti-inflammatory and pain reduction.  100 mg twice daily     Update 03/16/2024 Robin Harrington is a 58 y.o. female coming in with complaint of R shoulder pain. Patient states that she has been able to play with little pain. Sore afterwards. Saw surgeon and took a few weeks off per their direction as well as ours. Continues to do HEP.    Recently seen by primary care for questionable UTI    Past Medical History:  Diagnosis Date   Acne    Allergy    Hyperlipidemia    No past surgical history on file. Social History   Socioeconomic History   Marital status: Divorced    Spouse name: Not on file   Number of children: Not on file   Years of education: Not on file   Highest education level: Master's degree (e.g., MA, MS, MEng, MEd, MSW, MBA)  Occupational History   Not on file  Tobacco Use   Smoking status: Never   Smokeless tobacco: Never  Vaping Use   Vaping status: Never Used  Substance and Sexual Activity   Alcohol use: No   Drug use: No   Sexual activity: Not on file  Other Topics Concern   Not  on file  Social History Narrative   Not on file   Social Drivers of Health   Financial Resource Strain: Low Risk  (06/01/2023)   Overall Financial Resource Strain (CARDIA)    Difficulty of Paying Living Expenses: Not very hard  Food Insecurity: No Food Insecurity (06/01/2023)   Hunger Vital Sign    Worried About Running Out of Food in the Last Year: Never true    Ran Out of Food in the Last Year: Never true  Transportation Needs: No Transportation Needs (06/01/2023)   PRAPARE - Administrator, Civil Service (Medical): No    Lack of Transportation (Non-Medical): No  Physical Activity: Sufficiently Active (06/01/2023)   Exercise Vital Sign    Days of Exercise per Week: 4 days    Minutes of Exercise per Session: 90 min  Stress: No Stress Concern Present (06/01/2023)   [employer] of Occupational Health - Occupational Stress Questionnaire    Feeling of Stress : Only a little  Social Connections: Moderately Integrated (06/01/2023)   Social Connection and Isolation Panel [NHANES]    Frequency of Communication with Friends and Family: More than three times a week    Frequency of Social Gatherings with Friends and Family: More than three times a week    Attends Religious Services: More than  4 times per year    Active Member of Clubs or Organizations: Yes    Attends Engineer, structural: More than 4 times per year    Marital Status: Divorced   No Known Allergies Family History  Problem Relation Age of Onset   Hypertension Mother    Atrial fibrillation Mother    Cancer Mother        melanoma    Cancer Father        colon    Heart disease Father    Breast cancer Neg Hx     Current Outpatient Medications (Endocrine & Metabolic):    predniSONE  (DELTASONE ) 20 MG tablet, Take 2 tablets (40 mg total) by mouth daily with breakfast.  Current Outpatient Medications (Cardiovascular):    rosuvastatin  (CRESTOR ) 40 MG tablet, Take 1 tablet (40 mg total) by mouth  daily.  Current Outpatient Medications (Respiratory):    fluticasone  (FLONASE ) 50 MCG/ACT nasal spray, Place 2 sprays into both nostrils daily.  Current Outpatient Medications (Analgesics):    celecoxib  (CELEBREX ) 100 MG capsule, TAKE 1 CAPSULE BY MOUTH TWICE A DAY   meloxicam  (MOBIC ) 15 MG tablet, Take 1 tablet (15 mg total) by mouth daily.   Current Outpatient Medications (Other):    Doxycycline , Rosacea, (ORACEA ) 40 MG CPDR, Take 40 mg by mouth daily.   gabapentin  (NEURONTIN ) 100 MG capsule, Take 2 capsules (200 mg total) by mouth at bedtime.   omeprazole  (PRILOSEC) 40 MG capsule, Take 1 capsule (40 mg total) by mouth daily.   valACYclovir  (VALTREX ) 1000 MG tablet, Take 1 tablet (1,000 mg total) by mouth daily.   zolpidem  (AMBIEN ) 5 MG tablet, Take 1 tablet (5 mg total) by mouth at bedtime as needed. for sleep   Reviewed prior external information including notes and imaging from  primary care provider As well as notes that were available from care everywhere and other healthcare systems.  Past medical history, social, surgical and family history all reviewed in electronic medical record.  No pertanent information unless stated regarding to the chief complaint.   Review of Systems:  No headache, visual changes, nausea, vomiting, diarrhea, constipation, dizziness, abdominal pain, skin rash, fevers, chills, night sweats, weight loss, swollen lymph nodes, body aches, joint swelling, chest pain, shortness of breath, mood changes. POSITIVE muscle aches  Objective  There were no vitals taken for this visit.   General: No apparent distress alert and oriented x3 mood and affect normal, dressed appropriately.  HEENT: Pupils equal, extraocular movements intact  Respiratory: Patient's speak in full sentences and does not appear short of breath  Cardiovascular: No lower extremity edema, non tender, no erythema  Right shoulder exam shows 5/5 strength no impingement  Negative crossover    Limited muscular skeletal ultrasound was performed and interpreted by Ronnell Coins, M  .  Patient has a grade tearing noted of the supraspinatus at this time.  No significant hypoechoic changes in the shoulder at all. Impression: Interval improvement    Impression and Recommendations:    The above documentation has been reviewed and is accurate and complete Robin Mckeen M Kamarie Veno, DO

## 2024-03-16 ENCOUNTER — Encounter: Payer: Self-pay | Admitting: Family Medicine

## 2024-03-16 ENCOUNTER — Ambulatory Visit: Admitting: Family Medicine

## 2024-03-16 ENCOUNTER — Other Ambulatory Visit: Payer: Self-pay

## 2024-03-16 VITALS — BP 110/72 | HR 70 | Ht 65.0 in | Wt 158.0 lb

## 2024-03-16 DIAGNOSIS — M75121 Complete rotator cuff tear or rupture of right shoulder, not specified as traumatic: Secondary | ICD-10-CM

## 2024-03-16 DIAGNOSIS — M25519 Pain in unspecified shoulder: Secondary | ICD-10-CM

## 2024-03-16 NOTE — Patient Instructions (Signed)
 Show your trainer no squats deeper than 90 degrees, no forward lunges, and keeps hands in peripheral vision Be proud of what you've accomplished See you again in 10-12 weeks if needed

## 2024-03-16 NOTE — Assessment & Plan Note (Signed)
 Significant improvement noted.  Discussed icing regimen and home exercises.  Discussed icing regimen and home exercises.  Able to increase activity.  Given some suggestions of what to do with the trainer.

## 2024-03-17 ENCOUNTER — Encounter: Payer: Self-pay | Admitting: Family Medicine

## 2024-03-17 ENCOUNTER — Ambulatory Visit: Admitting: Family Medicine

## 2024-03-17 VITALS — BP 109/61 | HR 79 | Temp 98.0°F | Ht 65.0 in | Wt 156.6 lb

## 2024-03-17 DIAGNOSIS — E782 Mixed hyperlipidemia: Secondary | ICD-10-CM | POA: Diagnosis not present

## 2024-03-17 DIAGNOSIS — K219 Gastro-esophageal reflux disease without esophagitis: Secondary | ICD-10-CM | POA: Diagnosis not present

## 2024-03-17 DIAGNOSIS — N3001 Acute cystitis with hematuria: Secondary | ICD-10-CM | POA: Diagnosis not present

## 2024-03-17 DIAGNOSIS — F5109 Other insomnia not due to a substance or known physiological condition: Secondary | ICD-10-CM | POA: Diagnosis not present

## 2024-03-17 DIAGNOSIS — R319 Hematuria, unspecified: Secondary | ICD-10-CM

## 2024-03-17 LAB — URINALYSIS, ROUTINE W REFLEX MICROSCOPIC
Bilirubin, UA: NEGATIVE
Glucose, UA: NEGATIVE
Leukocytes,UA: NEGATIVE
Nitrite, UA: NEGATIVE
RBC, UA: NEGATIVE
Specific Gravity, UA: >1.03 — ABNORMAL HIGH (ref 1.005–1.030)
Urobilinogen, Ur: 0.2 mg/dL (ref 0.2–1.0)
pH, UA: 5.5 (ref 5.0–7.5)

## 2024-03-17 LAB — MICROSCOPIC EXAMINATION
Bacteria, UA: NONE SEEN
Epithelial Cells (non renal): NONE SEEN /HPF (ref 0–10)
Renal Epithel, UA: NONE SEEN /HPF
Yeast, UA: NONE SEEN

## 2024-03-17 MED ORDER — CEPHALEXIN 500 MG PO CAPS: 500.0000 mg | ORAL_CAPSULE | Freq: Two times a day (BID) | ORAL | 0 refills | Status: AC

## 2024-03-17 NOTE — Progress Notes (Addendum)
 Acute Office Visit  Subjective:     Patient ID: Robin Harrington, female    DOB: [birth year], 58 y.o.   MRN: [id number]  Chief Complaint  Patient presents with   Hematuria    Hematuria This is a new problem. Episode onset: 4 days ago. The problem has been resolved since onset. She describes the hematuria as gross hematuria. The hematuria occurs throughout her entire urinary stream. She describes her urine color as light pink. Irritative symptoms include frequency and urgency. Associated symptoms include hesitancy. Pertinent negatives include no chills, dysuria, fever, flank pain, genital pain, inability to urinate, nausea, urinary retention or vomiting. She is sexually active. There is no history of kidney stones or STDs.   She took some doxycyline that she had on hand when she noted blood in her urine and blood resolved. Urinary symptoms improved until yesterday. She has not taken doxycyline since yesterday morning. Hasn't been taking NSAIDs latley.   She has stopped taking her statin. Doesn't want to take medication if she doesn't need to. She has lost weight. She has been maintaining a healthy diet and regular exercise.   GERD well controlled with PPI.   Requesting refill on ambien  for prn use. She only takes this when she is out of town. She has tried and failed multiple OTC medications without relief. She will have a trip next month and then one in December.   Review of Systems  Constitutional:  Negative for chills and fever.  Gastrointestinal:  Negative for nausea and vomiting.  Genitourinary:  Positive for frequency, hematuria, hesitancy and urgency. Negative for dysuria and flank pain.        Objective:    BP 109/61   Pulse 79   Temp 98 F (36.7 C) (Temporal)   Ht 5\' 5"  (1.651 m)   Wt 156 lb 9.6 oz (71 kg)   SpO2 97%   BMI 26.06 kg/m    Physical Exam Vitals and nursing note reviewed.  Constitutional:      General: She is not in acute distress.    Appearance: She is  not ill-appearing, toxic-appearing or diaphoretic.  Cardiovascular:     Rate and Rhythm: Normal rate and regular rhythm.     Heart sounds: Normal heart sounds. No murmur heard. Abdominal:     General: Bowel sounds are normal. There is no distension.     Palpations: Abdomen is soft.     Tenderness: There is no abdominal tenderness. There is no right CVA tenderness, left CVA tenderness, guarding or rebound.  Musculoskeletal:     Right lower leg: No edema.     Left lower leg: No edema.  Skin:    General: Skin is warm and dry.  Neurological:     General: No focal deficit present.     Mental Status: She is alert and oriented to person, place, and time.  Psychiatric:        Mood and Affect: Mood normal.        Behavior: Behavior normal.     Urine dipstick shows positive for protein.  Micro exam: 0-5 WBC's per HPF and 0-2 RBC's per HPF.      Assessment & Plan:   Arienne was seen today for hematuria.  Diagnoses and all orders for this visit:  Hematuria, unspecified type Resolved after self treatment with doxycycline .  -     Urinalysis, Routine w reflex microscopic  Acute cystitis with hematuria Will treat based on symptoms as she has self treated with  doxycyline. Keflex  as below. Return to office for new or worsening symptoms, or if symptoms persist.  -     Urine Culture -     cephALEXin  (KEFLEX ) 500 MG capsule; Take 1 capsule (500 mg total) by mouth 2 (two) times daily for 7 days.  Mixed hyperlipidemia The 10-year ASCVD risk score (Arnett DK, et al., 2019) is: 2.1%   Values used to calculate the score:     Age: 71 years     Sex: Female     Is Non-Hispanic African American: No     Diabetic: No     Tobacco smoker: No     Systolic Blood Pressure: 109 mmHg     Is BP treated: No     HDL Cholesterol: 65 mg/dL     Total Cholesterol: 245 mg/dL Discussed low risk over all. Discussed diet and exercise and hold statin for next 6 months. Will recheck fasting lipid panel with CPE in 6  months.   Gastroesophageal reflux disease without esophagitis Continue omeprazole .   Situational insomnia PDMP reviewed, no red flags. 30 tablets of ambien  5 mg ordered.   Return in about 6 months (around 09/17/2024) for CPE- ok to schedule on a DOD day.  The patient indicates understanding of these issues and agrees with the plan.  Albertha Huger, FNP

## 2024-03-18 ENCOUNTER — Ambulatory Visit: Admitting: Family Medicine

## 2024-03-19 ENCOUNTER — Encounter: Payer: Self-pay | Admitting: Family Medicine

## 2024-03-19 DIAGNOSIS — F5109 Other insomnia not due to a substance or known physiological condition: Secondary | ICD-10-CM

## 2024-03-19 LAB — URINE CULTURE

## 2024-03-21 ENCOUNTER — Ambulatory Visit: Admitting: Family Medicine

## 2024-03-21 ENCOUNTER — Ambulatory Visit: Payer: Self-pay | Admitting: Family Medicine

## 2024-03-21 MED ORDER — ZOLPIDEM TARTRATE 5 MG PO TABS: 5.0000 mg | ORAL_TABLET | Freq: Every evening | ORAL | 0 refills | Status: DC | PRN

## 2024-03-21 NOTE — Addendum Note (Signed)
 Addended by: Albertha Huger on: 03/21/2024 02:27 PM   Modules accepted: Orders

## 2024-04-13 NOTE — Progress Notes (Unsigned)
 Robin Harrington [address] [place] [address] Phone: [phone] Subjective:    I'Robin seeing this patient by the request  of:  Robin Annabella HERO, FNP  CC: Right foot pain follow-up  YEP:Robin Harrington  Robin Harrington is a 58 y.o. female coming in with complaint of R foot pain. 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 Patient states top of the foot is painful where shoelaces hit. Pain is started coming back 2-3 weeks ago . Is leaving for vacation Saturday        Past Medical History:  Diagnosis Date   Acne    Allergy    Hyperlipidemia    No past surgical history on file. Social History   Socioeconomic History   Marital status: Divorced    Spouse name: Not on file   Number of children: Not on file   Years of education: Not on file   Highest education level: Master's degree (e.g., MA, MS, MEng, MEd, MSW, MBA)  Occupational History   Not on file  Tobacco Use   Smoking status: Never   Smokeless tobacco: Never  Vaping Use   Vaping status: Never Used  Substance and Sexual Activity   Alcohol use: No   Drug use: No   Sexual activity: Not on file  Other Topics Concern   Not on file  Social History Narrative   Not on file   Social Drivers of Health   Financial Resource Strain: Low Risk  (03/16/2024)   Overall Financial Resource Strain (CARDIA)    Difficulty of Paying Living Expenses: Not very hard  Food Insecurity: No Food Insecurity (03/16/2024)   Hunger Vital Sign    Worried About Running Out of Food in the Last Year: Never true    Ran Out of Food in the Last Year: Never true  Transportation Needs: No Transportation Needs (03/16/2024)   PRAPARE - Administrator, Civil Service (Medical): No    Lack of Transportation (Non-Medical): No  Physical Activity: Sufficiently Active (03/16/2024)   Exercise Vital Sign    Days of Exercise per Week: 5 days    Minutes of Exercise per Session: 60 min  Stress: No Stress Concern Present  (03/16/2024)   [employer] of Occupational Health - Occupational Stress Questionnaire    Feeling of Stress : Not at all  Social Connections: Moderately Integrated (03/16/2024)   Social Connection and Isolation Panel    Frequency of Communication with Friends and Family: More than three times a week    Frequency of Social Gatherings with Friends and Family: Once a week    Attends Religious Services: More than 4 times per year    Active Member of [employer] or Organizations: Yes    Attends Engineer, structural: More than 4 times per year    Marital Status: Divorced   No Known Allergies Family History  Problem Relation Age of Onset   Hypertension Mother    Atrial fibrillation Mother    Cancer Mother        melanoma    Cancer Father        colon    Heart disease Father    Breast cancer Neg Hx       Current Outpatient Medications (Respiratory):    fluticasone  (FLONASE ) 50 MCG/ACT nasal spray, Place 2 sprays into both nostrils daily. (Patient not taking: Reported on 03/17/2024)  Current Outpatient Medications (Analgesics):    meloxicam  (MOBIC ) 15 MG tablet, Take 1 tablet (15  mg total) by mouth daily. (Patient not taking: Reported on 03/17/2024)   Current Outpatient Medications (Other):    Doxycycline , Rosacea, (ORACEA ) 40 MG CPDR, Take 40 mg by mouth daily.   omeprazole  (PRILOSEC) 40 MG capsule, Take 1 capsule (40 mg total) by mouth daily.   valACYclovir  (VALTREX ) 1000 MG tablet, Take 1 tablet (1,000 mg total) by mouth daily. (Patient not taking: Reported on 03/17/2024)   zolpidem  (AMBIEN ) 5 MG tablet, Take 1 tablet (5 mg total) by mouth at bedtime as needed. for sleep   Reviewed prior external information including notes and imaging from  primary care provider As well as notes that were available from care everywhere and other healthcare systems.  Past medical history, social, surgical and family history all reviewed in electronic medical record.  No pertanent information  unless stated regarding to the chief complaint.   Review of Systems:  No headache, visual changes, nausea, vomiting, diarrhea, constipation, dizziness, abdominal pain, skin rash, fevers, chills, night sweats, weight loss, swollen lymph nodes, body aches, joint swelling, chest pain, shortness of breath, mood changes. POSITIVE muscle aches  Objective  There were no vitals taken for this visit.   General: No apparent distress alert and oriented x3 mood and affect normal, dressed appropriately.  HEENT: Pupils equal, extraocular movements intact  Respiratory: Patient's speak in full sentences and does not appear short of breath  Cardiovascular: No lower extremity edema, non tender, no erythema  Foot exam shows midfoot arthritis.  Rigid midfoot.  Tender to palpation mostly over the navicular cuboid.  Procedure: Real-time Ultrasound Guided Injection of right navicular cuboid joint Device: GE Logiq Q7 Ultrasound guided injection is preferred based studies that show increased duration, increased effect, greater accuracy, decreased procedural pain, increased response rate, and decreased cost with ultrasound guided versus blind injection.  Verbal informed consent obtained.  Time-out conducted.  Noted no overlying erythema, induration, or other signs of local infection.  Skin prepped in a sterile fashion.  Local anesthesia: Topical Ethyl chloride.  With sterile technique and under real time ultrasound guidance: With a 25-gauge half inch needle injected with 0.5 cc of 0.5% Marcaine and 0.5 cc of Kenalog  40 mg/mL Completed without difficulty  Pain immediately resolved suggesting accurate placement of the medication.  Advised to call if fevers/chills, erythema, induration, drainage, or persistent bleeding.  Images saved Impression: Technically successful ultrasound guided injection.     Impression and Recommendations:    The above documentation has been reviewed and is accurate and complete Robin Harrington  Robin Kylie Simmonds, DO

## 2024-04-14 ENCOUNTER — Encounter: Payer: Self-pay | Admitting: Family Medicine

## 2024-04-14 ENCOUNTER — Other Ambulatory Visit: Payer: Self-pay

## 2024-04-14 ENCOUNTER — Ambulatory Visit: Admitting: Family Medicine

## 2024-04-14 VITALS — BP 124/82 | HR 64 | Ht 65.0 in | Wt 156.0 lb

## 2024-04-14 DIAGNOSIS — M79671 Pain in right foot: Secondary | ICD-10-CM | POA: Diagnosis not present

## 2024-04-14 DIAGNOSIS — M19072 Primary osteoarthritis, left ankle and foot: Secondary | ICD-10-CM | POA: Diagnosis not present

## 2024-04-14 NOTE — Assessment & Plan Note (Signed)
 Patient given injection and tolerated the procedure well, discussed icing regimen and home exercises, discussed avoiding certain activities.  Increase activity slowly.  Follow-up again in 6 to 8 weeks otherwise.

## 2024-05-17 NOTE — Progress Notes (Unsigned)
 [facility] [address] [place] [address] Phone: [phone] Subjective:   Robin Harrington, am serving as a scribe for Dr. Arthea Robin.  I'Robin Harrington seeing this patient by the request  of:  Robin Annabella HERO, FNP  CC: Foot pain follow-up, recurrent right knee pain  YEP:Dlagzrupcz  04/14/2024 Patient given injection and tolerated the procedure well, discussed icing regimen and home exercises, discussed avoiding certain activities.  Increase activity slowly.  Follow-up again in 6 to 8 weeks otherwise.     Updated 05/18/2024 Robin Harrington is a 58 y.o. female coming in with complaint of foot pain, last seen 1 month ago.  Given an injection in the navicular cuboid joint.  Patient states foot is doing great. Injection from last appointment still working. R knee is bothering her.  Patient was seen by another facility and given stem cells.  States that if anything it seems to have made things worse.  Feels like she has more swelling and limited range of motion.      Past Medical History:  Diagnosis Date   Acne    Allergy    Hyperlipidemia    No past surgical history on file. Social History   Socioeconomic History   Marital status: Divorced    Spouse name: Not on file   Number of children: Not on file   Years of education: Not on file   Highest education level: Master's degree (e.g., MA, MS, MEng, MEd, MSW, MBA)  Occupational History   Not on file  Tobacco Use   Smoking status: Never   Smokeless tobacco: Never  Vaping Use   Vaping status: Never Used  Substance and Sexual Activity   Alcohol use: No   Drug use: No   Sexual activity: Not on file  Other Topics Concern   Not on file  Social History Narrative   Not on file   Social Drivers of Health   Financial Resource Strain: Low Risk  (03/16/2024)   Overall Financial Resource Strain (CARDIA)    Difficulty of Paying Living Expenses: Not very hard  Food Insecurity: No Food Insecurity  (03/16/2024)   Hunger Vital Sign    Worried About Running Out of Food in the Last Year: Never true    Ran Out of Food in the Last Year: Never true  Transportation Needs: No Transportation Needs (03/16/2024)   PRAPARE - Administrator, Civil Service (Medical): No    Lack of Transportation (Non-Medical): No  Physical Activity: Sufficiently Active (03/16/2024)   Exercise Vital Sign    Days of Exercise per Week: 5 days    Minutes of Exercise per Session: 60 min  Stress: No Stress Concern Present (03/16/2024)   [employer] of Occupational Health - Occupational Stress Questionnaire    Feeling of Stress : Not at all  Social Connections: Moderately Integrated (03/16/2024)   Social Connection and Isolation Panel    Frequency of Communication with Friends and Family: More than three times a week    Frequency of Social Gatherings with Friends and Family: Once a week    Attends Religious Services: More than 4 times per year    Active Member of [employer] or Organizations: Yes    Attends Engineer, structural: More than 4 times per year    Marital Status: Divorced   No Known Allergies Family History  Problem Relation Age of Onset   Hypertension Mother    Atrial fibrillation Mother    Cancer  Mother        melanoma    Cancer Father        colon    Heart disease Father    Breast cancer Neg Hx       Current Outpatient Medications (Respiratory):    fluticasone  (FLONASE ) 50 MCG/ACT nasal spray, Place 2 sprays into both nostrils daily. (Patient not taking: Reported on 03/17/2024)  Current Outpatient Medications (Analgesics):    meloxicam  (MOBIC ) 15 MG tablet, Take 1 tablet (15 mg total) by mouth daily. (Patient not taking: Reported on 03/17/2024)   Current Outpatient Medications (Other):    Doxycycline , Rosacea, (ORACEA ) 40 MG CPDR, Take 40 mg by mouth daily.   omeprazole  (PRILOSEC) 40 MG capsule, Take 1 capsule (40 mg total) by mouth daily.   valACYclovir  (VALTREX ) 1000 MG  tablet, Take 1 tablet (1,000 mg total) by mouth daily. (Patient not taking: Reported on 03/17/2024)   zolpidem  (AMBIEN ) 5 MG tablet, Take 1 tablet (5 mg total) by mouth at bedtime as needed. for sleep   Reviewed prior external information including notes and imaging from  primary care provider As well as notes that were available from care everywhere and other healthcare systems.  Past medical history, social, surgical and family history all reviewed in electronic medical record.  No pertanent information unless stated regarding to the chief complaint.   Review of Systems:  No headache, visual changes, nausea, vomiting, diarrhea, constipation, dizziness, abdominal pain, skin rash, fevers, chills, night sweats, weight loss, swollen lymph nodes, body aches, joint swelling, chest pain, shortness of breath, mood changes. POSITIVE muscle aches  Objective  Blood pressure 120/74, pulse 79, height 5' 5 (1.651 Robin Harrington), SpO2 97%.   General: No apparent distress alert and oriented x3 mood and affect normal, dressed appropriately.  HEENT: Pupils equal, extraocular movements intact  Respiratory: Patient's speak in full sentences and does not appear short of breath  Cardiovascular: No lower extremity edema, non tender, no erythema  Right knee does have significant effusion noted compared to the left side.  Patient does have limited range of motion noted.  Patient does have crepitus noted of the knee.  Tender to palpation over the medial joint space  Limited muscular skeletal ultrasound was performed and interpreted by Robin Harrington, Robin Harrington  Limited ultrasound shows that there is a significant synovitis with thickening of the synovium itself.  No sign of any infectious etiology though.  He does have significant effusion with hypoechoic changes noted.  In addition to this patient does have what appears to be some displacement of the medial meniscus. Impression:: Synovitis with possible medial meniscal tear  After  informed written and verbal consent, patient was seated on exam table. Right knee was prepped with alcohol swab and utilizing anterolateral approach, patient's right knee space was injected with 4:1  marcaine 0.5%: Kenalog  40mg /dL. Patient tolerated the procedure well without immediate complications.    Impression and Recommendations:     The above documentation has been reviewed and is accurate and complete Robin Poulton Robin Harrington Jeris Easterly, DO

## 2024-05-18 ENCOUNTER — Other Ambulatory Visit: Payer: Self-pay

## 2024-05-18 ENCOUNTER — Encounter: Payer: Self-pay | Admitting: Family Medicine

## 2024-05-18 ENCOUNTER — Ambulatory Visit: Admitting: Family Medicine

## 2024-05-18 ENCOUNTER — Ambulatory Visit (INDEPENDENT_AMBULATORY_CARE_PROVIDER_SITE_OTHER)

## 2024-05-18 VITALS — BP 120/74 | HR 79 | Ht 65.0 in

## 2024-05-18 DIAGNOSIS — M25561 Pain in right knee: Secondary | ICD-10-CM

## 2024-05-18 DIAGNOSIS — M79671 Pain in right foot: Secondary | ICD-10-CM

## 2024-05-18 DIAGNOSIS — M1711 Unilateral primary osteoarthritis, right knee: Secondary | ICD-10-CM

## 2024-05-18 DIAGNOSIS — G8929 Other chronic pain: Secondary | ICD-10-CM

## 2024-05-18 DIAGNOSIS — M1710 Unilateral primary osteoarthritis, unspecified knee: Secondary | ICD-10-CM

## 2024-05-18 NOTE — Assessment & Plan Note (Addendum)
 Repeat injection given today, tolerated the procedure well, discussed icing regimen and home exercises.  Increase activity slowly.  Follow-up again in 6 to 8 weeks could be somewhat of a reaction.  Does have known arthritis, if worsening pain do feel that repeat imaging is necessary.  X-rays ordered today.  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  Follow-up again in 6 to 8 weeks

## 2024-05-18 NOTE — Patient Instructions (Addendum)
 Injection in knee today Xray today Good to see you! Let us  know how you're feeling in 2 weeks, will consider MRI if needed See you again after [facility]

## 2024-06-11 ENCOUNTER — Other Ambulatory Visit: Payer: Self-pay | Admitting: Family Medicine

## 2024-06-11 DIAGNOSIS — M1710 Unilateral primary osteoarthritis, unspecified knee: Secondary | ICD-10-CM

## 2024-06-21 ENCOUNTER — Encounter: Payer: Self-pay | Admitting: Sports Medicine

## 2024-07-11 NOTE — Progress Notes (Unsigned)
 [name] [address] [place] [address] Phone: [phone] Subjective:   Robin Harrington, am serving as a scribe for Dr. Arthea Claudene.  I'm seeing this patient by the request  of:  Joesph Annabella HERO, FNP  CC: Bilateral knee pain right greater than left  YEP:Dlagzrupcz  05/18/2024 Repeat injection given today, tolerated the procedure well, discussed icing regimen and home exercises.  Increase activity slowly.  Follow-up again in 6 to 8 weeks could be somewhat of a reaction.  Does have known arthritis, if worsening pain do feel that repeat imaging is necessary.  X-rays ordered today.  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  Follow-up again in 6 to 8 weeks     Updated 07/12/2024 Robin Harrington is a 58 y.o. female coming in with complaint of R knee pain. Patient states that injection last visit was helpful. Still has pain over medial aspect of knee. Most of her pain is after playing. B knees ache at night when she is sleeping in back of knees. Has to use TENS unit when she drives for relief.   Pain in B glutes that radiates to the back of B knees.        Past Medical History:  Diagnosis Date   Acne    Allergy    Hyperlipidemia    No past surgical history on file. Social History   Socioeconomic History   Marital status: Divorced    Spouse name: Not on file   Number of children: Not on file   Years of education: Not on file   Highest education level: Master's degree (e.g., MA, MS, MEng, MEd, MSW, MBA)  Occupational History   Not on file  Tobacco Use   Smoking status: Never   Smokeless tobacco: Never  Vaping Use   Vaping status: Never Used  Substance and Sexual Activity   Alcohol use: No   Drug use: No   Sexual activity: Not on file  Other Topics Concern   Not on file  Social History Narrative   Not on file   Social Drivers of Health   Financial Resource Strain: Low Risk  (03/16/2024)    Overall Financial Resource Strain (CARDIA)    Difficulty of Paying Living Expenses: Not very hard  Food Insecurity: No Food Insecurity (03/16/2024)   Hunger Vital Sign    Worried About Running Out of Food in the Last Year: Never true    Ran Out of Food in the Last Year: Never true  Transportation Needs: No Transportation Needs (03/16/2024)   PRAPARE - Administrator, Civil Service (Medical): No    Lack of Transportation (Non-Medical): No  Physical Activity: Sufficiently Active (03/16/2024)   Exercise Vital Sign    Days of Exercise per Week: 5 days    Minutes of Exercise per Session: 60 min  Stress: No Stress Concern Present (03/16/2024)   [employer] of Occupational Health - Occupational Stress Questionnaire    Feeling of Stress : Not at all  Social Connections: Moderately Integrated (03/16/2024)   Social Connection and Isolation Panel    Frequency of Communication with Friends and Family: More than three times a week    Frequency of Social Gatherings with Friends and Family: Once a week    Attends Religious Services: More than 4 times per year    Active Member of [employer] or Organizations: Yes    Attends Banker Meetings: More than 4  times per year    Marital Status: Divorced   No Known Allergies Family History  Problem Relation Age of Onset   Hypertension Mother    Atrial fibrillation Mother    Cancer Mother        melanoma    Cancer Father        colon    Heart disease Father    Breast cancer Neg Hx       Current Outpatient Medications (Respiratory):    fluticasone  (FLONASE ) 50 MCG/ACT nasal spray, Place 2 sprays into both nostrils daily.  Current Outpatient Medications (Analgesics):    meloxicam  (MOBIC ) 15 MG tablet, TAKE 1 TABLET (15 MG TOTAL) BY MOUTH DAILY.   Current Outpatient Medications (Other):    Doxycycline , Rosacea, (ORACEA ) 40 MG CPDR, Take 40 mg by mouth daily.   omeprazole  (PRILOSEC) 40 MG capsule, Take 1 capsule (40 mg total)  by mouth daily.   valACYclovir  (VALTREX ) 1000 MG tablet, Take 1 tablet (1,000 mg total) by mouth daily.   zolpidem  (AMBIEN ) 5 MG tablet, Take 1 tablet (5 mg total) by mouth at bedtime as needed. for sleep   Reviewed prior external information including notes and imaging from  primary care provider As well as notes that were available from care everywhere and other healthcare systems.  Past medical history, social, surgical and family history all reviewed in electronic medical record.  No pertanent information unless stated regarding to the chief complaint.   Review of Systems:  No headache, visual changes, nausea, vomiting, diarrhea, constipation, dizziness, abdominal pain, skin rash, fevers, chills, night sweats, weight loss, swollen lymph nodes, body aches, joint swelling, chest pain, shortness of breath, mood changes. POSITIVE muscle aches  Objective  Blood pressure 118/78, height 5' 5 (1.651 m), weight 166 lb (75.3 kg).   General: No apparent distress alert and oriented x3 mood and affect normal, dressed appropriately.  HEENT: Pupils equal, extraocular movements intact  Respiratory: Patient's speak in full sentences and does not appear short of breath  Cardiovascular: No lower extremity edema, non tender, no erythema  Patient's right knee does have a effusion noted.  Some lateral tracking of the patella noted.  Patient does have positive McMurray's noted.  Tenderness to palpation over the medial joint line on the right knee.  Patient does have severe crepitus and new effusion noted on the left knee.  Limited muscular skeletal ultrasound was performed and interpreted by CLAUDENE HUSSAR, M  Limited ultrasound of patient's knee shows a severe synovitis of the knees actually bilaterally.  Patient does have a medial meniscal injury noted with some instability noted. Impression: Medial meniscal injury but bilateral synovitis   Impression and Recommendations:     The above documentation  has been reviewed and is accurate and complete Robin Harrington M Azaiah Licciardi, DO

## 2024-07-12 ENCOUNTER — Ambulatory Visit (INDEPENDENT_AMBULATORY_CARE_PROVIDER_SITE_OTHER): Admitting: Family Medicine

## 2024-07-12 ENCOUNTER — Other Ambulatory Visit: Payer: Self-pay

## 2024-07-12 ENCOUNTER — Ambulatory Visit (INDEPENDENT_AMBULATORY_CARE_PROVIDER_SITE_OTHER)

## 2024-07-12 VITALS — BP 118/78 | Ht 65.0 in | Wt 166.0 lb

## 2024-07-12 DIAGNOSIS — Z6830 Body mass index (BMI) 30.0-30.9, adult: Secondary | ICD-10-CM

## 2024-07-12 DIAGNOSIS — M545 Low back pain, unspecified: Secondary | ICD-10-CM | POA: Diagnosis not present

## 2024-07-12 DIAGNOSIS — M65962 Unspecified synovitis and tenosynovitis, left lower leg: Secondary | ICD-10-CM | POA: Insufficient documentation

## 2024-07-12 DIAGNOSIS — G8929 Other chronic pain: Secondary | ICD-10-CM | POA: Diagnosis not present

## 2024-07-12 DIAGNOSIS — M1710 Unilateral primary osteoarthritis, unspecified knee: Secondary | ICD-10-CM | POA: Diagnosis not present

## 2024-07-12 DIAGNOSIS — M25562 Pain in left knee: Secondary | ICD-10-CM | POA: Diagnosis not present

## 2024-07-12 DIAGNOSIS — M25561 Pain in right knee: Secondary | ICD-10-CM

## 2024-07-12 DIAGNOSIS — M65961 Unspecified synovitis and tenosynovitis, right lower leg: Secondary | ICD-10-CM

## 2024-07-12 NOTE — Assessment & Plan Note (Signed)
 Known patellofemoral arthritis bilaterally.  Unfortunately no now having worsening synovitis and seems to be right greater than left.  Do feel that patient has done all conservative therapy over the multiple years at this time.  Patient may need the possibility of surgical intervention and do feel that MRI is necessary at this time to further evaluate.  Patient would like to consider both knees to see if anything else is contributing.  Will try to get MRI for both of them.  Would have done injections in both of them over the years starting in 2022.  Now affecting daily activities such as difficulty where it is even waking her up at night.  From 9 out of 10 pain sometimes no longer responding to anti-inflammatories or even steroids.

## 2024-07-12 NOTE — Patient Instructions (Addendum)
 Xray today MRI [id number] We will be in touch

## 2024-07-12 NOTE — Assessment & Plan Note (Signed)
 Low back pain that is likely more secondary to muscle imbalances.  Discussed icing regimen and home exercises, discussed which activities to do and which ones to avoid.  Increase activity slowly.  Follow-up again in 6 to 8 weeks

## 2024-07-18 ENCOUNTER — Ambulatory Visit: Payer: Self-pay | Admitting: Family Medicine

## 2024-07-18 DIAGNOSIS — G8929 Other chronic pain: Secondary | ICD-10-CM

## 2024-07-23 ENCOUNTER — Other Ambulatory Visit

## 2024-07-25 ENCOUNTER — Encounter: Payer: Self-pay | Admitting: Family Medicine

## 2024-07-26 ENCOUNTER — Encounter: Payer: Self-pay | Admitting: Family Medicine

## 2024-07-26 DIAGNOSIS — E559 Vitamin D deficiency, unspecified: Secondary | ICD-10-CM

## 2024-07-26 DIAGNOSIS — Z0000 Encounter for general adult medical examination without abnormal findings: Secondary | ICD-10-CM

## 2024-08-01 ENCOUNTER — Ambulatory Visit
Admission: RE | Admit: 2024-08-01 | Discharge: 2024-08-01 | Disposition: A | Source: Ambulatory Visit | Attending: Family Medicine | Admitting: Family Medicine

## 2024-08-01 ENCOUNTER — Ambulatory Visit
Admission: RE | Admit: 2024-08-01 | Discharge: 2024-08-01 | Disposition: A | Discharge status: Home / Self Care | Source: Ambulatory Visit | Attending: Family Medicine | Admitting: Family Medicine

## 2024-08-01 DIAGNOSIS — G8929 Other chronic pain: Secondary | ICD-10-CM

## 2024-08-03 NOTE — Addendum Note (Signed)
 Addended by: Alvan Culpepper G on: 08/03/2024 04:05 PM   Modules accepted: Orders

## 2024-08-04 ENCOUNTER — Ambulatory Visit: Admitting: Family Medicine

## 2024-08-04 ENCOUNTER — Encounter: Payer: Self-pay | Admitting: Family Medicine

## 2024-08-04 ENCOUNTER — Telehealth: Payer: Self-pay

## 2024-08-04 VITALS — BP 110/76 | HR 78 | Ht 65.0 in

## 2024-08-04 DIAGNOSIS — M170 Bilateral primary osteoarthritis of knee: Secondary | ICD-10-CM

## 2024-08-04 NOTE — Telephone Encounter (Signed)
 Patient ran for Monovisc for bilateral knees on 08/04/24. [facility] [phone]. Pending approval.

## 2024-08-04 NOTE — Telephone Encounter (Signed)
-----   Message from Robin Harrington sent at 08/04/2024  1:58 PM EDT ----- Regarding: visco Can you please run patient for visco for B knees?  Thanks

## 2024-08-04 NOTE — Assessment & Plan Note (Signed)
 Chronic problem with MRIs in 2025 showing moderate to some places of severe arthritic changes noted.  Discussed icing regimen and home exercises, which activities to do and which ones to avoid.  Increase activity slowly.  Discussed icing regimen.  Follow-up again 6 to 8 weeks otherwise.  Can be a candidate for viscosupplementation.  Patient wants to avoid surgical intervention if possible.

## 2024-08-04 NOTE — Patient Instructions (Addendum)
 Injected both knees today Saucony shoes See me in 2 months

## 2024-08-04 NOTE — Progress Notes (Signed)
 [facility] [address] [place] [address] Phone: [phone] Subjective:   Robin Harrington, am serving as a scribe for Dr. Arthea Harrington.  I'm seeing this patient by the request  of:  Robin Annabella HERO, FNP  CC: Right knee pain, left knee pain  YEP:Dlagzrupcz  Robin Harrington is a 58 y.o. female coming in with complaint of R knee pain. Patient states that she played last night. Pain in both knees especially after sitting.  MRI does show the patient did have at least moderate if not severe arthritic changes and underneath the patellofemoral joint.    Past Medical History:  Diagnosis Date   Acne    Allergy    Hyperlipidemia    No past surgical history on file. Social History   Socioeconomic History   Marital status: Divorced    Spouse name: Not on file   Number of children: Not on file   Years of education: Not on file   Highest education level: Master's degree (e.g., MA, MS, MEng, MEd, MSW, MBA)  Occupational History   Not on file  Tobacco Use   Smoking status: Never   Smokeless tobacco: Never  Vaping Use   Vaping status: Never Used  Substance and Sexual Activity   Alcohol use: No   Drug use: No   Sexual activity: Not on file  Other Topics Concern   Not on file  Social History Narrative   Not on file   Social Drivers of Health   Financial Resource Strain: Low Risk  (03/16/2024)   Overall Financial Resource Strain (CARDIA)    Difficulty of Paying Living Expenses: Not very hard  Food Insecurity: No Food Insecurity (03/16/2024)   Hunger Vital Sign    Worried About Running Out of Food in the Last Year: Never true    Ran Out of Food in the Last Year: Never true  Transportation Needs: No Transportation Needs (03/16/2024)   PRAPARE - Administrator, Civil Service (Medical): No    Lack of Transportation (Non-Medical): No  Physical Activity: Sufficiently Active (03/16/2024)   Exercise Vital Sign    Days of Exercise per  Week: 5 days    Minutes of Exercise per Session: 60 min  Stress: No Stress Concern Present (03/16/2024)   Robin Harrington of Occupational Health - Occupational Stress Questionnaire    Feeling of Stress : Not at all  Social Connections: Moderately Integrated (03/16/2024)   Social Connection and Isolation Panel    Frequency of Communication with Friends and Family: More than three times a week    Frequency of Social Gatherings with Friends and Family: Once a week    Attends Religious Services: More than 4 times per year    Active Member of [employer] or Organizations: Yes    Attends Engineer, structural: More than 4 times per year    Marital Status: Divorced   No Known Allergies Family History  Problem Relation Age of Onset   Hypertension Mother    Atrial fibrillation Mother    Cancer Mother        melanoma    Cancer Father        colon    Heart disease Father    Breast cancer Neg Hx       Current Outpatient Medications (Respiratory):    fluticasone  (FLONASE ) 50 MCG/ACT nasal spray, Place 2 sprays into both nostrils daily.  Current Outpatient Medications (Analgesics):    meloxicam  (MOBIC ) 15  MG tablet, TAKE 1 TABLET (15 MG TOTAL) BY MOUTH DAILY.   Current Outpatient Medications (Other):    Doxycycline , Rosacea, (ORACEA ) 40 MG CPDR, Take 40 mg by mouth daily.   omeprazole  (PRILOSEC) 40 MG capsule, Take 1 capsule (40 mg total) by mouth daily.   valACYclovir  (VALTREX ) 1000 MG tablet, Take 1 tablet (1,000 mg total) by mouth daily.   zolpidem  (AMBIEN ) 5 MG tablet, Take 1 tablet (5 mg total) by mouth at bedtime as needed. for sleep   Reviewed prior external information including notes and imaging from  primary care provider As well as notes that were available from care everywhere and other healthcare systems.  Past medical history, social, surgical and family history all reviewed in electronic medical record.  No pertanent information unless stated regarding to the chief  complaint.   Review of Systems:  No headache, visual changes, nausea, vomiting, diarrhea, constipation, dizziness, abdominal pain, skin rash, fevers, chills, night sweats, weight loss, swollen lymph nodes, body aches, joint swelling, chest pain, shortness of breath, mood changes. POSITIVE muscle aches  Objective  Blood pressure 110/76, pulse 78, height 5' 5 (1.651 m), SpO2 98%.   General: No apparent distress alert and oriented x3 mood and affect normal, dressed appropriately.  HEENT: Pupils equal, extraocular movements intact  Respiratory: Patient's speak in full sentences and does not appear short of breath  Cardiovascular: No lower extremity edema, non tender, no erythema  Bilateral knees have trace effusion noted.   After informed written and verbal consent, patient was seated on exam table. Right knee was prepped with alcohol swab and utilizing anterolateral approach, patient's right knee space was injected with 4:1  marcaine 0.5%: Kenalog  40mg /dL. Patient tolerated the procedure well without immediate complications.  After informed written and verbal consent, patient was seated on exam table. Left knee was prepped with alcohol swab and utilizing anterolateral approach, patient's left knee space was injected with 4:1  marcaine 0.5%: Kenalog  40mg /dL. Patient tolerated the procedure well without immediate complications.   Impression and Recommendations:    The above documentation has been reviewed and is accurate and complete Libbie Bartley M Rune Mendez, DO

## 2024-08-08 NOTE — Telephone Encounter (Signed)
 Patient needs an appointment once medication is stocked.   Monovisc for bilateral knees approved.   Deductible does not apply. Only one copay applies per date of service. Once the OOP has been met, patient is covered at 100%. Prior Authorization for the drug, is required and on file. PA Reference #[id number], valid from 12/02/23 to 11/30/24. Confirmed with Zada FERNS [id number].   Exp: 11/30/2024 (from previous authorization listed above)

## 2024-08-09 NOTE — Telephone Encounter (Signed)
 Noted

## 2024-08-30 ENCOUNTER — Other Ambulatory Visit: Payer: Self-pay

## 2024-08-30 ENCOUNTER — Telehealth: Payer: Self-pay | Admitting: Family Medicine

## 2024-08-30 DIAGNOSIS — Z0000 Encounter for general adult medical examination without abnormal findings: Secondary | ICD-10-CM

## 2024-08-30 DIAGNOSIS — E559 Vitamin D deficiency, unspecified: Secondary | ICD-10-CM

## 2024-08-30 NOTE — Telephone Encounter (Signed)
 [facility] FAXED SURGICAL CLEARANCE FORM FOR PT. PT SCHEDULE APPOINTMENT  FOR 11/07/24 WITH [facility], PT'S SURGERY IS ON 12/15/24. WILL PUT FORM AT NURSING STATION. NURSE IF OFF TODAY

## 2024-08-31 LAB — CMP14+EGFR
ALT: 18 IU/L (ref 0–32)
AST: 25 IU/L (ref 0–40)
Albumin: 4.3 g/dL (ref 3.8–4.9)
Alkaline Phosphatase: 56 IU/L (ref 49–135)
BUN/Creatinine Ratio: 23 (ref 9–23)
BUN: 17 mg/dL (ref 6–24)
Bilirubin Total: 0.4 mg/dL (ref 0.0–1.2)
CO2: 22 mmol/L (ref 20–29)
Calcium: 9.2 mg/dL (ref 8.7–10.2)
Chloride: 103 mmol/L (ref 96–106)
Creatinine, Ser: 0.74 mg/dL (ref 0.57–1.00)
Globulin, Total: 1.8 g/dL (ref 1.5–4.5)
Glucose: 96 mg/dL (ref 70–99)
Potassium: 3.9 mmol/L (ref 3.5–5.2)
Sodium: 140 mmol/L (ref 134–144)
Total Protein: 6.1 g/dL (ref 6.0–8.5)
eGFR: 94 mL/min/1.73 (ref >59)

## 2024-08-31 LAB — CBC WITH DIFFERENTIAL/PLATELET
Basophils Absolute: 0 x10E3/uL (ref 0.0–0.2)
Basos: 1 %
EOS (ABSOLUTE): 0.1 x10E3/uL (ref 0.0–0.4)
Eos: 2 %
Hematocrit: 38.7 % (ref 34.0–46.6)
Hemoglobin: 12.2 g/dL (ref 11.1–15.9)
Immature Grans (Abs): 0 x10E3/uL (ref 0.0–0.1)
Immature Granulocytes: 0 %
Lymphocytes Absolute: 1.2 x10E3/uL (ref 0.7–3.1)
Lymphs: 28 %
MCH: 26.8 pg (ref 26.6–33.0)
MCHC: 31.5 g/dL (ref 31.5–35.7)
MCV: 85 fL (ref 79–97)
Monocytes Absolute: 0.4 x10E3/uL (ref 0.1–0.9)
Monocytes: 9 %
Neutrophils Absolute: 2.5 x10E3/uL (ref 1.4–7.0)
Neutrophils: 60 %
Platelets: 286 x10E3/uL (ref 150–450)
RBC: 4.56 x10E6/uL (ref 3.77–5.28)
RDW: 13.1 % (ref 11.7–15.4)
WBC: 4.1 x10E3/uL (ref 3.4–10.8)

## 2024-08-31 LAB — LIPID PANEL
Chol/HDL Ratio: 4.1 ratio (ref 0.0–4.4)
Cholesterol, Total: 363 mg/dL — ABNORMAL HIGH (ref 100–199)
HDL: 89 mg/dL (ref >39)
LDL Chol Calc (NIH): 257 mg/dL — ABNORMAL HIGH (ref 0–99)
Triglycerides: 103 mg/dL (ref 0–149)
VLDL Cholesterol Cal: 17 mg/dL (ref 5–40)

## 2024-08-31 LAB — VITAMIN D 25 HYDROXY (VIT D DEFICIENCY, FRACTURES): Vit D, 25-Hydroxy: 23.9 ng/mL — AB (ref 30.0–100.0)

## 2024-08-31 LAB — TSH: TSH: 2.55 u[IU]/mL (ref 0.450–4.500)

## 2024-09-12 ENCOUNTER — Ambulatory Visit: Payer: Self-pay | Admitting: Family Medicine

## 2024-09-12 ENCOUNTER — Encounter: Payer: Self-pay | Admitting: Family Medicine

## 2024-09-12 VITALS — BP 114/61 | HR 55 | Temp 97.5°F | Ht 65.0 in | Wt 179.1 lb

## 2024-09-12 DIAGNOSIS — E559 Vitamin D deficiency, unspecified: Secondary | ICD-10-CM

## 2024-09-12 DIAGNOSIS — M170 Bilateral primary osteoarthritis of knee: Secondary | ICD-10-CM

## 2024-09-12 DIAGNOSIS — K219 Gastro-esophageal reflux disease without esophagitis: Secondary | ICD-10-CM

## 2024-09-12 DIAGNOSIS — Z0001 Encounter for general adult medical examination with abnormal findings: Secondary | ICD-10-CM

## 2024-09-12 DIAGNOSIS — Z0000 Encounter for general adult medical examination without abnormal findings: Secondary | ICD-10-CM

## 2024-09-12 DIAGNOSIS — B001 Herpesviral vesicular dermatitis: Secondary | ICD-10-CM

## 2024-09-12 DIAGNOSIS — E782 Mixed hyperlipidemia: Secondary | ICD-10-CM

## 2024-09-12 MED ORDER — ROSUVASTATIN CALCIUM 40 MG PO TABS: 40.0000 mg | ORAL_TABLET | Freq: Every day | ORAL | 3 refills | Status: AC

## 2024-09-12 MED ORDER — VALACYCLOVIR HCL 1 G PO TABS: 1000.0000 mg | ORAL_TABLET | Freq: Every day | ORAL | 3 refills | Status: AC

## 2024-09-12 MED ORDER — VITAMIN D (ERGOCALCIFEROL) 1.25 MG (50000 UNIT) PO CAPS: 50000.0000 [IU] | ORAL_CAPSULE | ORAL | 0 refills | Status: AC

## 2024-09-12 MED ORDER — OMEPRAZOLE 40 MG PO CPDR: 40.0000 mg | DELAYED_RELEASE_CAPSULE | Freq: Every day | ORAL | 3 refills | Status: AC

## 2024-09-12 NOTE — Patient Instructions (Addendum)
 Vitamin D  level is low. I have sent in a weekly supplement for you to take for the next 12 weeks. After that, take an daily OTC vitamin D  supplement with 1500-2000 IU a day.   Health Maintenance, Female Adopting a healthy lifestyle and getting preventive care are important in promoting health and wellness. Ask your health care provider about: The right schedule for you to have regular tests and exams. Things you can do on your own to prevent diseases and keep yourself healthy. What should I know about diet, weight, and exercise? Eat a healthy diet  Eat a diet that includes plenty of vegetables, fruits, low-fat dairy products, and lean protein. Do not eat a lot of foods that are high in solid fats, added sugars, or sodium. Maintain a healthy weight Body mass index (BMI) is used to identify weight problems. It estimates body fat based on height and weight. Your health care provider can help determine your BMI and help you achieve or maintain a healthy weight. Get regular exercise Get regular exercise. This is one of the most important things you can do for your health. Most adults should: Exercise for at least 150 minutes each week. The exercise should increase your heart rate and make you sweat (moderate-intensity exercise). Do strengthening exercises at least twice a week. This is in addition to the moderate-intensity exercise. Spend less time sitting. Even light physical activity can be beneficial. Watch cholesterol and blood lipids Have your blood tested for lipids and cholesterol at 58 years of age, then have this test every 5 years. Have your cholesterol levels checked more often if: Your lipid or cholesterol levels are high. You are older than 58 years of age. You are at high risk for heart disease. What should I know about cancer screening? Depending on your health history and family history, you may need to have cancer screening at various ages. This may include screening  for: Breast cancer. Cervical cancer. Colorectal cancer. Skin cancer. Lung cancer. What should I know about heart disease, diabetes, and high blood pressure? Blood pressure and heart disease High blood pressure causes heart disease and increases the risk of stroke. This is more likely to develop in people who have high blood pressure readings or are overweight. Have your blood pressure checked: Every 3-5 years if you are 35-41 years of age. Every year if you are 52 years old or older. Diabetes Have regular diabetes screenings. This checks your fasting blood sugar level. Have the screening done: Once every three years after age 33 if you are at a normal weight and have a low risk for diabetes. More often and at a younger age if you are overweight or have a high risk for diabetes. What should I know about preventing infection? Hepatitis B If you have a higher risk for hepatitis B, you should be screened for this virus. Talk with your health care provider to find out if you are at risk for hepatitis B infection. Hepatitis C Testing is recommended for: Everyone born from [birth year] through 1965. Anyone with known risk factors for hepatitis C. Sexually transmitted infections (STIs) Get screened for STIs, including gonorrhea and chlamydia, if: You are sexually active and are younger than 58 years of age. You are older than 58 years of age and your health care provider tells you that you are at risk for this type of infection. Your sexual activity has changed since you were last screened, and you are at increased risk for chlamydia or gonorrhea.  Ask your health care provider if you are at risk. Ask your health care provider about whether you are at high risk for HIV. Your health care provider may recommend a prescription medicine to help prevent HIV infection. If you choose to take medicine to prevent HIV, you should first get tested for HIV. You should then be tested every 3 months for as long as you  are taking the medicine. Pregnancy If you are about to stop having your period (premenopausal) and you may become pregnant, seek counseling before you get pregnant. Take 400 to 800 micrograms (mcg) of folic acid every day if you become pregnant. Ask for birth control (contraception) if you want to prevent pregnancy. Osteoporosis and menopause Osteoporosis is a disease in which the bones lose minerals and strength with aging. This can result in bone fractures. If you are 56 years old or older, or if you are at risk for osteoporosis and fractures, ask your health care provider if you should: Be screened for bone loss. Take a calcium  or vitamin D  supplement to lower your risk of fractures. Be given hormone replacement therapy (HRT) to treat symptoms of menopause. Follow these instructions at home: Alcohol use Do not drink alcohol if: Your health care provider tells you not to drink. You are pregnant, may be pregnant, or are planning to become pregnant. If you drink alcohol: Limit how much you have to: 0-1 drink a day. Know how much alcohol is in your drink. In the [place] one drink equals one 12 oz bottle of beer (355 mL), one 5 oz glass of wine (148 mL), or one 1 oz glass of hard liquor (44 mL). Lifestyle Do not use any products that contain nicotine or tobacco. These products include cigarettes, chewing tobacco, and vaping devices, such as e-cigarettes. If you need help quitting, ask your health care provider. Do not use street drugs. Do not share needles. Ask your health care provider for help if you need support or information about quitting drugs. General instructions Schedule regular health, dental, and eye exams. Stay current with your vaccines. Tell your health care provider if: You often feel depressed. You have ever been abused or do not feel safe at home. Summary Adopting a healthy lifestyle and getting preventive care are important in promoting health and wellness. Follow your  health care provider's instructions about healthy diet, exercising, and getting tested or screened for diseases. Follow your health care provider's instructions on monitoring your cholesterol and blood pressure. This information is not intended to replace advice given to you by your health care provider. Make sure you discuss any questions you have with your health care provider. Document Revised: 02/25/2021 Document Reviewed: 02/25/2021 Elsevier Patient Education  2024 [employer].

## 2024-09-12 NOTE — Progress Notes (Signed)
 Complete physical exam  Patient: Robin Harrington   DOB: 05/28/1966   58 y.o. Female  MRN: [id number]  Subjective:    Chief Complaint  Patient presents with   Annual Exam    Robin Harrington is a 58 y.o. female who presents today for a complete physical exam. She reports consuming a general diet. Exercise has decreased due to chronic knee pain but she tries to stay active on good days. She is planning for replacements in February.  She generally feels well. She reports sleeping well. She does have additional problems to discuss today.   Knee pain and osteoarthritis - Ongoing knee pain attributed to osteoarthritis - Currently taking diclofenac  for pain management, previously on meloxicam  - Knee pain limits ability to play pickleball and participate in activities with her granddaughter; attempts to play minimally   Situational insomnia - Primary with travel - Occasional use of Ambien  for sleep disturbances related to knee pain, taken twice recently, with approximately six pills remaining, primarily used for travel  Hyperlipidemia and dyslipidemia - Recent laboratory results show significant increase in cholesterol levels - LDL cholesterol elevated at 257 mg/dL, increased from 841 mg/dL one year ago - Restarted rosuvastatin  40 mg a few nights ago despite aversion to statins due to side effects and negative information encountered online - History of cholesterol spike to 450 mg/dL while on Accutane in her 61s, which was subsequently discontinued  Gastroesophageal reflux symptoms - Experiences acid reflux - No recent dysphagia, chest pain, nausea, or vomiting - Continues omeprazole  for symptom management  Vitamin d  deficiency - Vitamin D  level low at 23 ng/mL - Not currently taking a vitamin D  supplement  Herpes simplex virus suppression - Takes Valtrex , half a tablet every other day, for outbreak prevention - No recent outbreaks  Medication management - Not currently taking  doxycycline  or Flonase  and requests removal from medication list       Most recent fall risk assessment:    09/12/2024    4:16 PM  Fall Risk   Falls in the past year? 0  Risk for fall due to : No Fall Risks  Follow up Falls evaluation completed     Most recent depression screenings:    09/12/2024    4:16 PM 03/17/2024    1:16 PM  PHQ 2/9 Scores  PHQ - 2 Score 0 0  PHQ- 9 Score 2 1      Data saved with a previous flowsheet row definition        Patient Care Team: Joesph Robin HERO, FNP as PCP - General (Family Medicine)   Outpatient Medications Prior to Visit  Medication Sig   diclofenac  (VOLTAREN ) 75 MG EC tablet Take 75 mg by mouth 2 (two) times daily.   omeprazole  (PRILOSEC) 40 MG capsule Take 1 capsule (40 mg total) by mouth daily.   valACYclovir  (VALTREX ) 1000 MG tablet Take 1 tablet (1,000 mg total) by mouth daily.   zolpidem  (AMBIEN ) 5 MG tablet Take 1 tablet (5 mg total) by mouth at bedtime as needed. for sleep   Doxycycline , Rosacea, (ORACEA ) 40 MG CPDR Take 40 mg by mouth daily. (Patient not taking: Reported on 09/12/2024)   fluticasone  (FLONASE ) 50 MCG/ACT nasal spray Place 2 sprays into both nostrils daily. (Patient not taking: Reported on 09/12/2024)   [DISCONTINUED] meloxicam  (MOBIC ) 15 MG tablet TAKE 1 TABLET (15 MG TOTAL) BY MOUTH DAILY. (Patient not taking: Reported on 09/12/2024)   No facility-administered medications prior to visit.  ROS Negative unless specially indicated above in HPI.     Objective:     BP 114/61   Pulse (!) 55   Temp (!) 97.5 F (36.4 C)   Ht 5' 5 (1.651 m)   Wt 179 lb 2 oz (81.3 kg)   SpO2 96%   BMI 29.81 kg/m    Physical Exam Vitals and nursing note reviewed.  Constitutional:      General: She is not in acute distress.    Appearance: She is not ill-appearing, toxic-appearing or diaphoretic.  HENT:     Head: Normocephalic.     Right Ear: Tympanic membrane, ear canal and external ear normal.     Left Ear:  Tympanic membrane, ear canal and external ear normal.     Nose: Nose normal.     Mouth/Throat:     Mouth: Mucous membranes are moist.     Pharynx: Oropharynx is clear.  Eyes:     Extraocular Movements: Extraocular movements intact.     Conjunctiva/sclera: Conjunctivae normal.     Pupils: Pupils are equal, round, and reactive to light.  Neck:     Thyroid: No thyroid mass, thyromegaly or thyroid tenderness.  Cardiovascular:     Rate and Rhythm: Normal rate and regular rhythm.     Pulses: Normal pulses.     Heart sounds: Normal heart sounds. No murmur heard.    No friction rub. No gallop.  Pulmonary:     Effort: Pulmonary effort is normal.     Breath sounds: Normal breath sounds.  Abdominal:     General: Bowel sounds are normal. There is no distension.     Palpations: Abdomen is soft. There is no mass.     Tenderness: There is no abdominal tenderness. There is no guarding.  Musculoskeletal:        General: No tenderness.     Cervical back: Normal range of motion and neck supple. No tenderness.     Right lower leg: No edema.     Left lower leg: No edema.  Skin:    General: Skin is warm and dry.     Capillary Refill: Capillary refill takes less than 2 seconds.     Findings: No lesion or rash.  Neurological:     General: No focal deficit present.     Mental Status: She is alert and oriented to person, place, and time.     Cranial Nerves: No cranial nerve deficit.     Motor: No weakness.     Gait: Gait normal.  Psychiatric:        Mood and Affect: Mood normal.        Behavior: Behavior normal.        Thought Content: Thought content normal.        Judgment: Judgment normal.      No results found for any visits on 09/12/24.     Assessment & Plan:    Routine Health Maintenance and Physical Exam  Robin Harrington was seen today for annual exam.  Diagnoses and all orders for this visit:  Routine general medical examination at a health care facility  Mixed hyperlipidemia -      rosuvastatin  (CRESTOR ) 40 MG tablet; Take 1 tablet (40 mg total) by mouth daily.  Vitamin D  deficiency -     Vitamin D , Ergocalciferol , (DRISDOL ) 1.25 MG (50000 UNIT) CAPS capsule; Take 1 capsule (50,000 Units total) by mouth every 7 (seven) days.  Gastroesophageal reflux disease without esophagitis -  omeprazole  (PRILOSEC) 40 MG capsule; Take 1 capsule (40 mg total) by mouth daily.  Fever blister -     valACYclovir  (VALTREX ) 1000 MG tablet; Take 1 tablet (1,000 mg total) by mouth daily.  Primary osteoarthritis of both knees    Immunization History  Administered Date(s) Administered   Influenza,inj,Quad PF,6+ Mos 07/14/2015, 05/28/2019, 07/12/2020   Influenza-Unspecified 09/09/2016, 07/17/2024   PFIZER(Purple Top)SARS-COV-2 Vaccination 01/04/2020, 01/30/2020   PPD Test 03/18/2022   Tdap 07/12/2020    Health Maintenance  Topic Date Due   Hepatitis B Vaccines 19-59 Average Risk (1 of 3 - 19+ 3-dose series) Never done   Cervical Cancer Screening (HPV/Pap Cotest)  01/12/2024   Mammogram  03/18/2024   COVID-19 Vaccine (3 - 2025-26 season) 06/20/2024   Zoster Vaccines- Shingrix (1 of 2) 12/13/2024 (Originally 12/25/2015)   Pneumococcal Vaccine: 50+ Years (1 of 1 - PCV) 09/12/2025 (Originally 12/25/2015)   DTaP/Tdap/Td (2 - Td or Tdap) 07/12/2030   Colonoscopy  03/26/2031   Influenza Vaccine  Completed   Hepatitis C Screening  Completed   HIV Screening  Completed   HPV VACCINES  Aged Out   Meningococcal B Vaccine  Aged Out    Discussed health benefits of physical activity, and encouraged her to engage in regular exercise appropriate for her age and condition.  Problem List Items Addressed This Visit       Digestive   Gastroesophageal reflux disease   Relevant Medications   omeprazole  (PRILOSEC) 40 MG capsule   Fever blister   Relevant Medications   valACYclovir  (VALTREX ) 1000 MG tablet     Musculoskeletal and Integument   Degenerative arthritis of knee, bilateral    Relevant Medications   diclofenac  (VOLTAREN ) 75 MG EC tablet     Other   Mixed hyperlipidemia   Relevant Medications   rosuvastatin  (CRESTOR ) 40 MG tablet   Vitamin D  deficiency   Relevant Medications   Vitamin D , Ergocalciferol , (DRISDOL ) 1.25 MG (50000 UNIT) CAPS capsule   Other Visit Diagnoses       Routine general medical examination at a health care facility    -  Primary      Keep surgical clearance appt.   The patient indicates understanding of these issues and agrees with the plan.  Robin CHRISTELLA Search, FNP

## 2024-11-03 NOTE — Progress Notes (Unsigned)
 " [facility] [address] [place] [address] Phone: [phone] Subjective:    I'm seeing this patient by the request  of:  Joesph Annabella HERO, FNP  CC: Bilateral knee pain  YEP:Dlagzrupcz  08/04/2024 Chronic problem with MRIs in 2025 showing moderate to some places of severe arthritic changes noted.  Discussed icing regimen and home exercises, which activities to do and which ones to avoid.  Increase activity slowly.  Discussed icing regimen.  Follow-up again 6 to 8 weeks otherwise.  Can be a candidate for viscosupplementation.  Patient wants to avoid surgical intervention if possible.      Update  Robin Harrington is a 59 y.o. female coming in with complaint of B knee pain. Patient states   Onset-  Location Duration-  Character- Aggravating factors- Reliving factors-  Therapies tried-  Severity-     Past Medical History:  Diagnosis Date   Acne    Allergy    Hyperlipidemia    No past surgical history on file. Social History   Socioeconomic History   Marital status: Divorced    Spouse name: Not on file   Number of children: Not on file   Years of education: Not on file   Highest education level: Master's degree (e.g., MA, MS, MEng, MEd, MSW, MBA)  Occupational History   Not on file  Tobacco Use   Smoking status: Never   Smokeless tobacco: Never  Vaping Use   Vaping status: Never Used  Substance and Sexual Activity   Alcohol use: No   Drug use: No   Sexual activity: Not on file  Other Topics Concern   Not on file  Social History Narrative   Not on file   Social Drivers of Health   Tobacco Use: Low Risk (09/12/2024)   Patient History    Smoking Tobacco Use: Never    Smokeless Tobacco Use: Never    Passive Exposure: Not on file  Financial Resource Strain: Low Risk (03/16/2024)   Overall Financial Resource Strain (CARDIA)    Difficulty of Paying Living Expenses: Not very hard  Food Insecurity: No Food Insecurity  (03/16/2024)   Hunger Vital Sign    Worried About Running Out of Food in the Last Year: Never true    Ran Out of Food in the Last Year: Never true  Transportation Needs: No Transportation Needs (03/16/2024)   PRAPARE - Administrator, Civil Service (Medical): No    Lack of Transportation (Non-Medical): No  Physical Activity: Sufficiently Active (03/16/2024)   Exercise Vital Sign    Days of Exercise per Week: 5 days    Minutes of Exercise per Session: 60 min  Stress: No Stress Concern Present (03/16/2024)   [employer] of Occupational Health - Occupational Stress Questionnaire    Feeling of Stress : Not at all  Social Connections: Moderately Integrated (03/16/2024)   Social Connection and Isolation Panel    Frequency of Communication with Friends and Family: More than three times a week    Frequency of Social Gatherings with Friends and Family: Once a week    Attends Religious Services: More than 4 times per year    Active Member of Clubs or Organizations: Yes    Attends Banker Meetings: More than 4 times per year    Marital Status: Divorced  Depression (PHQ2-9): Low Risk (09/12/2024)   Depression (PHQ2-9)    PHQ-2 Score: 2  Alcohol Screen: Low Risk (03/16/2024)   Alcohol  Screen    Last Alcohol Screening Score (AUDIT): 1  Housing: Low Risk (03/16/2024)   Housing Stability Vital Sign    Unable to Pay for Housing in the Last Year: No    Number of Times Moved in the Last Year: 0    Homeless in the Last Year: No  Utilities: Not on file  Health Literacy: Not on file   Allergies[1] Family History  Problem Relation Age of Onset   Hypertension Mother    Atrial fibrillation Mother    Cancer Mother        melanoma    Cancer Father        colon    Heart disease Father    Breast cancer Neg Hx     Current Outpatient Medications (Cardiovascular):    rosuvastatin  (CRESTOR ) 40 MG tablet, Take 1 tablet (40 mg total) by mouth daily.  Current Outpatient  Medications (Analgesics):    diclofenac  (VOLTAREN ) 75 MG EC tablet, Take 75 mg by mouth 2 (two) times daily.  Current Outpatient Medications (Other):    omeprazole  (PRILOSEC) 40 MG capsule, Take 1 capsule (40 mg total) by mouth daily.   valACYclovir  (VALTREX ) 1000 MG tablet, Take 1 tablet (1,000 mg total) by mouth daily.   Vitamin D , Ergocalciferol , (DRISDOL ) 1.25 MG (50000 UNIT) CAPS capsule, Take 1 capsule (50,000 Units total) by mouth every 7 (seven) days.   zolpidem  (AMBIEN ) 5 MG tablet, Take 1 tablet (5 mg total) by mouth at bedtime as needed. for sleep   Reviewed prior external information including notes and imaging from  primary care provider As well as notes that were available from care everywhere and other healthcare systems.  Past medical history, social, surgical and family history all reviewed in electronic medical record.  No pertanent information unless stated regarding to the chief complaint.   Review of Systems:  No headache, visual changes, nausea, vomiting, diarrhea, constipation, dizziness, abdominal pain, skin rash, fevers, chills, night sweats, weight loss, swollen lymph nodes, body aches, joint swelling, chest pain, shortness of breath, mood changes. POSITIVE muscle aches  Objective  There were no vitals taken for this visit.   General: No apparent distress alert and oriented x3 mood and affect normal, dressed appropriately.  HEENT: Pupils equal, extraocular movements intact  Respiratory: Patient's speak in full sentences and does not appear short of breath  Cardiovascular: No lower extremity edema, non tender, no erythema    After informed written and verbal consent, patient was seated on exam table. Right knee was prepped with alcohol swab and utilizing anterolateral approach, patient's right knee space was injected with 48 mg per 3 mL of Monovisc (sodium hyaluronate) in a prefilled syringe was injected easily into the knee through a 22-gauge needle..Patient  tolerated the procedure well without immediate complications.  After informed written and verbal consent, patient was seated on exam table. Left knee was prepped with alcohol swab and utilizing anterolateral approach, patient's left knee space was injected with 40 mg per 3 mL of Monovisc (sodium hyaluronate) in a prefilled syringe was injected easily into the knee through a 22-gauge needle..Patient tolerated the procedure well without immediate complications.    Impression and Recommendations:           [1] No Known Allergies  "

## 2024-11-07 ENCOUNTER — Ambulatory Visit: Admitting: Family Medicine

## 2024-11-08 ENCOUNTER — Ambulatory Visit: Admitting: Family Medicine

## 2024-11-17 ENCOUNTER — Ambulatory Visit: Admitting: Family Medicine

## 2024-11-17 VITALS — BP 109/62 | HR 58 | Temp 97.9°F | Ht 65.0 in | Wt 179.0 lb

## 2024-11-17 DIAGNOSIS — Z01818 Encounter for other preprocedural examination: Secondary | ICD-10-CM

## 2024-11-17 DIAGNOSIS — M26609 Unspecified temporomandibular joint disorder, unspecified side: Secondary | ICD-10-CM

## 2024-11-17 DIAGNOSIS — M170 Bilateral primary osteoarthritis of knee: Secondary | ICD-10-CM | POA: Diagnosis not present

## 2024-11-17 DIAGNOSIS — F5109 Other insomnia not due to a substance or known physiological condition: Secondary | ICD-10-CM

## 2024-11-17 DIAGNOSIS — E782 Mixed hyperlipidemia: Secondary | ICD-10-CM | POA: Diagnosis not present

## 2024-11-17 LAB — BAYER DCA HB A1C WAIVED: HB A1C (BAYER DCA - WAIVED): 5.4 % (ref 4.8–5.6)

## 2024-11-17 LAB — COAGUCHEK XS/INR WAIVED
INR: 1 (ref 0.9–1.1)
Prothrombin Time: 12.2 s

## 2024-11-17 MED ORDER — ZOLPIDEM TARTRATE 5 MG PO TABS: 5.0000 mg | ORAL_TABLET | Freq: Every evening | ORAL | 0 refills | Status: AC | PRN

## 2024-11-17 MED ORDER — CYCLOBENZAPRINE HCL 10 MG PO TABS: 10.0000 mg | ORAL_TABLET | Freq: Three times a day (TID) | ORAL | 0 refills | Status: AC | PRN

## 2024-11-17 NOTE — Progress Notes (Signed)
 "  Pt is a 59 y.o. female who is here for preoperative clearance for total left knee arthroplasty. Will have total right knee arthroplasty 4 weeks later.   Essentially fasting today for repeat lipid panel.   Requesting flexeril  refill for TMJ. Needs a new mouth guard and has had increased symptoms from clenching jaw at night.   Also requesting refill of ambien  to have on hand for upcoming travel. Last fill was in June of 2025. Has insomnia related to traveling.   1) High Risk Cardiac Conditions  1) Recent MI - No.  2) Decompensated Heart Failure - No.  3) Unstable angina - No.  4) Symptomatic arrythmia - No.  5) Sx Valvular Disease - No.  2) Intermediate Risk Factors - DM, CKD, CVA, CHF, CAD - No.  2) Functional Status - > 4 mets (Walk, run, climb stairs) Yes.  SABRA Hails Activity Status Index: 58.2  3) Surgery Specific Risk -  Intermediate (Carotid, Head and Neck, Orthopaedic )          4) Further Noninvasive evaluation -   1) EKG - No.   1) Hx of CVA, CAD, DM, CKD  2) Echo - No.   1) Worsening dyspnea   3) Stress Testing - Active Cardiac Disease - No.  5) Need for medical therapy - Beta Blocker, Statins indicated ? No.  PE: Vitals:   11/17/24 1412  BP: 109/62  Pulse: (!) 58  Temp: 97.9 F (36.6 C)  SpO2: 100%   Physical Examination: General appearance - alert, well appearing, and in no distress Mental status - alert, oriented to person, place, and time Eyes - pupils equal and reactive, extraocular eye movements intact Ears - bilateral TM's and external ear canals normal Nose - normal and patent, no erythema, discharge or polyps Mouth - mucous membranes moist, pharynx normal without lesions Neck - supple, no significant adenopathy Chest - clear to auscultation, no wheezes, rales or rhonchi, symmetric air entry Heart - normal rate, regular rhythm, normal S1, S2, no murmurs, rubs, clicks or gallops Abdomen - soft, nontender, nondistended, no masses or  organomegaly Neurological - alert, oriented, normal speech, no focal findings or movement disorder noted Musculoskeletal - no joint tenderness, deformity or swelling Extremities - peripheral pulses normal, no pedal edema, no clubbing or cyanosis Skin - normal coloration and turgor, no rashes, no suspicious skin lesions noted   Robin Harrington was seen today for pre-op exam.  Diagnoses and all orders for this visit:  Pre-op exam -     CMP14+EGFR -     CBC with Differential/Platelet -     Bayer DCA Hb A1c Waived -     CoaguChek XS/INR Waived  Primary osteoarthritis of both knees  Mixed hyperlipidemia -     Lipid Panel  TMJ dysfunction -     cyclobenzaprine  (FLEXERIL ) 10 MG tablet; Take 1 tablet (10 mg total) by mouth 3 (three) times daily as needed for muscle spasms.  Situational insomnia -     zolpidem  (AMBIEN ) 5 MG tablet; Take 1 tablet (5 mg total) by mouth at bedtime as needed. for sleep    I have independently evaluated patient.  Robin Harrington is a 59 y.o. female who is low risk for a intermediate risk surgery.  There are/ are not modifiable risk factors (smoking, etc). Robin Harrington's RCRI/NSQIP calculation for MACE is: 0/0.5%.    NO CXR (if asx/healthy/no resp issues don't need) NO EKG (?h/o MI, CAD, etc; usually don't need for  low risk sx) NO PFTs (significant cardiopulm hx? OSA/OHS) NO Echo (get if CHF and has not had in >1 year OR if worse HF symptoms) Review meds: NO ACE-I/ARB day of surgery. OK to do BB or statin day of esp if vascular sx)  Robin Harrington is cleared for surgery.    Robin Search, FNP [facility]  "

## 2024-11-18 ENCOUNTER — Ambulatory Visit: Payer: Self-pay | Admitting: Family Medicine

## 2024-11-18 ENCOUNTER — Encounter: Payer: Self-pay | Admitting: Family Medicine

## 2024-11-18 LAB — CMP14+EGFR
ALT: 14 [IU]/L (ref 0–32)
AST: 17 [IU]/L (ref 0–40)
Albumin: 4.3 g/dL (ref 3.8–4.9)
Alkaline Phosphatase: 64 [IU]/L (ref 49–135)
BUN/Creatinine Ratio: 18 (ref 9–23)
BUN: 14 mg/dL (ref 6–24)
Bilirubin Total: <0.2 mg/dL (ref 0.0–1.2)
CO2: 24 mmol/L (ref 20–29)
Calcium: 9 mg/dL (ref 8.7–10.2)
Chloride: 103 mmol/L (ref 96–106)
Creatinine, Ser: 0.79 mg/dL (ref 0.57–1.00)
Globulin, Total: 2.1 g/dL (ref 1.5–4.5)
Glucose: 87 mg/dL (ref 70–99)
Potassium: 4 mmol/L (ref 3.5–5.2)
Sodium: 141 mmol/L (ref 134–144)
Total Protein: 6.4 g/dL (ref 6.0–8.5)
eGFR: 87 mL/min/{1.73_m2}

## 2024-11-18 LAB — CBC WITH DIFFERENTIAL/PLATELET
Basophils Absolute: 0 10*3/uL (ref 0.0–0.2)
Basos: 0 %
EOS (ABSOLUTE): 0.1 10*3/uL (ref 0.0–0.4)
Eos: 2 %
Hematocrit: 39.4 % (ref 34.0–46.6)
Hemoglobin: 13.1 g/dL (ref 11.1–15.9)
Immature Grans (Abs): 0 10*3/uL (ref 0.0–0.1)
Immature Granulocytes: 0 %
Lymphocytes Absolute: 1.6 10*3/uL (ref 0.7–3.1)
Lymphs: 30 %
MCH: 28.3 pg (ref 26.6–33.0)
MCHC: 33.2 g/dL (ref 31.5–35.7)
MCV: 85 fL (ref 79–97)
Monocytes Absolute: 0.4 10*3/uL (ref 0.1–0.9)
Monocytes: 7 %
Neutrophils Absolute: 3.2 10*3/uL (ref 1.4–7.0)
Neutrophils: 61 %
Platelets: 308 10*3/uL (ref 150–450)
RBC: 4.63 x10E6/uL (ref 3.77–5.28)
RDW: 13.4 % (ref 11.7–15.4)
WBC: 5.3 10*3/uL (ref 3.4–10.8)

## 2024-11-18 LAB — LIPID PANEL
Chol/HDL Ratio: 3.1 ratio (ref 0.0–4.4)
Cholesterol, Total: 214 mg/dL — ABNORMAL HIGH (ref 100–199)
HDL: 68 mg/dL
LDL Chol Calc (NIH): 123 mg/dL — ABNORMAL HIGH (ref 0–99)
Triglycerides: 131 mg/dL (ref 0–149)
VLDL Cholesterol Cal: 23 mg/dL (ref 5–40)

## 2024-12-02 ENCOUNTER — Ambulatory Visit: Admitting: Family Medicine
